# Patient Record
Sex: Female | Born: 1935 | Race: Black or African American | Hispanic: No | Marital: Married
Health system: Southern US, Community
[De-identification: ages and names within clinical notes are randomized; demographics above are authoritative.]

## PROBLEM LIST (undated history)

## (undated) ENCOUNTER — Emergency Department (HOSPITAL_COMMUNITY): Admission: EM | Payer: Medicare PPO

## (undated) DIAGNOSIS — I10 Essential (primary) hypertension: Secondary | ICD-10-CM

## (undated) DIAGNOSIS — E119 Type 2 diabetes mellitus without complications: Secondary | ICD-10-CM

## (undated) DIAGNOSIS — I480 Paroxysmal atrial fibrillation: Secondary | ICD-10-CM

## (undated) DIAGNOSIS — B029 Zoster without complications: Secondary | ICD-10-CM

## (undated) DIAGNOSIS — H409 Unspecified glaucoma: Secondary | ICD-10-CM

## (undated) DIAGNOSIS — K5792 Diverticulitis of intestine, part unspecified, without perforation or abscess without bleeding: Secondary | ICD-10-CM

## (undated) DIAGNOSIS — E7800 Pure hypercholesterolemia, unspecified: Secondary | ICD-10-CM

## (undated) HISTORY — PX: ABDOMINAL HYSTERECTOMY: SHX81

## (undated) HISTORY — DX: Unspecified glaucoma: H40.9

## (undated) HISTORY — DX: Paroxysmal atrial fibrillation: I48.0

## (undated) HISTORY — PX: COLONOSCOPY: SHX174

---

## 1997-11-07 ENCOUNTER — Ambulatory Visit (HOSPITAL_COMMUNITY): Admission: RE | Admit: 1997-11-07 | Discharge: 1997-11-07 | Payer: Self-pay | Admitting: Gastroenterology

## 1998-06-19 ENCOUNTER — Other Ambulatory Visit: Admission: RE | Admit: 1998-06-19 | Discharge: 1998-06-19 | Payer: Self-pay | Admitting: Family Medicine

## 1999-10-08 ENCOUNTER — Encounter (INDEPENDENT_AMBULATORY_CARE_PROVIDER_SITE_OTHER): Payer: Self-pay | Admitting: *Deleted

## 1999-10-08 ENCOUNTER — Encounter: Payer: Self-pay | Admitting: Family Medicine

## 1999-10-08 ENCOUNTER — Encounter: Admission: RE | Admit: 1999-10-08 | Discharge: 1999-10-08 | Payer: Self-pay | Admitting: Family Medicine

## 1999-11-03 ENCOUNTER — Other Ambulatory Visit: Admission: RE | Admit: 1999-11-03 | Discharge: 1999-11-03 | Payer: Self-pay | Admitting: Endocrinology

## 2000-07-23 ENCOUNTER — Ambulatory Visit (HOSPITAL_COMMUNITY): Admission: RE | Admit: 2000-07-23 | Discharge: 2000-07-23 | Payer: Self-pay | Admitting: Endocrinology

## 2000-07-23 ENCOUNTER — Encounter: Payer: Self-pay | Admitting: Endocrinology

## 2000-08-06 ENCOUNTER — Encounter (INDEPENDENT_AMBULATORY_CARE_PROVIDER_SITE_OTHER): Payer: Self-pay | Admitting: *Deleted

## 2000-08-06 ENCOUNTER — Ambulatory Visit (HOSPITAL_COMMUNITY): Admission: RE | Admit: 2000-08-06 | Discharge: 2000-08-06 | Payer: Self-pay | Admitting: Endocrinology

## 2000-08-06 ENCOUNTER — Encounter: Payer: Self-pay | Admitting: Endocrinology

## 2001-08-05 ENCOUNTER — Ambulatory Visit (HOSPITAL_COMMUNITY): Admission: RE | Admit: 2001-08-05 | Discharge: 2001-08-05 | Payer: Self-pay | Admitting: Endocrinology

## 2001-08-05 ENCOUNTER — Encounter: Payer: Self-pay | Admitting: Endocrinology

## 2002-05-11 ENCOUNTER — Ambulatory Visit (HOSPITAL_COMMUNITY): Admission: RE | Admit: 2002-05-11 | Discharge: 2002-05-11 | Payer: Self-pay | Admitting: Family Medicine

## 2002-05-11 ENCOUNTER — Encounter: Payer: Self-pay | Admitting: Family Medicine

## 2003-04-14 HISTORY — PX: TOTAL HIP ARTHROPLASTY: SHX124

## 2003-08-16 ENCOUNTER — Inpatient Hospital Stay (HOSPITAL_COMMUNITY): Admission: RE | Admit: 2003-08-16 | Discharge: 2003-08-22 | Payer: Self-pay | Admitting: Orthopedic Surgery

## 2003-09-27 ENCOUNTER — Encounter: Admission: RE | Admit: 2003-09-27 | Discharge: 2003-11-22 | Payer: Self-pay | Admitting: Orthopedic Surgery

## 2005-04-15 ENCOUNTER — Encounter: Admission: RE | Admit: 2005-04-15 | Discharge: 2005-04-15 | Payer: Self-pay | Admitting: Family Medicine

## 2006-08-10 ENCOUNTER — Encounter: Admission: RE | Admit: 2006-08-10 | Discharge: 2006-08-10 | Payer: Self-pay | Admitting: Family Medicine

## 2008-03-23 ENCOUNTER — Inpatient Hospital Stay (HOSPITAL_COMMUNITY): Admission: AD | Admit: 2008-03-23 | Discharge: 2008-03-26 | Payer: Self-pay | Admitting: Internal Medicine

## 2008-03-24 ENCOUNTER — Ambulatory Visit: Payer: Self-pay | Admitting: Internal Medicine

## 2008-03-26 ENCOUNTER — Encounter (INDEPENDENT_AMBULATORY_CARE_PROVIDER_SITE_OTHER): Payer: Self-pay | Admitting: Gastroenterology

## 2008-04-03 ENCOUNTER — Encounter: Admission: RE | Admit: 2008-04-03 | Discharge: 2008-04-03 | Payer: Self-pay | Admitting: Family Medicine

## 2010-04-09 ENCOUNTER — Ambulatory Visit (HOSPITAL_COMMUNITY)
Admission: RE | Admit: 2010-04-09 | Discharge: 2010-04-09 | Payer: Self-pay | Source: Home / Self Care | Attending: Ophthalmology | Admitting: Ophthalmology

## 2010-04-22 ENCOUNTER — Ambulatory Visit (HOSPITAL_COMMUNITY)
Admission: RE | Admit: 2010-04-22 | Discharge: 2010-04-22 | Payer: Medicare Other | Source: Home / Self Care | Attending: Ophthalmology | Admitting: Ophthalmology

## 2010-05-30 ENCOUNTER — Other Ambulatory Visit (HOSPITAL_COMMUNITY): Payer: Self-pay | Admitting: Ophthalmology

## 2010-05-30 ENCOUNTER — Ambulatory Visit (HOSPITAL_COMMUNITY)
Admission: RE | Admit: 2010-05-30 | Discharge: 2010-05-30 | Disposition: A | Discharge status: Home / Self Care | Payer: Medicare Other | Source: Ambulatory Visit | Attending: Ophthalmology | Admitting: Ophthalmology

## 2010-05-30 ENCOUNTER — Encounter (HOSPITAL_COMMUNITY)
Admission: RE | Admit: 2010-05-30 | Discharge: 2010-05-30 | Disposition: A | Discharge status: Home / Self Care | Payer: Medicare Other | Source: Ambulatory Visit | Attending: Ophthalmology | Admitting: Ophthalmology

## 2010-05-30 DIAGNOSIS — R079 Chest pain, unspecified: Secondary | ICD-10-CM | POA: Insufficient documentation

## 2010-05-30 DIAGNOSIS — I10 Essential (primary) hypertension: Secondary | ICD-10-CM | POA: Insufficient documentation

## 2010-05-30 DIAGNOSIS — Z01818 Encounter for other preprocedural examination: Secondary | ICD-10-CM | POA: Insufficient documentation

## 2010-05-30 LAB — BASIC METABOLIC PANEL
BUN: 12 mg/dL (ref 6–23)
CO2: 31 mEq/L (ref 19–32)
Calcium: 9.8 mg/dL (ref 8.4–10.5)
Chloride: 103 mEq/L (ref 96–112)
Creatinine, Ser: 0.8 mg/dL (ref 0.4–1.2)
GFR calc Af Amer: >60 mL/min (ref >60)
GFR calc non Af Amer: >60 mL/min (ref >60)
Glucose, Bld: 143 mg/dL — ABNORMAL HIGH (ref 70–99)
Potassium: 4.1 mEq/L (ref 3.5–5.1)
Sodium: 141 mEq/L (ref 135–145)

## 2010-05-30 LAB — CBC
HCT: 39.8 % (ref 36.0–46.0)
Hemoglobin: 14.3 g/dL (ref 12.0–15.0)
MCH: 33.3 pg (ref 26.0–34.0)
MCHC: 35.9 g/dL (ref 30.0–36.0)
MCV: 92.8 fL (ref 78.0–100.0)
Platelets: 162 10*3/uL (ref 150–400)
RBC: 4.29 MIL/uL (ref 3.87–5.11)
RDW: 12.3 % (ref 11.5–15.5)
WBC: 5.7 10*3/uL (ref 4.0–10.5)

## 2010-06-04 ENCOUNTER — Ambulatory Visit (HOSPITAL_COMMUNITY)
Admission: RE | Admit: 2010-06-04 | Discharge: 2010-06-04 | Disposition: A | Discharge status: Home / Self Care | Payer: Medicare Other | Source: Ambulatory Visit | Attending: Ophthalmology | Admitting: Ophthalmology

## 2010-06-04 DIAGNOSIS — H269 Unspecified cataract: Secondary | ICD-10-CM | POA: Insufficient documentation

## 2010-06-04 DIAGNOSIS — I10 Essential (primary) hypertension: Secondary | ICD-10-CM | POA: Insufficient documentation

## 2010-06-04 LAB — GLUCOSE, CAPILLARY
Glucose-Capillary: 110 mg/dL — ABNORMAL HIGH (ref 70–99)
Glucose-Capillary: 94 mg/dL (ref 70–99)
Glucose-Capillary: 111 mg/dL — ABNORMAL HIGH (ref 70–99)

## 2010-07-14 NOTE — Op Note (Signed)
[name], [name]                ACCOUNT NO.:  [id number]  MEDICAL RECORD NO.:  [id number]           PATIENT TYPE:  O  LOCATION:  SDSC                         FACILITY:  [facility]  PHYSICIAN:  Chalmers Guest, M.D.     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  There was a note that the patient's pupil was poorly dilated making it a complex cataract.  PROCEDURE:  Phacoemulsification with intraocular lens implant with devices to stretch the iris and open the bag.  COMPLICATIONS:  None.  ANESTHESIA:  Consisted of 2% Xylocaine and a 50/50 mixture with 0.75% Marcaine with an ampule of Wydase.  PROCEDURE:  The patient was given a peribulbar block in the operative suite under monitored anesthesia with the aforementioned local anesthetic agent.  Pressure was applied to the eye.  Following this, the patient's face was prepped and draped in the usual sterile fashion. With the lid speculum inserted and the surgeon sitting temporally and operating the microscope into position, a Weck-Cel sponge was used to fixate the globe and a 15-degree blade was used to enter the inferior temporal clear cornea.  Viscoat was injected in the eye.  Following this, an additional Weck-Cel sponge was used to fixate the eye and a 2.75-mm keratome blade was used in a stepwise fashion through temporal clear cornea.  A bent 25-gauge needle was used to incise the anterior capsule and a continuous tear curvilinear capsulorrhexis was formed. Utrata forceps were used to remove the anterior capsule.  BSS was used to hydrodissect and hydrodelineate the nucleus.  The phacoemulsification unit was then used to sculpt the nucleus.  The  nucleus was snapped into three quadrants and one quadrant easily was removed.  Following this, the remaining nuclear quadrant was removed to quadrant beneath the iris. It was difficult to see when the pupil constricted down.  A heavy viscoelastic was then injected to keep the pupillary margin open.  The remaining nuclear fragment was then removed and then the IA was called forward.  The IA was used to strip cortical fibers.  This as the posterior capsular bag was made clear, a line was noted in the capsular bag that appeared to be a tear in the posterior capsule.  Viscoelastic was injected.  Incision was checked for vitreous, there was no vitreous. An angle tip IA was then used to remove sub-incisional cortex.  At this point, it was clear that the posterior capsular bag was intact, but the capsulorrhexis was not continuously intact at this point.  Therefore, the AMO lens was chosen for insertion in the ciliary sulcus.  The lens was an AMO model V9002 lens, 20.5 diopter lens.  The lens was folded and that there was no cartridge available to place it in the lens injector. Therefore, it was folded with the forceps.  The incision was extended to 3.5 mm.  The lens was inserted  and then placed in the pupillary margin. The Sinskey hook was used to position the lens in the ciliary sulcus and the lens was well centered.  The IA was used to remove viscoelastic from the eye.  The pupil at this point again was down to about 3-4 mm.  With the lens in good position, a single 10-0 nylon suture was placed to achieve watertight closure.  The eye was pressurized and there being no leakage, all instrumentation removed from the eye.  TobraDex ointment was applied to the eye.  Patch and Fox shield were placed and the patient returned to recovery area in stable condition.     Chalmers Guest, M.D.     RW/MEDQ  D:  06/04/2010  T:  06/05/2010  Job:  [id number]  Electronically Signed by Chalmers Guest M.D. on  07/14/2010 02:03:24 PM

## 2010-08-26 NOTE — Discharge Summary (Signed)
Hannah, [name]                ACCOUNT NO.:  [id number]   MEDICAL RECORD NO.:  [id number]          PATIENT TYPE:  INP   LOCATION:  1432                         FACILITY:  [facility]   PHYSICIAN:  Monte Fantasia, MD  DATE OF BIRTH:  12-Nov-1928   DATE OF ADMISSION:  03/23/2008  DATE OF DISCHARGE:                               DISCHARGE SUMMARY   DISCHARGE DIAGNOSES:  1. Bright red blood per rectum.  2. Status post colonoscopy, possibly diverticular bleed.  3. Hypertension.  4. Degenerative disease.  5. Diabetes.  6. Glaucoma.  7. Hyperlipidemia.  8. History of chronic anemia.   DISCHARGE MEDICATIONS:  1. Januvia 100 mg p.o. daily.  2. Simvastatin 20 mg p.o. daily, metformin 500 mg p.o. twice daily.  3. Norvasc 5 mg daily.  4. Travatan .004% one drop each eye at bed time.  5. Alphagan 0.2% both eyes one drop twice daily.   COURSE DURING THE HOSPITAL STAY:  A 77 year old African American female  patient presented to primary care physician's office with bright red  blood per rectum.  The patient did not have any complaints of dizziness  upon ambulation.  No complaints of abdominal pain.  The patient also had  history of passing black tarry stools.  The patient was admitted and  monitored for her H&H which remained stable.  Underwent a colonoscopy on  December 14 which showed a sessile polyp, diverticuli and medium  hemorrhoids.  Biopsy was done with colonoscopy and the patient is  recommended to follow up with Dr. Loreta Ave in 1-2 weeks.  The patient is  stable to be discharged and can be discharged home today.   RADIOLOGY:  Investigations done during hospital stay is none.   DISCHARGE LABORATORIES:  Total WBC 5.8, hemoglobin 10.8, hematocrit  32.1, platelet of 136, sodium 140, potassium 3.7, chloride 109, bicarb  25, glucose 128, BUN 9, creatinine 0.6, magnesium 2.1, phosphorus 4.3.   ASSESSMENT AND PLAN:  Plan to discharge the patient today and will  recommend her to follow up  with her primary care physician in one week  and with GI, Dr. Loreta Ave in 1-2 weeks.     Monte Fantasia, MD  Electronically Signed    MP/MEDQ  D:  03/26/2008  T:  03/26/2008  Job:  [id number]

## 2010-08-26 NOTE — H&P (Signed)
[name], [name]                ACCOUNT NO.:  [id number]   MEDICAL RECORD NO.:  [id number]          PATIENT TYPE:  INP   LOCATION:  1432                         FACILITY:  [facility]   PHYSICIAN:  Lucita Ferrara, MD         DATE OF BIRTH:  1928/05/30   DATE OF ADMISSION:  03/23/2008  DATE OF DISCHARGE:                              HISTORY & PHYSICAL   CHIEF COMPLAINT:  Rectal bleeding.  The patient is a 77 year old African  American female who presented to primary care physician's office with  one episodes of bright red blood per rectum.  The patient did not have  any dizziness upon ambulation.  The patient denies any abdominal pain.  The patient has had colonoscopy 7 years ago, routine, which was negative  for polyps.  The patient denies excessive use of anti-inflammatory  medication, NSAIDS.  She routinely takes aspirin 81 mg daily.  She  denies nausea, vomiting, hematemesis.  She denies rectal pain.  She was  sent here after Dr. Parke Simmers performed rectal exam and found significant  amount of blood per rectum.  There were no hemorrhoids noted.   Otherwise the patient's review of systems negative.  She denies fevers,  chills, abdominal pain, diarrhea.   PAST MEDICAL HISTORY:  1. Significant for type 2 diabetes.  2. Hypertension.  3. Degenerative joint disease.  4. Hypercholesteremia.  5. Documented history of chronic anemia.  6. Glaucoma.   PREVIOUS OPERATIONS:  Status post hysterectomy.   ALLERGIES:  PENICILLIN.   SOCIAL HISTORY:  The patient lives with her husband.  Social drinker.  Denies drugs or alcohol.   MEDICATIONS:  Are yet to be consolidated at this point.Marland Kitchen   FAMILY HISTORY:  Noncontributory.   PHYSICAL EXAMINATION:  GENERAL:  Generally speaking, she is in no acute  distress.  VITAL SIGNS:  Temperature 98, pulse 73, respirations 18, blood pressure  161/83, pulse ox 99% on room air.  HEENT: Normocephalic, atraumatic.  Sclerae anicteric.  Neck supple.  No  JVD, no  carotid bruits.  Extraocular muscles intact.  CARDIOVASCULAR:  S1, S2, regular rate and rhythm.  No murmurs, rubs or  clicks.  LUNGS:  Clear to auscultation bilaterally, without rales or wheezes.  ABDOMEN:  Soft, nontender, nondistended.  Positive bowel sounds.  EXTREMITIES:  No weakness or edema.  RECTAL:  Stool black.  No hemorrhoids noted.  Guaiac positive.   LABORATORY DATA:  Laboratory data pending.  Complete metabolic panel  pending.  Electrolytes pending.  Radiological data is pending.  Note,  the patient was direct admission.   DISCUSSION:  The patient is 77 year old with:   1  Bright red blood per rectum.  No rectal pain.  No abdominal pain.  Sent here from primary care physician's office.  No laboratory data.  1. History of chronic anemia, history of negative colonoscopy 7 years      ago, per patient.  2. Hypertension.  3. Degenerative joint disease.  4. Diabetes, type 2.  5. Glaucoma.  6. Hyperlipidemia.   Patient will be admitted to the medical telemetry unit.  If patient's  hemoglobin has significantly dropped or the patient is hemodynamically  unstable, there will be a transition to step-down unit.  The patient  will be initiated on anemia workup.  Anemia panel, stool for occult  blood x3, hemoglobin and hematocrit every 6 hours x24 hours.  The  patient will be typed and crossed and 1 unit of packed red blood cells  will be held.  Transfuse as needed.  GI consultation will proceed.  The  patient will be initiated on Protonix 40 mg IV twice daily.  Avoid  nonsteroidal anti-inflammatory medications.  For hypertension we will continue blood pressure medications by the  patient's son.  For diabetes we will hold metformin and will initiate  sliding scale insulin.  For glaucoma, she needs to bring her eye drops  in.  DVT and GI prophylaxis routine.We will hold any Lovenox until  source of GI bleed has been delineated.  The rest of pituitary rongeurs  plans are dependent  on her progress and consultant recommendations.      Lucita Ferrara, MD  Electronically Signed     RR/MEDQ  D:  03/23/2008  T:  03/23/2008  Job:  [id number]

## 2010-08-26 NOTE — Consult Note (Signed)
[name], [name]                ACCOUNT NO.:  [id number]   MEDICAL RECORD NO.:  [id number]          PATIENT TYPE:  INP   LOCATION:  1432                         FACILITY:  [facility]   PHYSICIAN:  Jordan Hawks. Elnoria Howard, MD    DATE OF BIRTH:  1929/02/21   DATE OF CONSULTATION:  03/23/2008  DATE OF DISCHARGE:                                 CONSULTATION   PRIMARY CARE [facility]:  Renaye Rakers, M.D.   HISTORY OF PRESENT ILLNESS:  This is a 77 year old female with a past  medical history of hypertension, diabetes, history of chronic anemia,  hyperlipidemia, DJD, status post right hip replacement, glaucoma, and a  hysterectomy who was admitted to the hospital with complaints of  hematochezia.  The patient reports that it started yesterday morning.  She had one episode and subsequently did not have any further bleeding  at that time.  However this morning, she did have another episode of  bleeding and she thought it had more blood with her stool during this  event.  Subsequently, she presented to Dr. Parke Simmers for further evaluation  and treatment.  The rectal examination performed and did reveal maroon  stool.  Then she was recommended to present to the hospital for further  evaluation and treatment.  The patient denies having any abdominal pain,  proctalgia, nausea, vomiting or diarrhea.  She was in her usual state of  health before this event.  In 1999, she underwent a colonoscopy by Dr.  Loreta Ave with findings of left-sided diverticulosis and hemorrhoids.   PAST MEDICAL/SURGICAL HISTORY:  As stated above.   FAMILY HISTORY:  Noncontributory.   SOCIAL HISTORY:  The patient lives with her husband.  Occasional  alcohol.  No tobacco or illicit drug use.   REVIEW OF SYSTEMS:  As stated above in the history of present illness,  otherwise negative.   ALLERGIES:  PENICILLIN.   MEDICATIONS:  See the inpatient medicine list.   PHYSICAL EXAMINATION:  VITAL SIGNS:  Pending at this time.  GENERAL:  The  patient is in no acute distress, alert and oriented.  HEENT:  Normocephalic, atraumatic.  Extraocular muscles intact.  NECK:  Supple.  No lymphadenopathy.  LUNGS:  Clear to auscultation bilaterally.  CARDIOVASCULAR:  Regular rate and rhythm.  ABDOMEN:  Mildly obese, soft, nontender, nondistended.  Positive bowel  sounds.  EXTREMITIES:  No clubbing, cyanosis or edema.  RECTAL:  Significant for maroon stool.   LABORATORY DATA:  Laboratory values pending at this time.   IMPRESSION:  1. Diverticular bleed.  2. Multiple medical problems.   PLAN:  At the time, I feel that the patient's clinical history is  consistent with a diverticular bleed.  She is stable.  No complaints of  any chest pain, shortness breath or dizziness.  She only had 2 bowel  movements.  Overall, the patient appears be stable.  Plan is to monitor  her hemoglobin q.12 h. and then daily if the hemoglobin remains stable.  Transfusion can be provided if necessary.  If the bleeding does persist,  then a colonoscopy can be pursued at this time.  Jordan Hawks Elnoria Howard, MD  Electronically Signed     PDH/MEDQ  D:  03/23/2008  T:  03/24/2008  Job:  [id number]   cc:   Renaye Rakers, M.D.  Fax: [phone]

## 2010-08-29 NOTE — H&P (Signed)
NAME:  [name], [name]                          ACCOUNT NO.:  [id number]   MEDICAL RECORD NO.:  [id number]                   PATIENT TYPE:  INP   LOCATION:  5015                                 FACILITY:  [facility]   PHYSICIAN:  Myrtie Neither, M.D.                 DATE OF BIRTH:  May 02, 1928   DATE OF ADMISSION:  08/16/2003  DATE OF DISCHARGE:  08/22/2003                                HISTORY & PHYSICAL   CHIEF COMPLAINT:  Painful right hip.   HISTORY OF PRESENT ILLNESS:  This is a 77 year old black female who had been  followed for severe degenerative joint disease involving her right hip.  The  patient had been treated with anti-inflammatories, use of quad cane with  progressive worsening of pain, disability, and loss of function.   PAST MEDICAL HISTORY:  1. High blood pressure.  2. Degenerative joint disease.  3. Hypercholesterolemia.  4. Chronic anemia.  5. Diabetes mellitus.  6. Glaucoma.   OPERATIONS:  Hysterectomy.   ALLERGIES:  PENICILLIN.   SOCIAL HISTORY:  The patient lives with her husband.  Has occasional use of  alcohol.  Denies use of tobacco or smoking.   REVIEW OF SYSTEMS:  Basically, that of history of present illness.  Also,  degenerative joint pain in bilateral knees.  No cardiac or respiratory.  No  urinary or bowel symptoms.   MEDICATIONS:  1. Zocor 20 mg daily.  2. Norvasc 5 mg.  3. Uniretic 15/12.5 one-half tablet q.a.m.  4. Metformin ER 500 mg b.i.d.  5. __________ 5 mg q.a.m.  6. Percocet 5 mg.  7. Avandia 4 mg b.i.d.  8. Ultram q.6h. p.r.n.  9. Mobic 15 mg daily.  10.      Alphagan eye drops t.i.d.  11.      Xalatan 0.005 at bedtime.  12.      Multivitamin.  13.      Vitamin E.  14.      Ferrous sulfate.  15.      Garlic.  16.      Synthroid 0.1 mg.  17.      Cosopt eye drop b.i.d.   FAMILY HISTORY:  Noncontributory.   PHYSICAL EXAMINATION:  VITAL SIGNS:  Temperature 98.2, pulse 70,  respirations 18, blood pressure 139/72.  Height 59  inches.  Weight 141  pounds.  HEENT:  Normocephalic.  Sclerae are clear.  NECK:  Supple.  CHEST:  Clear.  CARDIAC:  S1 and S2 are regular.  EXTREMITIES:  The patient has marked limp on the right side with obvious  shortening of the right side.  Range of motion of the right hip is limited  by pain on attempt at rotation, as well as flexion, with palpable and  audible click on flexion of the hip.  Neurovascular status is intact.   X-ray reveals severe degenerative joint change involving the right femoral  head and acetabulum.  IMPRESSION:  1. Degenerative joint disease, right hip.  2. High blood pressure.  3. Hypercholesterolemia.  4. Diabetes mellitus.   PLAN:  Right total hip arthroplasty.                                                Myrtie Neither, M.D.    AC/MEDQ  D:  09/25/2003  T:  09/25/2003  Job:  [id number]

## 2010-08-29 NOTE — Discharge Summary (Signed)
NAME:  [name], [name]                          ACCOUNT NO.:  [id number]   MEDICAL RECORD NO.:  [id number]                   PATIENT TYPE:  INP   LOCATION:  5015                                 FACILITY:  [facility]   PHYSICIAN:  Myrtie Neither, M.D.                 DATE OF BIRTH:  [birth year]   DATE OF ADMISSION:  08/16/2003  DATE OF DISCHARGE:  08/22/2003                                 DISCHARGE SUMMARY   ADMISSION DIAGNOSES:  Severe degenerative arthropathy, right hip.   DISCHARGE DIAGNOSES:  Severe degenerative arthropathy, right hip.   COMPLICATIONS:  Infected central line tip with bacteremia.   OPERATION:  Right total hip arthroplasty.   PERTINENT HISTORY:  This is a 77 year old female who had been followed in  the office for degenerative joint disease with progressive worsening of pain  and dysfunction.  Pertinent physical was that of the right hip with shortening, limited range  of motion, tender on attempted range of motion. Neurovascular status was  intact.  X-rays revealed  loss of joint space with osteophytes and  sclerosis.   HOSPITAL COURSE:  The patient had preop medical evaluation by her primary  care physician and was found to be stable enough to undergo surgery by Renaye Rakers, M.D.  The patient had preop laboratories, CBC's, CMET, PT, PTT, EKG,  chest x-ray which were all stable enough for the patient to undergo surgery.  The patient underwent right total hip arthroplasty on Aug 16, 2003.  Postop  course had almost immediate spike of temperature. The patient's temperature  within 48 hours postop of 102, 103.  The patient had blood cultures done,  removal of the central line and culturing of the central line tip. The  patient's was already on prophylactic Ancef.  Culture reports revealed the  central line tip had staph, coagulase negative. The patient immediately  became afebrile within a two day period.  Infectious disease was consulted  and the patient was felt not to  be in danger of worsening of condition and  was afebrile and antibiotics were felt not to be necessary upon discharge.  The patient progressed very well with her physical therapy with very good  ambulatory skills. The patient was able to be discharged home with home  health physical therapy, Percocet for pain control, continued on Coumadin  and to return to her other general medical medications.  The patient was  discharged in stable and satisfactory condition and to return to the office  in one week.                                                Myrtie Neither, M.D.    AC/MEDQ  D:  10/16/2003  T:  10/17/2003  Job:  [id number]

## 2010-08-29 NOTE — Op Note (Signed)
NAME:  [name], [name]                          ACCOUNT NO.:  [id number]   MEDICAL RECORD NO.:  [id number]                   PATIENT TYPE:  INP   LOCATION:  5015                                 FACILITY:  [facility]   PHYSICIAN:  Myrtie Neither, M.D.                 DATE OF BIRTH:  Mar 15, 1929   DATE OF PROCEDURE:  08/16/2003  DATE OF DISCHARGE:  08/22/2003                                 OPERATIVE REPORT   PREOPERATIVE DIAGNOSES:  Severe degenerative arthritis, right hip.   POSTOPERATIVE DIAGNOSES:  Severe degenerative arthritis, right hip.   ANESTHESIA:  General.   PROCEDURE:  Right total hip arthroplasty Depuy.   DESCRIPTION OF PROCEDURE:  The patient was taken to the operating room after  given adequate preop medication. The patient was placed in the right lateral  position, right hip was prepped with Duraprep and draped in a sterile  manner.  A posterior southern approach was made over the hip going through  the skin and subcutaneous tissue down to the fascia lata, short rotators  were identified and released.  The hip was internally rotated, the capsule  was identified and resected. The femoral head was dislocated followed by  placement of the femoral neck cutting jig. This was outlined with the use of  a Bovie. The oscillating saw was used to resect the femoral head.  Next, the  acetabulum was exposed and further capsular tissue resected.  Reaming of the  acetabulum was done for a small size 52 mm after  adequate reaming down to  good bleeding bone. A trial component was put in place and felt to hold good  and stable.  Next attention was turned to the femoral neck.  Reaming on the  femoral canal was started with a cookie starter followed by the guide rasp  and reaming down the femoral canal for a size 12 implant.  After adequate  reaming, the rasp in place, femoral head and neck components were tried, a  32 mm head was plus 1 and was found to be good and stable with good full  flexion and extension, no telescoping, good internal and external rotation  and leg length.  The patient had previous leg length shortening on that side  and we were able to regain some of that.  Next copious and abundant  irrigation was done. Following placement of the components, the acetabular  cuff was small size 52 mm, porous coated. There was good stability.  This  was further stabilized with the use of cancellous screws, three screws.  Femoral component was 12 mm, small stature. This was hammered in place and  felt to have good grasping of the bone. A 10 degree acetabular liner was  then snapped in place and trial head and neck components were again tested  and the 32 mm plus 1 was found to be the most stable.  With the __________  in  place including a hole eliminator for the acetabulum, copious irrigation  was done, range of motion was found to be good with good stability and good  leg length.  Wound closure was then done with #0 Vicryl for the fascia, 2-0  for the subcutaneous and skin staples in the skin.  A pressure dressing was  applied, hip abduction pillows applied.  The patient tolerated the procedure  quite well and went to the recovery room in stable satisfactory condition.                                               Myrtie Neither, M.D.    AC/MEDQ  D:  10/16/2003  T:  10/17/2003  Job:  [id number]

## 2010-09-16 ENCOUNTER — Encounter (HOSPITAL_COMMUNITY)
Admission: RE | Admit: 2010-09-16 | Discharge: 2010-09-16 | Disposition: A | Discharge status: Home / Self Care | Payer: Medicare Other | Source: Ambulatory Visit | Attending: Ophthalmology | Admitting: Ophthalmology

## 2010-09-16 LAB — CBC
HCT: 38.5 % (ref 36.0–46.0)
Hemoglobin: 13.6 g/dL (ref 12.0–15.0)
MCH: 32.9 pg (ref 26.0–34.0)
MCHC: 35.3 g/dL (ref 30.0–36.0)
MCV: 93.2 fL (ref 78.0–100.0)
Platelets: 136 10*3/uL — ABNORMAL LOW (ref 150–400)
RBC: 4.13 MIL/uL (ref 3.87–5.11)
RDW: 12.6 % (ref 11.5–15.5)
WBC: 4.8 10*3/uL (ref 4.0–10.5)

## 2010-09-16 LAB — BASIC METABOLIC PANEL
BUN: 15 mg/dL (ref 6–23)
CO2: 29 mEq/L (ref 19–32)
Calcium: 9.6 mg/dL (ref 8.4–10.5)
Chloride: 103 mEq/L (ref 96–112)
Creatinine, Ser: 0.8 mg/dL (ref 0.4–1.2)
GFR calc Af Amer: >60 mL/min (ref >60)
GFR calc non Af Amer: >60 mL/min (ref >60)
Glucose, Bld: 191 mg/dL — ABNORMAL HIGH (ref 70–99)
Potassium: 4.5 mEq/L (ref 3.5–5.1)
Sodium: 142 mEq/L (ref 135–145)

## 2010-09-17 ENCOUNTER — Ambulatory Visit (HOSPITAL_COMMUNITY)
Admission: RE | Admit: 2010-09-17 | Discharge: 2010-09-17 | Disposition: A | Discharge status: Home / Self Care | Payer: Medicare Other | Source: Ambulatory Visit | Attending: Ophthalmology | Admitting: Ophthalmology

## 2010-09-17 DIAGNOSIS — I10 Essential (primary) hypertension: Secondary | ICD-10-CM | POA: Insufficient documentation

## 2010-09-17 DIAGNOSIS — H269 Unspecified cataract: Secondary | ICD-10-CM | POA: Insufficient documentation

## 2010-09-17 DIAGNOSIS — E119 Type 2 diabetes mellitus without complications: Secondary | ICD-10-CM | POA: Insufficient documentation

## 2010-09-17 DIAGNOSIS — Z01812 Encounter for preprocedural laboratory examination: Secondary | ICD-10-CM | POA: Insufficient documentation

## 2010-09-17 LAB — GLUCOSE, CAPILLARY
Glucose-Capillary: 146 mg/dL — ABNORMAL HIGH (ref 70–99)
Glucose-Capillary: 151 mg/dL — ABNORMAL HIGH (ref 70–99)

## 2010-10-01 NOTE — Op Note (Signed)
  [name], [name]                ACCOUNT NO.:  [id number]  MEDICAL RECORD NO.:  [id number]  LOCATION:  SDSC                         FACILITY:  [facility]  PHYSICIAN:  Shade Flood, MD       DATE OF BIRTH:  11/26/1928  DATE OF PROCEDURE:  09/17/2010 DATE OF DISCHARGE:  09/17/2010                              OPERATIVE REPORT   PREOPERATIVE DIAGNOSIS:  Cataract, right eye.  POSTOPERATIVE DIAGNOSIS:  Cataract, right eye.  PROCEDURE PERFORMED:  Phacoemulsification with posterior chamber intraocular lens, right eye.  The patient was prepared and draped in the usual fashion for ocular surgery on the right eye and a solid lid speculum was placed.  A peripheral clear corneal incision was made for 3.5 mm centered at the 11 o'clock meridian.  A separate stab incision was made at the 2:30 meridian of the clear cornea.  A keratome was used to make a self- sealing incision at the 11 o'clock meridian and then Provisc was instilled into the anterior chamber.  A bent 25-gauge needle was used to perform a capsulorrhexis.  The lens was then hydrodissected using balanced salt solution and the Chang chopper was used to rotate the nucleus within the capsule bag to ensure adequate mobility.  The phacoemulsification handpiece and Chang chopper were then inserted and a combined phaco chop technique was employed fracturing the lens into sections with subsequent removal with the phaco handpiece.  The IA cannula was used to remove viscoelastic.    Provisc was instilled into the anterior chamber and the Monarch injector  was used to place a folded AcrySof MA50-BM lens into the capsule bag.  The angled Sinskey lens hook was then used to dial the intraocular lens so that the haptics were in the horizontal position.  The IA cannula was then used to remove additional cortex from the capsule and the viscoelastic from the anterior chamber.  The wound was reapproximated with one 10-0 nylon suture at the 11  o'clock meridian of the incision.  The patient was given 25 mg of Vancomycin subconjunctivally and 4 mg of Decadron subconjunctivally.  The lid speculum and drapes were removed and her eye was patched with polymyxin bacitracin ointment.  A plastic shield was placed and she was transferred alert and conversant from the operating room to the postoperative recovery area.          ______________________________ Shade Flood, MD     GG/MEDQ  D:  09/17/2010  T:  09/18/2010  Job:  [id number]  Electronically Signed by Shade Flood MD on 10/01/2010 03:45:02 PM

## 2011-01-16 LAB — CROSSMATCH
ABO/RH(D): O NEG
Antibody Screen: NEGATIVE

## 2011-01-16 LAB — CBC
HCT: 31.9 % — ABNORMAL LOW (ref 36.0–46.0)
HCT: 32.1 % — ABNORMAL LOW (ref 36.0–46.0)
HCT: 33.6 % — ABNORMAL LOW (ref 36.0–46.0)
HCT: 35.6 % — ABNORMAL LOW (ref 36.0–46.0)
Hemoglobin: 10.8 g/dL — ABNORMAL LOW (ref 12.0–15.0)
Hemoglobin: 10.9 g/dL — ABNORMAL LOW (ref 12.0–15.0)
Hemoglobin: 11.5 g/dL — ABNORMAL LOW (ref 12.0–15.0)
Hemoglobin: 12 g/dL (ref 12.0–15.0)
MCHC: 33.6 g/dL (ref 30.0–36.0)
MCHC: 33.6 g/dL (ref 30.0–36.0)
MCHC: 34.1 g/dL (ref 30.0–36.0)
MCHC: 34.3 g/dL (ref 30.0–36.0)
MCV: 100 fL (ref 78.0–100.0)
MCV: 101.6 fL — ABNORMAL HIGH (ref 78.0–100.0)
MCV: 99.7 fL (ref 78.0–100.0)
MCV: 99.8 fL (ref 78.0–100.0)
Platelets: 129 10*3/uL — ABNORMAL LOW (ref 150–400)
Platelets: 135 10*3/uL — ABNORMAL LOW (ref 150–400)
Platelets: 136 10*3/uL — ABNORMAL LOW (ref 150–400)
Platelets: 146 10*3/uL — ABNORMAL LOW (ref 150–400)
RBC: 3.16 MIL/uL — ABNORMAL LOW (ref 3.87–5.11)
RBC: 3.19 MIL/uL — ABNORMAL LOW (ref 3.87–5.11)
RBC: 3.37 MIL/uL — ABNORMAL LOW (ref 3.87–5.11)
RBC: 3.57 MIL/uL — ABNORMAL LOW (ref 3.87–5.11)
RDW: 12.9 % (ref 11.5–15.5)
RDW: 13 % (ref 11.5–15.5)
RDW: 13 % (ref 11.5–15.5)
RDW: 13 % (ref 11.5–15.5)
WBC: 4.6 10*3/uL (ref 4.0–10.5)
WBC: 5.6 10*3/uL (ref 4.0–10.5)
WBC: 5.6 10*3/uL (ref 4.0–10.5)
WBC: 5.8 10*3/uL (ref 4.0–10.5)

## 2011-01-16 LAB — ABO/RH: ABO/RH(D): O NEG

## 2011-01-16 LAB — COMPREHENSIVE METABOLIC PANEL
ALT: 30 U/L (ref 0–35)
AST: 31 U/L (ref 0–37)
Albumin: 4 g/dL (ref 3.5–5.2)
Alkaline Phosphatase: 56 U/L (ref 39–117)
BUN: 16 mg/dL (ref 6–23)
CO2: 25 mEq/L (ref 19–32)
Calcium: 9.4 mg/dL (ref 8.4–10.5)
Chloride: 106 mEq/L (ref 96–112)
Creatinine, Ser: 0.72 mg/dL (ref 0.4–1.2)
GFR calc Af Amer: >60 mL/min (ref >60)
GFR calc non Af Amer: >60 mL/min (ref >60)
Glucose, Bld: 120 mg/dL — ABNORMAL HIGH (ref 70–99)
Potassium: 4.5 mEq/L (ref 3.5–5.1)
Sodium: 138 mEq/L (ref 135–145)
Total Bilirubin: 1.6 mg/dL — ABNORMAL HIGH (ref 0.3–1.2)
Total Protein: 6.6 g/dL (ref 6.0–8.3)

## 2011-01-16 LAB — GLUCOSE, CAPILLARY
Glucose-Capillary: 115 mg/dL — ABNORMAL HIGH (ref 70–99)
Glucose-Capillary: 124 mg/dL — ABNORMAL HIGH (ref 70–99)
Glucose-Capillary: 129 mg/dL — ABNORMAL HIGH (ref 70–99)
Glucose-Capillary: 135 mg/dL — ABNORMAL HIGH (ref 70–99)
Glucose-Capillary: 139 mg/dL — ABNORMAL HIGH (ref 70–99)
Glucose-Capillary: 150 mg/dL — ABNORMAL HIGH (ref 70–99)
Glucose-Capillary: 181 mg/dL — ABNORMAL HIGH (ref 70–99)
Glucose-Capillary: 194 mg/dL — ABNORMAL HIGH (ref 70–99)
Glucose-Capillary: 197 mg/dL — ABNORMAL HIGH (ref 70–99)
Glucose-Capillary: 90 mg/dL (ref 70–99)

## 2011-01-16 LAB — BASIC METABOLIC PANEL
BUN: 9 mg/dL (ref 6–23)
CO2: 25 mEq/L (ref 19–32)
Calcium: 8.6 mg/dL (ref 8.4–10.5)
Chloride: 109 mEq/L (ref 96–112)
Creatinine, Ser: 0.64 mg/dL (ref 0.4–1.2)
GFR calc Af Amer: >60 mL/min (ref >60)
GFR calc non Af Amer: >60 mL/min (ref >60)
Glucose, Bld: 128 mg/dL — ABNORMAL HIGH (ref 70–99)
Potassium: 3.7 mEq/L (ref 3.5–5.1)
Sodium: 140 mEq/L (ref 135–145)

## 2011-01-16 LAB — MAGNESIUM: Magnesium: 2.1 mg/dL (ref 1.5–2.5)

## 2011-01-16 LAB — PHOSPHORUS: Phosphorus: 4.3 mg/dL (ref 2.3–4.6)

## 2011-11-05 ENCOUNTER — Other Ambulatory Visit: Payer: Self-pay | Admitting: Family Medicine

## 2011-11-05 ENCOUNTER — Ambulatory Visit
Admission: RE | Admit: 2011-11-05 | Discharge: 2011-11-05 | Disposition: A | Discharge status: Home / Self Care | Payer: Medicare Other | Source: Ambulatory Visit | Attending: Family Medicine | Admitting: Family Medicine

## 2011-11-05 DIAGNOSIS — W19XXXA Unspecified fall, initial encounter: Secondary | ICD-10-CM

## 2012-02-16 ENCOUNTER — Other Ambulatory Visit: Payer: Self-pay | Admitting: Family Medicine

## 2012-02-16 DIAGNOSIS — Z1231 Encounter for screening mammogram for malignant neoplasm of breast: Secondary | ICD-10-CM

## 2012-04-01 ENCOUNTER — Ambulatory Visit
Admission: RE | Admit: 2012-04-01 | Discharge: 2012-04-01 | Disposition: A | Discharge status: Home / Self Care | Payer: Medicare Other | Source: Ambulatory Visit | Attending: Family Medicine | Admitting: Family Medicine

## 2012-04-01 DIAGNOSIS — Z1231 Encounter for screening mammogram for malignant neoplasm of breast: Secondary | ICD-10-CM

## 2012-06-24 ENCOUNTER — Encounter (HOSPITAL_COMMUNITY): Payer: Self-pay | Admitting: Emergency Medicine

## 2012-06-24 ENCOUNTER — Emergency Department (HOSPITAL_COMMUNITY)
Admission: EM | Admit: 2012-06-24 | Discharge: 2012-06-24 | Disposition: A | Discharge status: Home / Self Care | Payer: Medicare PPO | Attending: Emergency Medicine | Admitting: Emergency Medicine

## 2012-06-24 ENCOUNTER — Emergency Department (HOSPITAL_COMMUNITY): Payer: Medicare PPO

## 2012-06-24 DIAGNOSIS — I10 Essential (primary) hypertension: Secondary | ICD-10-CM | POA: Insufficient documentation

## 2012-06-24 DIAGNOSIS — R1110 Vomiting, unspecified: Secondary | ICD-10-CM | POA: Insufficient documentation

## 2012-06-24 DIAGNOSIS — E7800 Pure hypercholesterolemia, unspecified: Secondary | ICD-10-CM | POA: Insufficient documentation

## 2012-06-24 DIAGNOSIS — E119 Type 2 diabetes mellitus without complications: Secondary | ICD-10-CM | POA: Insufficient documentation

## 2012-06-24 DIAGNOSIS — Z79899 Other long term (current) drug therapy: Secondary | ICD-10-CM | POA: Insufficient documentation

## 2012-06-24 HISTORY — DX: Essential (primary) hypertension: I10

## 2012-06-24 HISTORY — DX: Pure hypercholesterolemia, unspecified: E78.00

## 2012-06-24 HISTORY — DX: Type 2 diabetes mellitus without complications: E11.9

## 2012-06-24 LAB — BASIC METABOLIC PANEL
BUN: 13 mg/dL (ref 6–23)
CO2: 26 mEq/L (ref 19–32)
Calcium: 9 mg/dL (ref 8.4–10.5)
Chloride: 105 mEq/L (ref 96–112)
Creatinine, Ser: 0.74 mg/dL (ref 0.50–1.10)
GFR calc Af Amer: >90 mL/min (ref >90)
GFR calc non Af Amer: 78 mL/min — ABNORMAL LOW (ref >90)
Glucose, Bld: 230 mg/dL — ABNORMAL HIGH (ref 70–99)
Potassium: 4.2 mEq/L (ref 3.5–5.1)
Sodium: 141 mEq/L (ref 135–145)

## 2012-06-24 LAB — POCT I-STAT TROPONIN I: Troponin i, poc: 0.01 ng/mL (ref 0.00–0.08)

## 2012-06-24 LAB — CBC
HCT: 36.5 % (ref 36.0–46.0)
Hemoglobin: 12.6 g/dL (ref 12.0–15.0)
MCH: 32.1 pg (ref 26.0–34.0)
MCHC: 34.5 g/dL (ref 30.0–36.0)
MCV: 93.1 fL (ref 78.0–100.0)
Platelets: 141 10*3/uL — ABNORMAL LOW (ref 150–400)
RBC: 3.92 MIL/uL (ref 3.87–5.11)
RDW: 12.4 % (ref 11.5–15.5)
WBC: 4.3 10*3/uL (ref 4.0–10.5)

## 2012-06-24 NOTE — ED Notes (Signed)
Patient states she had chest pain at 0800 today.  Patient described as a squeezing pain mid chest "i felt like I was choking" with no radiation.  Patient states she did throw up and the pain went away immediately after that.   Patient did take 325 of ASA at home PTA.   Patient denied diaphoresis or SOB.

## 2012-06-24 NOTE — ED Notes (Signed)
IV removed from left hand

## 2012-06-24 NOTE — ED Notes (Signed)
Patient is resting comfortably.Patient was placed in grown on arival

## 2012-06-24 NOTE — ED Provider Notes (Signed)
History     CSN: [id number]  Arrival date & time 06/24/12  4098   First MD Initiated Contact with Patient 06/24/12 [phone]      Chief Complaint  Patient presents with  . Chest Pain    (Consider location/radiation/quality/duration/timing/severity/associated sxs/prior treatment) HPI Comments: [name] is a 79 y.o.   female, who is here for evaluation of an episode of choking  this morning.. she describes a choking as a feeling that she could not catch her breath, and was followed by an episode of vomiting, which caused the choking sensation to resolve. The symptoms occurred, and she was eating. She does not feel that she had any problems swallowing, or that she had accidentally inhaled, food. She has had no recurrence of nausea, vomiting; or preceding GI symptoms. She has been stooling well. She denies fever, chills, chest pain, dysuria, urinary frequency, weakness, or dizziness. She has had complete resolution of her problem. There are no known modifying factors.  Patient is a 79 y.o. female presenting with chest pain. The history is provided by the patient.  Chest Pain   Past Medical History  Diagnosis Date  . Diabetes mellitus without complication   . Hypertension   . Hypercholesterolemia     Past Surgical History  Procedure Laterality Date  . Total hip arthroplasty  2005    left sided    No family history on file.  History  Substance Use Topics  . Smoking status: Never Smoker   . Smokeless tobacco: Not on file  . Alcohol Use: Yes    OB History   Grav Para Term Preterm Abortions TAB SAB Ect Mult Living                  Review of Systems  Cardiovascular: Positive for chest pain.  All other systems reviewed and are negative.    Allergies  Penicillins  Home Medications   Current Outpatient Rx  Name  Route  Sig  Dispense  Refill  . amLODipine (NORVASC) 10 MG tablet   Oral   Take 10 mg by mouth daily.         . bimatoprost (LUMIGAN) 0.03 % ophthalmic  solution   Both Eyes   Place into both eyes at bedtime.         . brimonidine-timolol (COMBIGAN) 0.2-0.5 % ophthalmic solution   Both Eyes   Place 1 drop into both eyes every 12 (twelve) hours.         . Brinzolamide-Brimonidine (SIMBRINZA) 1-0.2 % SUSP   Ophthalmic   Apply 1 drop to eye at bedtime.         . saxagliptin HCl (ONGLYZA) 5 MG TABS tablet   Oral   Take 5 mg by mouth daily.         . simvastatin (ZOCOR) 20 MG tablet   Oral   Take 20 mg by mouth every morning.         . vitamin C (ASCORBIC ACID) 500 MG tablet   Oral   Take 500 mg by mouth daily.           BP 139/62  Pulse 50  Temp(Src) 97.8 F (36.6 C) (Oral)  Resp 16  Ht 5\' 1"  (1.549 m)  Wt 150 lb (68.04 kg)  BMI 28.36 kg/m2  SpO2 97%  Physical Exam  Nursing note and vitals reviewed. Constitutional: She is oriented to person, place, and time. She appears well-developed and well-nourished.  HENT:  Head: Normocephalic and atraumatic.  Eyes: Conjunctivae and EOM are normal. Pupils are equal, round, and reactive to light.  Neck: Normal range of motion and phonation normal. Neck supple.  Cardiovascular: Normal rate, regular rhythm and intact distal pulses.   Pulmonary/Chest: Effort normal and breath sounds normal. She exhibits no tenderness.  Abdominal: Soft. She exhibits no distension. There is no tenderness. There is no guarding.  Musculoskeletal: Normal range of motion.  Neurological: She is alert and oriented to person, place, and time. She has normal strength. She exhibits normal muscle tone.  Skin: Skin is warm and dry.  Psychiatric: She has a normal mood and affect. Her behavior is normal. Judgment and thought content normal.    ED Course  Procedures (including critical care time)     Date: 01/29/2012  Rate: 51  Rhythm: normal sinus rhythm  QRS Axis: normal  PR and QT Intervals: normal  ST/T Wave abnormalities: normal  PR and QRS Conduction Disutrbances:none  Narrative  Interpretation:   Old EKG Reviewed: unchanged   Labs Reviewed  CBC - Abnormal; Notable for the following:    Platelets 141 (*)    All other components within normal limits  BASIC METABOLIC PANEL - Abnormal; Notable for the following:    Glucose, Bld 230 (*)    GFR calc non Af Amer 78 (*)    All other components within normal limits  POCT I-STAT TROPONIN I   Dg Chest 2 View  06/24/2012  *RADIOLOGY REPORT*  Clinical Data: Chest pain and shortness of breath.  CHEST - 2 VIEW  Comparison: PA and lateral chest 05/30/2010.  Findings: Coarse bilateral breast calcifications are again seen. There is cardiomegaly without edema.  No pneumothorax or pleural effusion.  Calcified mitral annulus is noted.  IMPRESSION: No acute finding.  Stable compared to prior exam.   Original Report Authenticated By: Holley Dexter, M.D.    Nursing Notes Reviewed/ Care Coordinated Applicable Imaging Reviewed- chest x-ray, was reviewed, by me.  Interpretation of Laboratory Data incorporated into ED treatment  1. Vomiting       MDM  Nonspecific transient episode of dyspnea that improved with vomiting. No return of symptoms. Screening evaluation for cardiac and pulmonary problems, was negative.Doubt metabolic instability, serious bacterial infection or impending vascular collapse; the patient is stable for discharge.    Plan: Home Medications- usual; Home Treatments- gradually advance diet; Recommended follow up- PCP, when necessary    Flint Melter, MD 06/24/12 1208

## 2012-06-24 NOTE — ED Notes (Signed)
Pt tolerated po water with no choking.

## 2012-07-30 ENCOUNTER — Encounter (HOSPITAL_COMMUNITY): Payer: Self-pay | Admitting: *Deleted

## 2012-07-30 ENCOUNTER — Inpatient Hospital Stay (HOSPITAL_COMMUNITY)
Admission: EM | Admit: 2012-07-30 | Discharge: 2012-08-01 | DRG: 379 | Disposition: A | Discharge status: Home / Self Care | Payer: Medicare PPO | Attending: Internal Medicine | Admitting: Internal Medicine

## 2012-07-30 DIAGNOSIS — K5731 Diverticulosis of large intestine without perforation or abscess with bleeding: Principal | ICD-10-CM | POA: Diagnosis present

## 2012-07-30 DIAGNOSIS — Z79899 Other long term (current) drug therapy: Secondary | ICD-10-CM

## 2012-07-30 DIAGNOSIS — E119 Type 2 diabetes mellitus without complications: Secondary | ICD-10-CM | POA: Diagnosis present

## 2012-07-30 DIAGNOSIS — E7800 Pure hypercholesterolemia, unspecified: Secondary | ICD-10-CM | POA: Diagnosis present

## 2012-07-30 DIAGNOSIS — I10 Essential (primary) hypertension: Secondary | ICD-10-CM | POA: Diagnosis present

## 2012-07-30 DIAGNOSIS — Z96649 Presence of unspecified artificial hip joint: Secondary | ICD-10-CM

## 2012-07-30 DIAGNOSIS — K922 Gastrointestinal hemorrhage, unspecified: Secondary | ICD-10-CM

## 2012-07-30 HISTORY — DX: Diverticulitis of intestine, part unspecified, without perforation or abscess without bleeding: K57.92

## 2012-07-30 LAB — CBC
HCT: 33.5 % — ABNORMAL LOW (ref 36.0–46.0)
Hemoglobin: 12.2 g/dL (ref 12.0–15.0)
MCH: 32.8 pg (ref 26.0–34.0)
MCHC: 36.4 g/dL — ABNORMAL HIGH (ref 30.0–36.0)
MCV: 90.1 fL (ref 78.0–100.0)
Platelets: 163 10*3/uL (ref 150–400)
RBC: 3.72 MIL/uL — ABNORMAL LOW (ref 3.87–5.11)
RDW: 12.4 % (ref 11.5–15.5)
WBC: 7 10*3/uL (ref 4.0–10.5)

## 2012-07-30 LAB — POCT I-STAT, CHEM 8
BUN: 17 mg/dL (ref 6–23)
Calcium, Ion: 1.16 mmol/L (ref 1.13–1.30)
Chloride: 108 mEq/L (ref 96–112)
Creatinine, Ser: 0.9 mg/dL (ref 0.50–1.10)
Glucose, Bld: 223 mg/dL — ABNORMAL HIGH (ref 70–99)
HCT: 35 % — ABNORMAL LOW (ref 36.0–46.0)
Hemoglobin: 11.9 g/dL — ABNORMAL LOW (ref 12.0–15.0)
Potassium: 3.6 mEq/L (ref 3.5–5.1)
Sodium: 143 mEq/L (ref 135–145)
TCO2: 20 mmol/L (ref 0–100)

## 2012-07-30 LAB — PROTIME-INR
INR: 0.98 (ref 0.00–1.49)
Prothrombin Time: 12.9 seconds (ref 11.6–15.2)

## 2012-07-30 MED ORDER — SODIUM CHLORIDE 0.9 % IV BOLUS (SEPSIS)
2000.0000 mL | Freq: Once | INTRAVENOUS | Status: AC
  Administered 2012-07-30: 2000 mL via INTRAVENOUS

## 2012-07-30 NOTE — ED Notes (Addendum)
Per pt. Pt states about an hour and a half ago she started having rectal bleeding. Pt states that she lost "a lot of blood" pt denies abdominal cramping or pain. Pt alert, pt has bright red blood in underwear. Pt used bathroom in triage and unable to see how much blood was in the toilet.

## 2012-07-30 NOTE — ED Notes (Signed)
NT brought PT back to triage while family registered PT. PT stated that she felt weak and had been losing blood. PT told NT that she needed to use the bathroom and that she had been going often. NT took PT to bathroom and when PT came back out NT put PT in wheelchair and took her to triage room to get vitals. While trying to get a blood pressure on PT she started to have jerking motions. NT called Chrisandra Netters RN came in the room when nurse entered PT started snoring respirations and her eyes went to the right. RN and NT took PT back to Trauma B because she was unresponsive. Pod D staff and Dr.Manley came into room.

## 2012-07-30 NOTE — ED Notes (Signed)
Pt here with family. Pt was brought back from triage due to rectal bleeding. Pt was in room when she proceeded to shake in the bed with the NT. RN came back and pt was unresponsive to verbal stimuli, diaphoretic. Pt currently alert and able to answer questions.

## 2012-07-31 ENCOUNTER — Encounter (HOSPITAL_COMMUNITY): Payer: Self-pay | Admitting: *Deleted

## 2012-07-31 DIAGNOSIS — K922 Gastrointestinal hemorrhage, unspecified: Secondary | ICD-10-CM

## 2012-07-31 DIAGNOSIS — E119 Type 2 diabetes mellitus without complications: Secondary | ICD-10-CM | POA: Diagnosis present

## 2012-07-31 DIAGNOSIS — I10 Essential (primary) hypertension: Secondary | ICD-10-CM

## 2012-07-31 DIAGNOSIS — K5731 Diverticulosis of large intestine without perforation or abscess with bleeding: Secondary | ICD-10-CM | POA: Diagnosis present

## 2012-07-31 LAB — CBC
HCT: 29.2 % — ABNORMAL LOW (ref 36.0–46.0)
HCT: 26.7 % — ABNORMAL LOW (ref 36.0–46.0)
HCT: 25.6 % — ABNORMAL LOW (ref 36.0–46.0)
HCT: 23.3 % — ABNORMAL LOW (ref 36.0–46.0)
Hemoglobin: 10.5 g/dL — ABNORMAL LOW (ref 12.0–15.0)
Hemoglobin: 9.5 g/dL — ABNORMAL LOW (ref 12.0–15.0)
Hemoglobin: 9.3 g/dL — ABNORMAL LOW (ref 12.0–15.0)
Hemoglobin: 8.3 g/dL — ABNORMAL LOW (ref 12.0–15.0)
MCH: 32.5 pg (ref 26.0–34.0)
MCH: 32.3 pg (ref 26.0–34.0)
MCH: 33 pg (ref 26.0–34.0)
MCH: 32.7 pg (ref 26.0–34.0)
MCHC: 36 g/dL (ref 30.0–36.0)
MCHC: 35.6 g/dL (ref 30.0–36.0)
MCHC: 36.3 g/dL — ABNORMAL HIGH (ref 30.0–36.0)
MCHC: 35.6 g/dL (ref 30.0–36.0)
MCV: 90.4 fL (ref 78.0–100.0)
MCV: 90.8 fL (ref 78.0–100.0)
MCV: 90.8 fL (ref 78.0–100.0)
MCV: 91.7 fL (ref 78.0–100.0)
Platelets: 129 10*3/uL — ABNORMAL LOW (ref 150–400)
Platelets: 131 10*3/uL — ABNORMAL LOW (ref 150–400)
Platelets: 121 10*3/uL — ABNORMAL LOW (ref 150–400)
Platelets: 107 10*3/uL — ABNORMAL LOW (ref 150–400)
RBC: 3.23 MIL/uL — ABNORMAL LOW (ref 3.87–5.11)
RBC: 2.94 MIL/uL — ABNORMAL LOW (ref 3.87–5.11)
RBC: 2.82 MIL/uL — ABNORMAL LOW (ref 3.87–5.11)
RBC: 2.54 MIL/uL — ABNORMAL LOW (ref 3.87–5.11)
RDW: 12.4 % (ref 11.5–15.5)
RDW: 12.6 % (ref 11.5–15.5)
RDW: 12.6 % (ref 11.5–15.5)
RDW: 12.9 % (ref 11.5–15.5)
WBC: 9.6 10*3/uL (ref 4.0–10.5)
WBC: 7.9 10*3/uL (ref 4.0–10.5)
WBC: 8 10*3/uL (ref 4.0–10.5)
WBC: 6.7 10*3/uL (ref 4.0–10.5)

## 2012-07-31 LAB — URINALYSIS, ROUTINE W REFLEX MICROSCOPIC
Bilirubin Urine: NEGATIVE
Glucose, UA: 250 mg/dL — AB
Hgb urine dipstick: NEGATIVE
Ketones, ur: NEGATIVE mg/dL
Leukocytes, UA: NEGATIVE
Nitrite: NEGATIVE
Protein, ur: NEGATIVE mg/dL
Specific Gravity, Urine: 1.008 (ref 1.005–1.030)
Urobilinogen, UA: 0.2 mg/dL (ref 0.0–1.0)
pH: 6.5 (ref 5.0–8.0)

## 2012-07-31 LAB — COMPREHENSIVE METABOLIC PANEL
ALT: 23 U/L (ref 0–35)
AST: 24 U/L (ref 0–37)
Albumin: 3.8 g/dL (ref 3.5–5.2)
Alkaline Phosphatase: 70 U/L (ref 39–117)
BUN: 18 mg/dL (ref 6–23)
CO2: 21 mEq/L (ref 19–32)
Calcium: 8.9 mg/dL (ref 8.4–10.5)
Chloride: 103 mEq/L (ref 96–112)
Creatinine, Ser: 0.89 mg/dL (ref 0.50–1.10)
GFR calc Af Amer: 69 mL/min — ABNORMAL LOW (ref >90)
GFR calc non Af Amer: 59 mL/min — ABNORMAL LOW (ref >90)
Glucose, Bld: 222 mg/dL — ABNORMAL HIGH (ref 70–99)
Potassium: 3.6 mEq/L (ref 3.5–5.1)
Sodium: 138 mEq/L (ref 135–145)
Total Bilirubin: 0.7 mg/dL (ref 0.3–1.2)
Total Protein: 6.6 g/dL (ref 6.0–8.3)

## 2012-07-31 LAB — POCT I-STAT TROPONIN I: Troponin i, poc: 0.01 ng/mL (ref 0.00–0.08)

## 2012-07-31 LAB — BASIC METABOLIC PANEL
BUN: 15 mg/dL (ref 6–23)
CO2: 21 mEq/L (ref 19–32)
Calcium: 7.9 mg/dL — ABNORMAL LOW (ref 8.4–10.5)
Chloride: 110 mEq/L (ref 96–112)
Creatinine, Ser: 0.64 mg/dL (ref 0.50–1.10)
GFR calc Af Amer: >90 mL/min (ref >90)
GFR calc non Af Amer: 81 mL/min — ABNORMAL LOW (ref >90)
Glucose, Bld: 166 mg/dL — ABNORMAL HIGH (ref 70–99)
Potassium: 3.7 mEq/L (ref 3.5–5.1)
Sodium: 140 mEq/L (ref 135–145)

## 2012-07-31 LAB — APTT: aPTT: 28 seconds (ref 24–37)

## 2012-07-31 LAB — OCCULT BLOOD, POC DEVICE: Fecal Occult Bld: POSITIVE — AB

## 2012-07-31 LAB — TYPE AND SCREEN
ABO/RH(D): O NEG
Antibody Screen: NEGATIVE

## 2012-07-31 LAB — MRSA PCR SCREENING: MRSA by PCR: NEGATIVE

## 2012-07-31 LAB — GLUCOSE, CAPILLARY
Glucose-Capillary: 147 mg/dL — ABNORMAL HIGH (ref 70–99)
Glucose-Capillary: 136 mg/dL — ABNORMAL HIGH (ref 70–99)
Glucose-Capillary: 141 mg/dL — ABNORMAL HIGH (ref 70–99)

## 2012-07-31 LAB — ABO/RH: ABO/RH(D): O NEG

## 2012-07-31 MED ORDER — BRINZOLAMIDE 1 % OP SUSP
1.0000 [drp] | Freq: Every day | OPHTHALMIC | Status: DC
  Filled 2012-07-31: qty 10

## 2012-07-31 MED ORDER — ONDANSETRON HCL 4 MG/2ML IJ SOLN
4.0000 mg | Freq: Once | INTRAMUSCULAR | Status: AC
  Administered 2012-07-31: 4 mg via INTRAVENOUS
  Filled 2012-07-31: qty 2

## 2012-07-31 MED ORDER — BRIMONIDINE TARTRATE 0.2 % OP SOLN
1.0000 [drp] | Freq: Two times a day (BID) | OPHTHALMIC | Status: DC
  Administered 2012-07-31: 1 [drp] via OPHTHALMIC
  Filled 2012-07-31: qty 5

## 2012-07-31 MED ORDER — TIMOLOL MALEATE 0.5 % OP SOLN
1.0000 [drp] | Freq: Two times a day (BID) | OPHTHALMIC | Status: DC
  Administered 2012-07-31: 1 [drp] via OPHTHALMIC
  Filled 2012-07-31: qty 5

## 2012-07-31 MED ORDER — BRINZOLAMIDE-BRIMONIDINE 1-0.2 % OP SUSP: 1.0000 [drp] | Freq: Every day | OPHTHALMIC | Status: DC

## 2012-07-31 MED ORDER — SODIUM CHLORIDE 0.9 % IV SOLN
INTRAVENOUS | Status: DC
  Administered 2012-07-31 – 2012-08-01 (×2): via INTRAVENOUS

## 2012-07-31 MED ORDER — SODIUM CHLORIDE 0.9 % IJ SOLN
3.0000 mL | Freq: Two times a day (BID) | INTRAMUSCULAR | Status: DC
  Administered 2012-07-31 (×2): 3 mL via INTRAVENOUS

## 2012-07-31 MED ORDER — BRIMONIDINE TARTRATE-TIMOLOL 0.2-0.5 % OP SOLN: 1.0000 [drp] | Freq: Two times a day (BID) | OPHTHALMIC | Status: DC

## 2012-07-31 MED ORDER — BIMATOPROST 0.03 % OP SOLN
1.0000 [drp] | Freq: Every day | OPHTHALMIC | Status: DC
  Filled 2012-07-31: qty 2.5

## 2012-07-31 MED ORDER — SIMVASTATIN 20 MG PO TABS
20.0000 mg | ORAL_TABLET | Freq: Every day | ORAL | Status: DC
  Administered 2012-07-31: 20 mg via ORAL
  Filled 2012-07-31 (×2): qty 1

## 2012-07-31 MED ORDER — SODIUM CHLORIDE 0.9 % IV SOLN
Freq: Once | INTRAVENOUS | Status: AC
  Administered 2012-07-31: 01:00:00 via INTRAVENOUS

## 2012-07-31 NOTE — Progress Notes (Signed)
[facility] PROGRESS NOTE Interim History: 79 y.o. female who presents to the ED with c/o BRBPR onset at about 11pm last night, sudden onset, had blood with BM, patient used bathroom in triage and unable to see how much blood was in the toilet, bright red blood noted in her underwear on the way back to the ED pod. Patient has history of similar episode 5 years ago, admitted, scoped and found to have diverticular bleed at that time.    Assessment/Plan: Lower GI bleed: - drop in Hbg from 12.2-> 9.5. - Pateint asx. - One episode of bloody BM overnight. - Trend Hbg. - Avoid anticoagulants.    DM2 (diabetes mellitus, type 2) - start clear. - SSI.   HTN (hypertension) - cont to hold Bp medications.   Code Status: full Family Communication: none  Disposition Plan: inpatient   Consultants:  none  Procedures:  none  Antibiotics:  none   HPI/Subjective: No complains. One episode of bloody BM.  Objective: Filed Vitals:   07/31/12 0100 07/31/12 0130 07/31/12 0221 07/31/12 0430  BP: 107/61 139/58 132/63 126/55  Pulse: 73 77 79 88  Temp:   98 F (36.7 C) 98 F (36.7 C)  TempSrc:   Oral Oral  Resp:    13  Height:   5\' 1"  (1.549 m)   Weight:   67.8 kg (149 lb 7.6 oz)   SpO2: 99% 97% 99% 99%    Intake/Output Summary (Last 24 hours) at 07/31/12 0717 Last data filed at 07/31/12 0600  Gross per 24 hour  Intake    500 ml  Output    375 ml  Net    125 ml   Filed Weights   07/31/12 0221  Weight: 67.8 kg (149 lb 7.6 oz)    Exam:  General: Alert, awake, oriented x3, in no acute distress.  HEENT: No bruits, no goiter.  Heart: Regular rate and rhythm, without murmurs, rubs, gallops.  Lungs: Good air movement, clear to auscultations. Abdomen: Soft, nontender, nondistended, positive bowel sounds.  Neuro: Grossly intact, nonfocal.   Data Reviewed: Basic Metabolic Panel:  Recent Labs Lab 07/30/12 2342 07/30/12 2349 07/31/12 0555  NA 138 143 140  K 3.6  3.6 3.7  CL 103 108 110  CO2 21  --  21  GLUCOSE 222* 223* 166*  BUN 18 17 15   CREATININE 0.89 0.90 0.64  CALCIUM 8.9  --  7.9*   Liver Function Tests:  Recent Labs Lab 07/30/12 2342  AST 24  ALT 23  ALKPHOS 70  BILITOT 0.7  PROT 6.6  ALBUMIN 3.8   No results found for this basename: LIPASE, AMYLASE,  in the last 168 hours No results found for this basename: AMMONIA,  in the last 168 hours CBC:  Recent Labs Lab 07/30/12 2342 07/30/12 2349 07/31/12 0109 07/31/12 0555  WBC 7.0  --  9.6 7.9  HGB 12.2 11.9* 10.5* 9.5*  HCT 33.5* 35.0* 29.2* 26.7*  MCV 90.1  --  90.4 90.8  PLT 163  --  129* 131*   Cardiac Enzymes: No results found for this basename: CKTOTAL, CKMB, CKMBINDEX, TROPONINI,  in the last 168 hours BNP (last 3 results) No results found for this basename: PROBNP,  in the last 8760 hours CBG: No results found for this basename: GLUCAP,  in the last 168 hours  Recent Results (from the past 240 hour(s))  MRSA PCR SCREENING     Status: None   Collection Time    07/31/12  2:36 AM      Result Value Range Status   MRSA by PCR NEGATIVE  NEGATIVE Final   Comment:            The GeneXpert MRSA Assay (FDA     approved for NASAL specimens     only), is one component of a     comprehensive MRSA colonization     surveillance program. It is not     intended to diagnose MRSA     infection nor to guide or     monitor treatment for     MRSA infections.     Studies: No results found.  Scheduled Meds: . bimatoprost  1 drop Both Eyes QHS  . brimonidine  1 drop Both Eyes BID   And  . timolol  1 drop Both Eyes BID  . brinzolamide  1 drop Both Eyes QHS  . simvastatin  20 mg Oral q1800  . sodium chloride  3 mL Intravenous Q12H   Continuous Infusions: . sodium chloride 125 mL/hr at 07/31/12 0243     Marinda Elk  [facility] Pager [phone]. If 8PM-8AM, please contact night-coverage at [email or web address], password [email or web address] 07/31/2012, 7:17 AM  LOS: 1 day

## 2012-07-31 NOTE — H&P (Signed)
[facility] History and Physical  DENISSA COZART WGN:[id number] DOB: [birth year] DOA: 07/30/2012  Referring physician: ED PCP: Geraldo Pitter, MD    Chief Complaint: BRBPR  HPI: [name] is a 79 y.o. female who presents to the ED with c/o BRBPR onset at about 11pm last night, sudden onset, had blood with BM, patient used bathroom in triage and unable to see how much blood was in the toilet, bright red blood noted in her underwear on the way back to the ED pod.  Patient has history of similar episode 5 years ago, admitted, scoped and found to have diverticular bleed at that time.  In ED patient is hemodynamically stable though her BP is now somewhat soft (107/61), no tachycardia, no distress.  Typed and crossed but not yet transfused as HGB still 11.  Hospitalist asked to admit patient for LGI bleed.  Review of Systems: 12 systems reviewed and otherwise negative.  Past Medical History  Diagnosis Date  . Diabetes mellitus without complication   . Hypertension   . Hypercholesterolemia   . Diverticulitis    Past Surgical History  Procedure Laterality Date  . Total hip arthroplasty  2005    left sided  . Abdominal hysterectomy     Social History:  reports that she has never smoked. She does not have any smokeless tobacco history on file. She reports that  drinks alcohol. She reports that she does not use illicit drugs.   Allergies  Allergen Reactions  . Penicillins Anaphylaxis    History reviewed. No pertinent family history.  Prior to Admission medications   Medication Sig Start Date End Date Taking? Authorizing Provider  amLODipine (NORVASC) 10 MG tablet Take 10 mg by mouth daily.   Yes Historical Provider, MD  bimatoprost (LUMIGAN) 0.03 % ophthalmic solution Place into both eyes at bedtime.   Yes Historical Provider, MD  brimonidine-timolol (COMBIGAN) 0.2-0.5 % ophthalmic solution Place 1 drop into both eyes every 12 (twelve) hours.   Yes Historical Provider, MD   Brinzolamide-Brimonidine [facility]) 1-0.2 % SUSP Apply 1 drop to eye at bedtime.   Yes Historical Provider, MD  saxagliptin HCl (ONGLYZA) 5 MG TABS tablet Take 5 mg by mouth daily.   Yes Historical Provider, MD  simvastatin (ZOCOR) 20 MG tablet Take 20 mg by mouth every morning.   Yes Historical Provider, MD  vitamin C (ASCORBIC ACID) 500 MG tablet Take 500 mg by mouth daily.   Yes Historical Provider, MD   Physical Exam: Filed Vitals:   07/30/12 2328 07/30/12 2355 07/31/12 0030 07/31/12 0100  BP:  142/66 137/63 107/61  Pulse:  71 76 73  Temp: 98.6 F (37 C) 97.9 F (36.6 C)    TempSrc: Oral Oral    SpO2:  100% 99% 99%    General:  NAD, resting comfortably in bed Eyes: PEERLA EOMI ENT: mucous membranes moist Neck: supple w/o JVD Cardiovascular: RRR w/o MRG Respiratory: CTA B Abdomen: soft, nt, nd, bs+, bloody underwear noted, frank blood per rectum. Skin: no rash nor lesion Musculoskeletal: MAE, full ROM all 4 extremities Psychiatric: normal tone and affect Neurologic: AAOx3, grossly non-focal  Labs on Admission:  Basic Metabolic Panel:  Recent Labs Lab 07/30/12 2342 07/30/12 2349  NA 138 143  K 3.6 3.6  CL 103 108  CO2 21  --   GLUCOSE 222* 223*  BUN 18 17  CREATININE 0.89 0.90  CALCIUM 8.9  --    Liver Function Tests:  Recent Labs Lab 07/30/12 2342  AST 24  ALT 23  ALKPHOS 70  BILITOT 0.7  PROT 6.6  ALBUMIN 3.8   No results found for this basename: LIPASE, AMYLASE,  in the last 168 hours No results found for this basename: AMMONIA,  in the last 168 hours CBC:  Recent Labs Lab 07/30/12 2342 07/30/12 2349 07/31/12 0109  WBC 7.0  --  9.6  HGB 12.2 11.9* 10.5*  HCT 33.5* 35.0* 29.2*  MCV 90.1  --  90.4  PLT 163  --  129*   Cardiac Enzymes: No results found for this basename: CKTOTAL, CKMB, CKMBINDEX, TROPONINI,  in the last 168 hours  BNP (last 3 results) No results found for this basename: PROBNP,  in the last 8760 hours CBG: No results  found for this basename: GLUCAP,  in the last 168 hours  Radiological Exams on Admission: No results found.  EKG: Independently reviewed.  Assessment/Plan Principal Problem:   Lower GI bleed Active Problems:   DM2 (diabetes mellitus, type 2)   HTN (hypertension)   1. LGI bleed - putting patient on normal saline at 125 cc/hr, typed and crossed, being admitted to SDU due to initial briskness of the bleed though certainly she looks stable from a vital sign standpoint at this time.  Need GI consult in AM for scope, suspect that it will again be a diverticular bleed as it was last time she was admitted.  Keeping patient NPO. 2. DM2 - hold home metformin and use SSI 3. HTN - holding home meds since BP soft in ED    Code Status: Full Code (must indicate code status--if unknown or must be presumed, indicate so) Family Communication: Spoke with husband at bedside, all questions answered (indicate person spoken with, if applicable, with phone number if by telephone) Disposition Plan: Admit to inpatient (indicate anticipated LOS)  Time spent: 70 min  GARDNER, JARED M. [facility] Pager [phone]  If 7PM-7AM, please contact night-coverage [email or web address] Password [email or web address] 07/31/2012, 1:35 AM

## 2012-07-31 NOTE — ED Provider Notes (Signed)
History     CSN: [id number]  Arrival date & time 07/30/12  2324   First MD Initiated Contact with Patient 07/30/12 2352      Chief Complaint  Patient presents with  . Rectal Bleeding    (Consider location/radiation/quality/duration/timing/severity/associated sxs/prior treatment) HPI  He said that this is a late entry. The patient was seen and examined by me shortly after her arrival to the emergency department.  The patient is a very pleasant elderly woman who presents with hematochezia. The patient had a single but very large episode of hematochezia before calling 911. She says she passed clots. She says she filled the bathroom commode with blood. She feels generally weak and lightheaded. Nursing reports that the patient had a syncopal episode she was being placed into the gurney. However, this was very short-lived and the patient returned to normal level of consciousness immediately.  The patient denies experiencing abdominal pain. She denies any history of GI bleeding. She relates that she had a colonoscopy about 5 years ago. She is not anticoagulated. She does not take any antiplatelet medications. Denies experiencing chest pain and shortness of breath.  Past Medical History  Diagnosis Date  . Diabetes mellitus without complication   . Hypertension   . Hypercholesterolemia   . Diverticulitis     Past Surgical History  Procedure Laterality Date  . Total hip arthroplasty  2005    left sided  . Abdominal hysterectomy      History reviewed. No pertinent family history.  History  Substance Use Topics  . Smoking status: Never Smoker   . Smokeless tobacco: Not on file  . Alcohol Use: Yes    OB History   Grav Para Term Preterm Abortions TAB SAB Ect Mult Living                  Review of Systems Gen: no weight loss, fevers, chills, night sweats Eyes: no discharge or drainage, no occular pain or visual changes Nose: no epistaxis or rhinorrhea Mouth: no dental pain,  no sore throat Neck: no neck pain Lungs: no SOB, cough, wheezing CV: no chest pain, palpitations, dependent edema or orthopnea Heart: no chest pain.  Abd: no abdominal pain, nausea, vomiting  GU: no dysuria or gross hematuria MSK: no myalgias or arthralgias Neuro:  syncope as described above, no headache, no focal neurologic deficits Skin: no easy bruising Psyche: negative  Allergies  Penicillins  Home Medications   Current Outpatient Rx  Name  Route  Sig  Dispense  Refill  . amLODipine (NORVASC) 10 MG tablet   Oral   Take 10 mg by mouth daily.         . bimatoprost (LUMIGAN) 0.03 % ophthalmic solution   Both Eyes   Place into both eyes at bedtime.         . brimonidine-timolol (COMBIGAN) 0.2-0.5 % ophthalmic solution   Both Eyes   Place 1 drop into both eyes every 12 (twelve) hours.         . Brinzolamide-Brimonidine (SIMBRINZA) 1-0.2 % SUSP   Ophthalmic   Apply 1 drop to eye at bedtime.         . saxagliptin HCl (ONGLYZA) 5 MG TABS tablet   Oral   Take 5 mg by mouth daily.         . simvastatin (ZOCOR) 20 MG tablet   Oral   Take 20 mg by mouth every morning.         . vitamin C (ASCORBIC  ACID) 500 MG tablet   Oral   Take 500 mg by mouth daily.           BP 107/61  Pulse 73  Temp(Src) 97.9 F (36.6 C) (Oral)  SpO2 99%  Physical Exam Gen: well developed and well nourished appearing Head: NCAT Eyes: PERL, EOMI Nose: no epistaixis or rhinorrhea Mouth/throat: mucosa is moist and pink Neck: supple, no stridor Lungs: CTA B, no wheezing, rhonchi or rales Heart: RRR, no murmur, palp extremity pulses bilaterally Abd: soft, notender, nondistended RECTAL: dark red blood in rectum but no incontinence of bloody or stool.  Back: no ttp, no cva ttp Skin: no bruising or petechiae Neuro: CN ii-xii grossly intact, no focal deficits Psyche; anxious affect  ED Course  Procedures (including critical care time)  Labs Reviewed  CBC - Abnormal;  Notable for the following:    RBC 3.72 (*)    HCT 33.5 (*)    MCHC 36.4 (*)    All other components within normal limits  COMPREHENSIVE METABOLIC PANEL - Abnormal; Notable for the following:    Glucose, Bld 222 (*)    GFR calc non Af Amer 59 (*)    GFR calc Af Amer 69 (*)    All other components within normal limits  URINALYSIS, ROUTINE W REFLEX MICROSCOPIC - Abnormal; Notable for the following:    Color, Urine STRAW (*)    Glucose, UA 250 (*)    All other components within normal limits  CBC - Abnormal; Notable for the following:    RBC 3.23 (*)    Hemoglobin 10.5 (*)    HCT 29.2 (*)    Platelets 129 (*)    All other components within normal limits  POCT I-STAT, CHEM 8 - Abnormal; Notable for the following:    Glucose, Bld 223 (*)    Hemoglobin 11.9 (*)    HCT 35.0 (*)    All other components within normal limits  OCCULT BLOOD, POC DEVICE - Abnormal; Notable for the following:    Fecal Occult Bld POSITIVE (*)    All other components within normal limits  PROTIME-INR  APTT  CBC  BASIC METABOLIC PANEL  HEMOGLOBIN  HEMOGLOBIN  HEMOGLOBIN  TYPE AND SCREEN  ABO/RH   EKG: nsr, no acute ischemic changes, normal intervals, normal axis, normal qrs complex   MDM  Patient with lower gi bleeding and syncope in ED, normocytic anemia.  Resuscitation initiated with IVF and blood products ordered. Coags wnl. Case discussed with hospitalist who agreed to admit. Stepdown bed recommended.   CRITICAL CARE Performed by: Brandt Loosen   Total critical care time: 40  Critical care time was exclusive of separately billable procedures and treating other patients.  Critical care was necessary to treat or prevent imminent or life-threatening deterioration.  Critical care was time spent personally by me on the following activities: development of treatment plan with patient and/or surrogate as well as nursing, discussions with consultants, evaluation of patient's response to treatment,  examination of patient, obtaining history from patient or surrogate, ordering and performing treatments and interventions, ordering and review of laboratory studies, ordering and review of radiographic studies, pulse oximetry and re-evaluation of patient's condition.         Brandt Loosen, MD 08/02/12 [phone]

## 2012-08-01 LAB — CBC
HCT: 25.7 % — ABNORMAL LOW (ref 36.0–46.0)
Hemoglobin: 9.3 g/dL — ABNORMAL LOW (ref 12.0–15.0)
MCH: 32.6 pg (ref 26.0–34.0)
MCHC: 36.2 g/dL — ABNORMAL HIGH (ref 30.0–36.0)
MCV: 90.2 fL (ref 78.0–100.0)
Platelets: 116 10*3/uL — ABNORMAL LOW (ref 150–400)
RBC: 2.85 MIL/uL — ABNORMAL LOW (ref 3.87–5.11)
RDW: 12.8 % (ref 11.5–15.5)
WBC: 5.3 10*3/uL (ref 4.0–10.5)

## 2012-08-01 MED ORDER — FERROUS SULFATE 325 (65 FE) MG PO TABS: 325.0000 mg | ORAL_TABLET | Freq: Three times a day (TID) | ORAL | Status: DC

## 2012-08-01 NOTE — Progress Notes (Signed)
[facility] PROGRESS NOTE Interim History: 79 y.o. female who presents to the ED with c/o BRBPR onset at about 11pm last night, sudden onset, had blood with BM, patient used bathroom in triage and unable to see how much blood was in the toilet, bright red blood noted in her underwear on the way back to the ED pod. Patient has history of similar episode 5 years ago, admitted, scoped and found to have diverticular bleed at that time.    Assessment/Plan: Lower GI bleed: - drop in Hbg from 8.3->9.3 - Pateint asx. - Ine BM overnight on bloody. - Trend Hbg. - Avoid anticoagulants. - follow up with GI as an outpatient.    DM2 (diabetes mellitus, type 2) - start clear. - SSI.   HTN (hypertension) - cont to hold Bp medications.   Code Status: full Family Communication: none  Disposition Plan: inpatient   Consultants:  none  Procedures:  none  Antibiotics:  none   HPI/Subjective: No complains. One episode of bloody   Objective: Filed Vitals:   07/31/12 2012 08/01/12 0004 08/01/12 0008 08/01/12 0407  BP: 116/50 142/51  137/51  Pulse: 66   78  Temp: 98.1 F (36.7 C) 98.2 F (36.8 C)  98.6 F (37 C)  TempSrc: Oral Oral  Oral  Resp: 15 14  16   Height:      Weight:   68.2 kg (150 lb 5.7 oz)   SpO2: 98% 100%  98%    Intake/Output Summary (Last 24 hours) at 08/01/12 0723 Last data filed at 08/01/12 0600  Gross per 24 hour  Intake   3635 ml  Output   4725 ml  Net  -1090 ml   Filed Weights   07/31/12 0221 08/01/12 0008  Weight: 67.8 kg (149 lb 7.6 oz) 68.2 kg (150 lb 5.7 oz)    Exam:  General: Alert, awake, oriented x3, in no acute distress.  HEENT: No bruits, no goiter.  Heart: Regular rate and rhythm, without murmurs, rubs, gallops.  Lungs: Good air movement, clear to auscultations. Abdomen: Soft, nontender, nondistended, positive bowel sounds.  Neuro: Grossly intact, nonfocal.   Data Reviewed: Basic Metabolic Panel:  Recent Labs Lab  07/30/12 2342 07/30/12 2349 07/31/12 0555  NA 138 143 140  K 3.6 3.6 3.7  CL 103 108 110  CO2 21  --  21  GLUCOSE 222* 223* 166*  BUN 18 17 15   CREATININE 0.89 0.90 0.64  CALCIUM 8.9  --  7.9*   Liver Function Tests:  Recent Labs Lab 07/30/12 2342  AST 24  ALT 23  ALKPHOS 70  BILITOT 0.7  PROT 6.6  ALBUMIN 3.8   No results found for this basename: LIPASE, AMYLASE,  in the last 168 hours No results found for this basename: AMMONIA,  in the last 168 hours CBC:  Recent Labs Lab 07/31/12 0109 07/31/12 0555 07/31/12 0809 07/31/12 1851 08/01/12 0622  WBC 9.6 7.9 8.0 6.7 5.3  HGB 10.5* 9.5* 9.3* 8.3* 9.3*  HCT 29.2* 26.7* 25.6* 23.3* 25.7*  MCV 90.4 90.8 90.8 91.7 90.2  PLT 129* 131* 121* 107* 116*   Cardiac Enzymes: No results found for this basename: CKTOTAL, CKMB, CKMBINDEX, TROPONINI,  in the last 168 hours BNP (last 3 results) No results found for this basename: PROBNP,  in the last 8760 hours CBG:  Recent Labs Lab 07/31/12 0722 07/31/12 1724 07/31/12 2113  GLUCAP 147* 136* 141*    Recent Results (from the past 240 hour(s))  MRSA PCR SCREENING  Status: None   Collection Time    07/31/12  2:36 AM      Result Value Range Status   MRSA by PCR NEGATIVE  NEGATIVE Final   Comment:            The GeneXpert MRSA Assay (FDA     approved for NASAL specimens     only), is one component of a     comprehensive MRSA colonization     surveillance program. It is not     intended to diagnose MRSA     infection nor to guide or     monitor treatment for     MRSA infections.     Studies: No results found.  Scheduled Meds: . bimatoprost  1 drop Both Eyes QHS  . brimonidine  1 drop Both Eyes BID   And  . timolol  1 drop Both Eyes BID  . brinzolamide  1 drop Both Eyes QHS  . simvastatin  20 mg Oral q1800  . sodium chloride  3 mL Intravenous Q12H   Continuous Infusions: . sodium chloride 125 mL/hr at 08/01/12 0135     Marinda Elk  [facility] Pager [phone]. If 8PM-8AM, please contact night-coverage at [email or web address], password [facility] 08/01/2012, 7:23 AM  LOS: 2 days

## 2012-08-01 NOTE — Discharge Summary (Signed)
Physician Discharge Summary  [name]:[id number] DOB: [birth year] DOA: 07/30/2012  PCP: Geraldo Pitter, MD  Admit date: 07/30/2012 Discharge date: 08/01/2012  Time spent: 35 minutes  Recommendations for Outpatient Follow-up:  1. Follow up with Dr. Elnoria Howard as an outpatient.  Discharge Diagnoses:  Principal Problem:   Lower GI bleed Active Problems:   DM2 (diabetes mellitus, type 2)   HTN (hypertension)   Discharge Condition: stable  Diet recommendation: heart healthy  Filed Weights   07/31/12 0221 08/01/12 0008  Weight: 67.8 kg (149 lb 7.6 oz) 68.2 kg (150 lb 5.7 oz)    History of present illness:  79 y.o. female who presents to the ED with c/o BRBPR onset at about 11pm last night, sudden onset, had blood with BM, patient used bathroom in triage and unable to see how much blood was in the toilet, bright red blood noted in her underwear on the way back to the ED pod. Patient has history of similar episode 5 years ago, admitted, scoped and found to have diverticular bleed at that time.  In ED patient is hemodynamically stable though her BP is now somewhat soft (107/61), no tachycardia, no distress. Typed and crossed but not yet transfused as HGB still 11. Hospitalist asked to admit patient for LGI bleed.   Hospital Course:  Lower GI bleed:  - drop in Hbg from 11.5->8.3->9.3  - Pateint asx.  - Trended Hbg.  - Avoid anticoagulants.  - patient's clinical history is consistent with a diverticular bleed. She is stable. She is asx . She only had 2 bowel movements last one non-bloody. - follow up with GI as an outpatient.   DM2 (diabetes mellitus, type 2)  - start clear.  - SSI.   HTN (hypertension)  - cont to hold Bp medications.    Procedures:  none (i.e. Studies not automatically included, echos, thoracentesis, etc; not x-rays)  Consultations:  none  Discharge Exam: Filed Vitals:   07/31/12 2012 08/01/12 0004 08/01/12 0008 08/01/12 0407  BP: 116/50 142/51   137/51  Pulse: 66   78  Temp: 98.1 F (36.7 C) 98.2 F (36.8 C)  98.6 F (37 C)  TempSrc: Oral Oral  Oral  Resp: 15 14  16   Height:      Weight:   68.2 kg (150 lb 5.7 oz)   SpO2: 98% 100%  98%    General: A&O x3  Discharge Instructions  Discharge Orders   Future Orders Complete By Expires     Diet - low sodium heart healthy  As directed     Increase activity slowly  As directed         Medication List    TAKE these medications       amLODipine 10 MG tablet  Commonly known as:  NORVASC  Take 10 mg by mouth daily.     bimatoprost 0.03 % ophthalmic solution  Commonly known as:  LUMIGAN  Place into both eyes at bedtime.     COMBIGAN 0.2-0.5 % ophthalmic solution  Generic drug:  brimonidine-timolol  Place 1 drop into both eyes every 12 (twelve) hours.     ferrous sulfate 325 (65 FE) MG tablet  Take 1 tablet (325 mg total) by mouth 3 (three) times daily with meals.     ONGLYZA 5 MG Tabs tablet  Generic drug:  saxagliptin HCl  Take 5 mg by mouth daily.     SIMBRINZA 1-0.2 % Susp  Generic drug:  Brinzolamide-Brimonidine  Apply 1 drop to  eye at bedtime.     simvastatin 20 MG tablet  Commonly known as:  ZOCOR  Take 20 mg by mouth every morning.     vitamin C 500 MG tablet  Commonly known as:  ASCORBIC ACID  Take 500 mg by mouth daily.           Follow-up Information   Follow up with Geraldo Pitter, MD.   Contact information:   [address] [place] [address] [phone]       Follow up with HUNG,PATRICK D, MD In 2 weeks. (hospital follow up)    Contact information:   [address] [place] [address] [phone]        The results of significant diagnostics from this hospitalization (including imaging, microbiology, ancillary and laboratory) are listed below for reference.    Significant Diagnostic Studies: No results found.  Microbiology: Recent Results (from the past 240 hour(s))  MRSA PCR SCREENING      Status: None   Collection Time    07/31/12  2:36 AM      Result Value Range Status   MRSA by PCR NEGATIVE  NEGATIVE Final   Comment:            The GeneXpert MRSA Assay (FDA     approved for NASAL specimens     only), is one component of a     comprehensive MRSA colonization     surveillance program. It is not     intended to diagnose MRSA     infection nor to guide or     monitor treatment for     MRSA infections.     Labs: Basic Metabolic Panel:  Recent Labs Lab 07/30/12 2342 07/30/12 2349 07/31/12 0555  NA 138 143 140  K 3.6 3.6 3.7  CL 103 108 110  CO2 21  --  21  GLUCOSE 222* 223* 166*  BUN 18 17 15   CREATININE 0.89 0.90 0.64  CALCIUM 8.9  --  7.9*   Liver Function Tests:  Recent Labs Lab 07/30/12 2342  AST 24  ALT 23  ALKPHOS 70  BILITOT 0.7  PROT 6.6  ALBUMIN 3.8   No results found for this basename: LIPASE, AMYLASE,  in the last 168 hours No results found for this basename: AMMONIA,  in the last 168 hours CBC:  Recent Labs Lab 07/31/12 0109 07/31/12 0555 07/31/12 0809 07/31/12 1851 08/01/12 0622  WBC 9.6 7.9 8.0 6.7 5.3  HGB 10.5* 9.5* 9.3* 8.3* 9.3*  HCT 29.2* 26.7* 25.6* 23.3* 25.7*  MCV 90.4 90.8 90.8 91.7 90.2  PLT 129* 131* 121* 107* 116*   Cardiac Enzymes: No results found for this basename: CKTOTAL, CKMB, CKMBINDEX, TROPONINI,  in the last 168 hours BNP: BNP (last 3 results) No results found for this basename: PROBNP,  in the last 8760 hours CBG:  Recent Labs Lab 07/31/12 0722 07/31/12 1724 07/31/12 2113  GLUCAP 147* 136* 141*       Signed:  Marinda Elk  [facility] 08/01/2012, 7:32 AM

## 2012-08-10 LAB — GLUCOSE, CAPILLARY
Comment 1: 34166
Glucose-Capillary: 206 mg/dL — ABNORMAL HIGH (ref 70–99)

## 2013-07-14 ENCOUNTER — Encounter (HOSPITAL_COMMUNITY): Payer: Self-pay | Admitting: Emergency Medicine

## 2013-07-14 ENCOUNTER — Emergency Department (HOSPITAL_COMMUNITY)
Admission: EM | Admit: 2013-07-14 | Discharge: 2013-07-14 | Disposition: A | Discharge status: Home / Self Care | Payer: Medicare PPO | Source: Home / Self Care | Attending: Family Medicine | Admitting: Family Medicine

## 2013-07-14 DIAGNOSIS — L250 Unspecified contact dermatitis due to cosmetics: Secondary | ICD-10-CM

## 2013-07-14 HISTORY — DX: Zoster without complications: B02.9

## 2013-07-14 MED ORDER — TRIAMCINOLONE ACETONIDE 40 MG/ML IJ SUSP
40.0000 mg | Freq: Once | INTRAMUSCULAR | Status: AC
  Administered 2013-07-14: 40 mg via INTRAMUSCULAR

## 2013-07-14 MED ORDER — TRIAMCINOLONE ACETONIDE 40 MG/ML IJ SUSP
INTRAMUSCULAR | Status: AC
  Filled 2013-07-14: qty 1

## 2013-07-14 MED ORDER — MOMETASONE FUROATE 0.1 % EX CREA: 1.0000 "application " | TOPICAL_CREAM | Freq: Every day | CUTANEOUS | Status: DC

## 2013-07-14 NOTE — ED Provider Notes (Addendum)
CSN: [id number]     Arrival date & time 07/14/13  7829 History   None    Chief Complaint  Patient presents with  . Rash   (Consider location/radiation/quality/duration/timing/severity/associated sxs/prior Treatment) Patient is a 80 y.o. female presenting with rash. The history is provided by the patient.  Rash Location:  Face Facial rash location:  Forehead, L cheek, R cheek, chin, lip and nose Quality: dryness, itchiness and redness   Severity:  Mild Chronicity:  New Context comment:  Present for 2 weeks , getting worse for past 2 d. no new contacts. Associated symptoms: no fever, no periorbital edema, no shortness of breath, no sore throat, no throat swelling, no tongue swelling, not vomiting and not wheezing     Past Medical History  Diagnosis Date  . Diabetes mellitus without complication   . Hypertension   . Hypercholesterolemia   . Diverticulitis   . Shingles    Past Surgical History  Procedure Laterality Date  . Total hip arthroplasty  2005    left sided  . Abdominal hysterectomy     No family history on file. History  Substance Use Topics  . Smoking status: Never Smoker   . Smokeless tobacco: Not on file  . Alcohol Use: Yes   OB History   Grav Para Term Preterm Abortions TAB SAB Ect Mult Living                 Review of Systems  Constitutional: Negative.  Negative for fever.  HENT: Positive for facial swelling. Negative for sore throat.   Respiratory: Negative for shortness of breath and wheezing.   Gastrointestinal: Negative for vomiting.  Skin: Positive for rash.    Allergies  Penicillins  Home Medications   Current Outpatient Rx  Name  Route  Sig  Dispense  Refill  . amLODipine (NORVASC) 10 MG tablet   Oral   Take 10 mg by mouth daily.         . bimatoprost (LUMIGAN) 0.03 % ophthalmic solution   Both Eyes   Place into both eyes at bedtime.         . brimonidine-timolol (COMBIGAN) 0.2-0.5 % ophthalmic solution   Both Eyes   Place 1  drop into both eyes every 12 (twelve) hours.         . Brinzolamide-Brimonidine (SIMBRINZA) 1-0.2 % SUSP   Ophthalmic   Apply 1 drop to eye at bedtime.         . ferrous sulfate 325 (65 FE) MG tablet   Oral   Take 1 tablet (325 mg total) by mouth 3 (three) times daily with meals.   90 tablet   0   . mometasone (ELOCON) 0.1 % cream   Topical   Apply 1 application topically daily.   50 g   0   . saxagliptin HCl (ONGLYZA) 5 MG TABS tablet   Oral   Take 5 mg by mouth daily.         . simvastatin (ZOCOR) 20 MG tablet   Oral   Take 20 mg by mouth every morning.         . vitamin C (ASCORBIC ACID) 500 MG tablet   Oral   Take 500 mg by mouth daily.          BP 116/49  Pulse 54  Temp(Src) 98.3 F (36.8 C) (Oral)  Resp 20  SpO2 99% Physical Exam  Nursing note and vitals reviewed. Constitutional: She is oriented to person, place, and time.  She appears well-developed and well-nourished.  HENT:  Right Ear: External ear normal.  Left Ear: External ear normal.  Mouth/Throat: Oropharynx is clear and moist.  Eyes: Conjunctivae and EOM are normal. Pupils are equal, round, and reactive to light.  Neck: Normal range of motion. Neck supple.  Pulmonary/Chest: Breath sounds normal.  Lymphadenopathy:    She has no cervical adenopathy.  Neurological: She is alert and oriented to person, place, and time.  Skin: Skin is warm and dry. Rash noted.  Patchy erythema to face, sparing eyes bilat and bridge of nose, also on ant neck , not post neck or chest, no other body rash.    ED Course  Procedures (including critical care time) Labs Review Labs Reviewed - No data to display Imaging Review No results found.   MDM   1. Contact dermatitis due to cosmetics        Linna HoffJames D Azari Hasler, MD 07/14/13 1138  Linna HoffJames D Shericka Johnstone, MD 07/14/13 [phone]

## 2013-07-14 NOTE — ED Notes (Signed)
Skin irritation to face for 2 weeks and getting worse.  No change in make up, lotions, or soaps.

## 2014-01-27 ENCOUNTER — Encounter (HOSPITAL_COMMUNITY): Payer: Self-pay | Admitting: Emergency Medicine

## 2014-01-27 ENCOUNTER — Emergency Department (INDEPENDENT_AMBULATORY_CARE_PROVIDER_SITE_OTHER)
Admission: EM | Admit: 2014-01-27 | Discharge: 2014-01-27 | Disposition: A | Discharge status: Home / Self Care | Payer: Medicare PPO | Source: Home / Self Care | Attending: Family Medicine | Admitting: Family Medicine

## 2014-01-27 DIAGNOSIS — T383X5A Adverse effect of insulin and oral hypoglycemic [antidiabetic] drugs, initial encounter: Secondary | ICD-10-CM

## 2014-01-27 DIAGNOSIS — R531 Weakness: Secondary | ICD-10-CM

## 2014-01-27 LAB — POCT I-STAT, CHEM 8
BUN: 16 mg/dL (ref 6–23)
Calcium, Ion: 1.2 mmol/L (ref 1.13–1.30)
Chloride: 104 mEq/L (ref 96–112)
Creatinine, Ser: 0.9 mg/dL (ref 0.50–1.10)
Glucose, Bld: 151 mg/dL — ABNORMAL HIGH (ref 70–99)
HCT: 42 % (ref 36.0–46.0)
Hemoglobin: 14.3 g/dL (ref 12.0–15.0)
Potassium: 3.9 mEq/L (ref 3.7–5.3)
Sodium: 137 mEq/L (ref 137–147)
TCO2: 24 mmol/L (ref 0–100)

## 2014-01-27 NOTE — ED Notes (Addendum)
Pt  Is  A  Diabetic   Who     Was at  A  Sports  Event       And  Became   Weak         And        Dizzy     denys  Any  Chest    Pain   Or  Any   Shortness  Of  Breath        At  This time     The  Pt     Is  Awake   And   Alert         At  The  Scent  Pt  Glucose   Was  167             Her  Skin  Was  Warm  And  Dry

## 2014-01-27 NOTE — Discharge Instructions (Signed)
Home to rest, eat well, take your medicine, return to ER if further problems or call your doctor to discuss.

## 2014-01-27 NOTE — ED Provider Notes (Signed)
CSN: [id number]     Arrival date & time 01/27/14  1433 History   First MD Initiated Contact with Patient 01/27/14 1439     Chief Complaint  Patient presents with  . Weakness   (Consider location/radiation/quality/duration/timing/severity/associated sxs/prior Treatment) Patient is a 80 y.o. female presenting with weakness. The history is provided by the patient and the spouse.  Weakness This is a new problem. The current episode started 1 to 2 hours ago (at football game and had spell that has resolved, ems found bs 167.). The problem has been rapidly improving. Pertinent negatives include no chest pain, no abdominal pain, no headaches and no shortness of breath.    Past Medical History  Diagnosis Date  . Diabetes mellitus without complication   . Hypertension   . Hypercholesterolemia   . Diverticulitis   . Shingles    Past Surgical History  Procedure Laterality Date  . Total hip arthroplasty  2005    left sided  . Abdominal hysterectomy     History reviewed. No pertinent family history. History  Substance Use Topics  . Smoking status: Never Smoker   . Smokeless tobacco: Not on file  . Alcohol Use: Yes   OB History   Grav Para Term Preterm Abortions TAB SAB Ect Mult Living                 Review of Systems  Respiratory: Negative.  Negative for shortness of breath.   Cardiovascular: Negative.  Negative for chest pain.  Gastrointestinal: Negative.  Negative for abdominal pain.  Neurological: Positive for weakness. Negative for dizziness, seizures, syncope, speech difficulty, light-headedness and headaches.    Allergies  Penicillins  Home Medications   Prior to Admission medications   Medication Sig Start Date End Date Taking? Authorizing Provider  amLODipine (NORVASC) 10 MG tablet Take 10 mg by mouth daily.    Historical Provider, MD  bimatoprost (LUMIGAN) 0.03 % ophthalmic solution Place into both eyes at bedtime.    Historical Provider, MD  brimonidine-timolol  (COMBIGAN) 0.2-0.5 % ophthalmic solution Place 1 drop into both eyes every 12 (twelve) hours.    Historical Provider, MD  Brinzolamide-Brimonidine [facility](SIMBRINZA) 1-0.2 % SUSP Apply 1 drop to eye at bedtime.    Historical Provider, MD  ferrous sulfate 325 (65 FE) MG tablet Take 1 tablet (325 mg total) by mouth 3 (three) times daily with meals. 08/01/12   Marinda ElkAbraham Feliz Ortiz, MD  mometasone (ELOCON) 0.1 % cream Apply 1 application topically daily. 07/14/13   Linna HoffJames D Linzi Ohlinger, MD  saxagliptin HCl (ONGLYZA) 5 MG TABS tablet Take 5 mg by mouth daily.    Historical Provider, MD  simvastatin (ZOCOR) 20 MG tablet Take 20 mg by mouth every morning.    Historical Provider, MD  vitamin C (ASCORBIC ACID) 500 MG tablet Take 500 mg by mouth daily.    Historical Provider, MD   BP 145/90  Pulse 74  Temp(Src) 97.8 F (36.6 C) (Oral)  Resp 16  SpO2 100% Physical Exam  Nursing note and vitals reviewed. Constitutional: She is oriented to person, place, and time. She appears well-developed and well-nourished. No distress.  HENT:  Mouth/Throat: Oropharynx is clear and moist.  Eyes: Conjunctivae and EOM are normal. Pupils are equal, round, and reactive to light.  Neck: Normal range of motion. Neck supple.  Cardiovascular: Regular rhythm, normal heart sounds and intact distal pulses.   Pulmonary/Chest: Effort normal and breath sounds normal.  Abdominal: Soft. Bowel sounds are normal. There is no tenderness.  Lymphadenopathy:    She has no cervical adenopathy.  Neurological: She is alert and oriented to person, place, and time. No cranial nerve deficit. Coordination normal.  Skin: Skin is warm and dry.  Psychiatric: She has a normal mood and affect. Her behavior is normal. Judgment and thought content normal.    ED Course  Procedures (including critical care time) Labs Review Labs Reviewed  POCT I-STAT, CHEM 8 - Abnormal; Notable for the following:    Glucose, Bld 151 (*)    All other components within normal  limits   ecg- wnl, nsr. Imaging Review No results found.   MDM   1. Adverse reaction to oral hypoglycemic drug, initial encounter    Alert, ambulatory ,feels fine at d/c.    Linna HoffJames D Dorothee Napierkowski, MD 01/31/14 2027

## 2014-06-22 DIAGNOSIS — H4011X2 Primary open-angle glaucoma, moderate stage: Secondary | ICD-10-CM | POA: Diagnosis not present

## 2014-07-20 DIAGNOSIS — H4011X3 Primary open-angle glaucoma, severe stage: Secondary | ICD-10-CM | POA: Diagnosis not present

## 2014-08-03 DIAGNOSIS — H4011X3 Primary open-angle glaucoma, severe stage: Secondary | ICD-10-CM | POA: Diagnosis not present

## 2014-10-24 DIAGNOSIS — R7309 Other abnormal glucose: Secondary | ICD-10-CM | POA: Diagnosis not present

## 2014-10-24 DIAGNOSIS — I10 Essential (primary) hypertension: Secondary | ICD-10-CM | POA: Diagnosis not present

## 2014-10-26 DIAGNOSIS — E782 Mixed hyperlipidemia: Secondary | ICD-10-CM | POA: Diagnosis not present

## 2014-10-26 DIAGNOSIS — R7302 Impaired glucose tolerance (oral): Secondary | ICD-10-CM | POA: Diagnosis not present

## 2014-10-26 DIAGNOSIS — I10 Essential (primary) hypertension: Secondary | ICD-10-CM | POA: Diagnosis not present

## 2014-12-04 DIAGNOSIS — H4011X3 Primary open-angle glaucoma, severe stage: Secondary | ICD-10-CM | POA: Diagnosis not present

## 2015-02-19 DIAGNOSIS — E119 Type 2 diabetes mellitus without complications: Secondary | ICD-10-CM | POA: Diagnosis not present

## 2015-02-19 DIAGNOSIS — H35039 Hypertensive retinopathy, unspecified eye: Secondary | ICD-10-CM | POA: Diagnosis not present

## 2015-02-19 DIAGNOSIS — H43813 Vitreous degeneration, bilateral: Secondary | ICD-10-CM | POA: Diagnosis not present

## 2015-02-19 DIAGNOSIS — H401133 Primary open-angle glaucoma, bilateral, severe stage: Secondary | ICD-10-CM | POA: Diagnosis not present

## 2015-02-21 DIAGNOSIS — E782 Mixed hyperlipidemia: Secondary | ICD-10-CM | POA: Diagnosis not present

## 2015-02-21 DIAGNOSIS — I10 Essential (primary) hypertension: Secondary | ICD-10-CM | POA: Diagnosis not present

## 2015-02-21 DIAGNOSIS — R7309 Other abnormal glucose: Secondary | ICD-10-CM | POA: Diagnosis not present

## 2015-02-26 DIAGNOSIS — E782 Mixed hyperlipidemia: Secondary | ICD-10-CM | POA: Diagnosis not present

## 2015-02-26 DIAGNOSIS — Z6829 Body mass index (BMI) 29.0-29.9, adult: Secondary | ICD-10-CM | POA: Diagnosis not present

## 2015-02-26 DIAGNOSIS — R7302 Impaired glucose tolerance (oral): Secondary | ICD-10-CM | POA: Diagnosis not present

## 2015-02-26 DIAGNOSIS — I10 Essential (primary) hypertension: Secondary | ICD-10-CM | POA: Diagnosis not present

## 2015-03-22 DIAGNOSIS — Z1231 Encounter for screening mammogram for malignant neoplasm of breast: Secondary | ICD-10-CM | POA: Diagnosis not present

## 2017-05-26 ENCOUNTER — Other Ambulatory Visit (HOSPITAL_COMMUNITY): Payer: Self-pay | Admitting: Family Medicine

## 2017-05-26 ENCOUNTER — Other Ambulatory Visit: Payer: Self-pay | Admitting: Family Medicine

## 2017-05-26 DIAGNOSIS — M7989 Other specified soft tissue disorders: Secondary | ICD-10-CM

## 2017-05-26 DIAGNOSIS — M79605 Pain in left leg: Secondary | ICD-10-CM

## 2017-05-26 DIAGNOSIS — R609 Edema, unspecified: Secondary | ICD-10-CM

## 2017-05-27 ENCOUNTER — Ambulatory Visit (HOSPITAL_COMMUNITY)
Admission: RE | Admit: 2017-05-27 | Discharge: 2017-05-27 | Disposition: A | Discharge status: Home / Self Care | Payer: Medicare Other | Source: Ambulatory Visit | Attending: Family Medicine | Admitting: Family Medicine

## 2017-05-27 DIAGNOSIS — M79606 Pain in leg, unspecified: Secondary | ICD-10-CM | POA: Insufficient documentation

## 2017-05-27 DIAGNOSIS — M7989 Other specified soft tissue disorders: Secondary | ICD-10-CM

## 2017-05-27 DIAGNOSIS — M79605 Pain in left leg: Secondary | ICD-10-CM

## 2017-05-27 DIAGNOSIS — R2242 Localized swelling, mass and lump, left lower limb: Secondary | ICD-10-CM | POA: Insufficient documentation

## 2017-05-27 NOTE — Progress Notes (Signed)
Left lower extremity enous duplex completed. No evidence of DVT, superficial thrombosis, or Baker's cyst. Toma DeitersVirginia Kylian Loh, RVS 05/27/2017, 10:08 AM

## 2017-06-21 ENCOUNTER — Encounter: Payer: Self-pay | Admitting: Physical Therapy

## 2017-06-21 ENCOUNTER — Ambulatory Visit: Payer: Medicare Other | Attending: Family Medicine | Admitting: Physical Therapy

## 2017-06-21 DIAGNOSIS — M5442 Lumbago with sciatica, left side: Secondary | ICD-10-CM

## 2017-06-21 DIAGNOSIS — R296 Repeated falls: Secondary | ICD-10-CM

## 2017-06-21 DIAGNOSIS — R262 Difficulty in walking, not elsewhere classified: Secondary | ICD-10-CM | POA: Diagnosis present

## 2017-06-21 DIAGNOSIS — M25562 Pain in left knee: Secondary | ICD-10-CM | POA: Insufficient documentation

## 2017-06-21 DIAGNOSIS — M6281 Muscle weakness (generalized): Secondary | ICD-10-CM | POA: Insufficient documentation

## 2017-06-21 NOTE — Therapy (Signed)
[facility] [address] [place], [place], [address] Phone: [phone]   Fax:  [phone]  Physical Therapy Evaluation  Patient Details  Name: [name] MRN: [id number] Date of Birth: 06/28/1928 Referring Provider: Renaye Rakers, MD   Encounter Date: 06/21/2017  PT End of Session - 06/21/17 1148    Visit Number  1    Number of Visits  12    Date for PT Re-Evaluation  08/21/17    PT Start Time  1030    PT Stop Time  1120    PT Time Calculation (min)  50 min    Activity Tolerance  Patient limited by pain    Behavior During Therapy  [facility] for tasks assessed/performed       Past Medical History:  Diagnosis Date  . Diabetes mellitus without complication (HCC)   . Diverticulitis   . Hypercholesterolemia   . Hypertension   . Shingles     Past Surgical History:  Procedure Laterality Date  . ABDOMINAL HYSTERECTOMY    . TOTAL HIP ARTHROPLASTY  2005   left sided    There were no vitals filed for this visit.   Subjective Assessment - 06/21/17 1044    Subjective  Pt arriving to therapy reporting a fall on 05/03/17. Pt has been complaining of LBP since. Pt and spouse reporting compression fx of lumbar spine. Pt currently in 7/-8/10 pain. Pt also reporting left knee pain with bending.  Pt reporting being limited with ADL's and having difficulty with getting up out of bed and transitions from sit to stand. Pt not currently using any assistivve device.     Patient is accompained by:  Family member    Pertinent History  h/o right hip replacement years ago, h/o falls    Limitations  House hold activities;Walking    How long can you sit comfortably?  60 minutes    How long can you stand comfortably?  20-30 minutes with UE support    How long can you walk comfortably?  10 minutes    Diagnostic tests  05/03/17    Patient Stated Goals  Stop hurting    Currently in Pain?  Yes    Pain Score  7     Pain Location  Back    Pain  Orientation  Lower;Mid    Pain Descriptors / Indicators  Aching    Pain Type  Acute pain    Pain Radiating Towards  left leg    Pain Onset  More than a month ago    Pain Frequency  Constant    Aggravating Factors   getting out of bed, standing from sitting position    Pain Relieving Factors  intermittent wearing a back support    Multiple Pain Sites  Yes    Pain Score  4    Pain Location  Knee    Pain Orientation  Left    Pain Descriptors / Indicators  Aching    Pain Type  Acute pain    Pain Onset  More than a month ago    Aggravating Factors   bending, sit to stand    Pain Relieving Factors  resting         [facility] PT Assessment - 06/21/17 0001      Assessment   Medical Diagnosis  low back pain s/p fall with compression fx of lumbar spine    Referring Provider  Renaye Rakers, MD    Hand Dominance  Right  Prior Therapy  years ago for hip       Balance Screen   Has the patient fallen in the past 6 months  Yes    How many times?  2    Has the patient had a decrease in activity level because of a fear of falling?   Yes    Is the patient reluctant to leave their home because of a fear of falling?   No      Home Public house manager residence    Living Arrangements  Spouse/significant other    Available Help at Discharge  Family    Type of Home  House    Home Access  Stairs to enter    Entrance Stairs-Number of Steps  3    Entrance Stairs-Rails  Right      Prior Function   Level of Independence  Independent    Vocation  Retired      IT consultant   Overall Cognitive Status  Within Functional Limits for tasks assessed      ROM / Strength   AROM / PROM / Strength  AROM;Strength      AROM   AROM Assessment Site  Knee;Lumbar    Right/Left Knee  Left    Left Knee Extension  5    Left Knee Flexion  95 limited by pain    Lumbar Flexion  80    Lumbar Extension  10    Lumbar - Right Side Bend  10    Lumbar - Left Side Bend  20    Lumbar - Right Rotation   limited    Lumbar - Left Rotation  WFL      Strength   Overall Strength Comments  --    Strength Assessment Site  Knee    Right/Left Knee  Right;Left    Right Knee Flexion  4/5    Right Knee Extension  4/5    Left Knee Flexion  3/5 limited by pain    Left Knee Extension  3/5 limited by pain      Palpation   Palpation comment  Pt with tenderness noted in left QL and piriformis      Transfers   Five time sit to stand comments   20 seconds with no UE support      Ambulation/Gait   Ambulation/Gait  Yes    Ambulation/Gait Assistance  7: Independent    Ambulation Distance (Feet)  50 Feet    Assistive device  None    Gait Pattern  Step-through pattern;Decreased step length - right;Decreased step length - left;Poor foot clearance - left;Poor foot clearance - right    Ambulation Surface  Level;Indoor      Standardized Balance Assessment   Standardized Balance Assessment  Berg Balance Test      Berg Balance Test   Sit to Stand  Able to stand without using hands and stabilize independently    Standing Unsupported  Able to stand safely 2 minutes    Sitting with Back Unsupported but Feet Supported on Floor or Stool  Able to sit safely and securely 2 minutes    Stand to Sit  Sits safely with minimal use of hands    Transfers  Able to transfer safely, minor use of hands    Standing Unsupported with Eyes Closed  Able to stand 10 seconds with supervision    Standing Ubsupported with Feet Together  Able to place feet together independently and stand for 1 minute  with supervision    From Standing, Reach Forward with Outstretched Arm  Can reach forward >12 cm safely (5")    From Standing Position, Pick up Object from Floor  Able to pick up shoe safely and easily    From Standing Position, Turn to Look Behind Over each Shoulder  Turn sideways only but maintains balance    Turn 360 Degrees  Able to turn 360 degrees safely but slowly    Standing Unsupported, Alternately Place Feet on Step/Stool   Able to stand independently and complete 8 steps >20 seconds    Standing Unsupported, One Foot in Front  Able to take small step independently and hold 30 seconds    Standing on One Leg  Tries to lift leg/unable to hold 3 seconds but remains standing independently    Total Score  43             Objective measurements completed on examination: See above findings.              PT Education - 06/21/17 1147    Education provided  Yes    Education Details  HEP    Person(s) Educated  Patient;Spouse    Methods  Explanation;Demonstration;Handout;Verbal cues;Tactile cues    Comprehension  Verbalized understanding;Returned demonstration       PT Short Term Goals - 06/21/17 1158      PT SHORT TERM GOAL #1   Title  Pt will be independent in her HEP    Time  3    Period  Weeks    Status  New    Target Date  07/15/17      PT SHORT TERM GOAL #2   Title  Pt will be able to amb on community surfaces with no LOB with no device for >/1000 feet.     Time  3    Period  Weeks    Status  New    Target Date  07/15/17      PT SHORT TERM GOAL #3   Title  Pt will report less pain with sit to stand of </= 3/10.     Time  3    Period  Weeks    Status  New    Target Date  07/15/17        PT Long Term Goals - 06/21/17 1200      PT LONG TERM GOAL #1   Title  Pt will improve her FOTO from limitation to </=    limitation.     Time  6    Period  Weeks    Status  New    Target Date  08/05/17      PT LONG TERM GOAL #2   Title  Pt will improve her BERG balance score from 43/56 to >/= 50/56 to improve balance and decrease fall risk.     Time  6    Period  Weeks    Status  New    Target Date  08/05/17      PT LONG TERM GOAL #3   Title  Pt will be able to report pain </= 2/10 when getting out of bed each morning.     Time  6    Period  Weeks    Status  New    Target Date  08/05/17      PT LONG TERM GOAL #4   Title  Pt will improve left knee AROM flexion to >/= 115 degrees.  Time  6    Period  Weeks    Status  New    Target Date  08/05/17             Plan - 06/21/17 1153    Clinical Impression Statement  Pt arriving to therapy after reporting 2 falls in the last 6 months with last one being on 05/03/17 s/p lumbar compression fx. Pt reporting low back pain and left knee pain. Pt with weakness noted in bilateral LE's left >/right and limitation with lumbar ROM and knee ROM on the left. Pt also with decreased in dynamic balance with BERG score of 43/56. Pt currently using no assisitve device for amb. Pt was issued a HEP and pt and spouse were edu on POC and core strengthening.     Clinical Presentation  Evolving    Clinical Presentation due to:  radiation down left leg    Clinical Decision Making  Low    Rehab Potential  Good    PT Frequency  2x / week    PT Treatment/Interventions  ADLs/Self Care Home Management;Cryotherapy;Electrical Stimulation;Iontophoresis 4mg /ml Dexamethasone;Moist Heat;Therapeutic exercise;Therapeutic activities;Functional mobility training;Stair training;Gait training;Ultrasound;Balance training;Neuromuscular re-education;Patient/family education;Manual techniques;Passive range of motion    PT Next Visit Plan  Core strengthening/stretching, balance, left knee exercises/stretching for ROM and strength.     PT Home Exercise Plan  QS, PPT, SKTC, supine marching, hamstring stretch seated    Consulted and Agree with Plan of Care  Patient;Family member/caregiver    Family Member Consulted  spouse       Patient will benefit from skilled therapeutic intervention in order to improve the following deficits and impairments:  Pain, Postural dysfunction, Decreased balance, Decreased mobility, Difficulty walking, Decreased range of motion, Decreased activity tolerance, Decreased strength, Abnormal gait  Visit Diagnosis: Acute midline low back pain with left-sided sciatica  Difficulty in walking, not elsewhere classified  Muscle weakness  (generalized)  Acute pain of left knee  Repeated falls     Problem List Patient Active Problem List   Diagnosis Date Noted  . Lower GI bleed 07/31/2012  . DM2 (diabetes mellitus, type 2) (HCC) 07/31/2012  . HTN (hypertension) 07/31/2012    Sharmon Leyden, MPT 06/21/2017, 12:08 PM  [facility] [address] [place], [place], [address] Phone: [phone]   Fax:  [phone]  Name: [name] MRN: [id number] Date of Birth: October 17, 1928

## 2017-07-07 ENCOUNTER — Ambulatory Visit: Payer: Medicare Other | Admitting: Physical Therapy

## 2017-07-07 ENCOUNTER — Encounter: Payer: Self-pay | Admitting: Physical Therapy

## 2017-07-07 DIAGNOSIS — R296 Repeated falls: Secondary | ICD-10-CM

## 2017-07-07 DIAGNOSIS — M6281 Muscle weakness (generalized): Secondary | ICD-10-CM

## 2017-07-07 DIAGNOSIS — M5442 Lumbago with sciatica, left side: Secondary | ICD-10-CM

## 2017-07-07 DIAGNOSIS — R262 Difficulty in walking, not elsewhere classified: Secondary | ICD-10-CM

## 2017-07-07 DIAGNOSIS — M25562 Pain in left knee: Secondary | ICD-10-CM

## 2017-07-07 NOTE — Therapy (Signed)
[facility] [address] [name], [place], [address] Phone: [phone]   Fax:  [phone]  Physical Therapy Treatment  Patient Details  Name: [name] MRN: [id number] Date of Birth: 03/14/1929 Referring Provider: Renaye RakersVeita Bland, MD   Encounter Date: 07/07/2017  [name] End of Session - 07/07/17 0849    Visit Number  2    Number of Visits  12    Date for [name] Re-Evaluation  08/21/17    [name] Start Time  0849    [name] Stop Time  0927    [name] Time Calculation (min)  38 min    Activity Tolerance  Patient tolerated treatment well    Behavior During Therapy  [facility] tasks assessed/performed       Past Medical History:  Diagnosis Date  . Diabetes mellitus without complication (HCC)   . Diverticulitis   . Hypercholesterolemia   . Hypertension   . Shingles     Past Surgical History:  Procedure Laterality Date  . ABDOMINAL HYSTERECTOMY    . TOTAL HIP ARTHROPLASTY  2005   left sided    There were no vitals filed for this visit.  Subjective Assessment - 07/07/17 0849    Subjective  I felt good after last visit and HEP has been going well. Toothache today in lower back and Lt knee is acting out.     Currently in Pain?  Yes    Pain Score  7     Pain Location  Back    Pain Orientation  Lower    Pain Descriptors / Indicators  -- toothache                No data recorded       [facility] Adult [name] Treatment/Exercise - 07/07/17 0001      Exercises   Exercises  Lumbar      Lumbar Exercises: Stretches   Single Knee to Chest Stretch  Right;Left;30 seconds    Lower Trunk Rotation  4 reps;10 seconds    Gastroc Stretch  Right;Left;2 reps;30 seconds      Lumbar Exercises: Aerobic   Nustep  5 min L4 UE & LE      Lumbar Exercises: Standing   Other Standing Lumbar Exercises  gait- heel/toe, trunk rotation/arm swing, upright posture    Other Standing Lumbar Exercises  glut sets      Lumbar Exercises: Supine   AB Set  Limitations  heavy cuing-used physioball for aid    Bridge with [name]  15 reps               [name] Short Term Goals - 06/21/17 1158      [name] SHORT TERM GOAL #1   Title  [name] will be independent in her HEP    Time  3    Period  Weeks    Status  New    Target Date  07/15/17      [name] SHORT TERM GOAL #2   Title  [name] will be able to amb on community surfaces with no LOB with no device for >/1000 feet.     Time  3    Period  Weeks    Status  New    Target Date  07/15/17      [name] SHORT TERM GOAL #3   Title  [name] will report less pain with sit to stand of </= 3/10.     Time  3    Period  Weeks  Status  New    Target Date  07/15/17        [name] Long Term Goals - 06/21/17 1200      [name] LONG TERM GOAL #1   Title  [name] will improve her FOTO from limitation to </=    limitation.     Time  6    Period  Weeks    Status  New    Target Date  08/05/17      [name] LONG TERM GOAL #2   Title  [name] will improve her BERG balance score from 43/56 to >/= 50/56 to improve balance and decrease fall risk.     Time  6    Period  Weeks    Status  New    Target Date  08/05/17      [name] LONG TERM GOAL #3   Title  [name] will be able to report pain </= 2/10 when getting out of bed each morning.     Time  6    Period  Weeks    Status  New    Target Date  08/05/17      [name] LONG TERM GOAL #4   Title  [name] will improve left knee AROM flexion to >/= 115 degrees.     Time  6    Period  Weeks    Status  New    Target Date  08/05/17            Plan - 07/07/17 0926    Clinical Impression Statement  Multiple cues with different approaches to improve ability to engage core musculature. Asked [name] to work on heel-toe gait, appropriate bed mobility and squeezing gluts for upright posture. [name] reported feeling good after treatment.     [name] Treatment/Interventions  ADLs/Self Care Home Management;Cryotherapy;Electrical Stimulation;Iontophoresis 4mg /ml Dexamethasone;Moist Heat;Therapeutic exercise;Therapeutic  activities;Functional mobility training;Stair training;Gait training;Ultrasound;Balance training;Neuromuscular re-education;Patient/family education;Manual techniques;Passive range of motion    [name] Next Visit Plan  core/glut strengthening, balance challenges, gait obstacles    [name] Home Exercise Plan  QS, PPT, SKTC, supine marching, hamstring stretch seated    Consulted and Agree with Plan of Care  Patient       Patient will benefit from skilled therapeutic intervention in order to improve the following deficits and impairments:  Pain, Postural dysfunction, Decreased balance, Decreased mobility, Difficulty walking, Decreased range of motion, Decreased activity tolerance, Decreased strength, Abnormal gait  Visit Diagnosis: Acute midline low back pain with left-sided sciatica  Difficulty in walking, not elsewhere classified  Muscle weakness (generalized)  Acute pain of left knee  Repeated falls     Problem List Patient Active Problem List   Diagnosis Date Noted  . Lower GI bleed 07/31/2012  . DM2 (diabetes mellitus, type 2) (HCC) 07/31/2012  . HTN (hypertension) 07/31/2012   Marbin Olshefski C. Demitria Hay [name], DPT 07/07/17 9:28 AM   [facility] [address] [place], [name], [address] Phone: [phone]   Fax:  [phone]  Name: [name] MRN: [id number] Date of Birth: [birth year]

## 2017-07-13 ENCOUNTER — Ambulatory Visit: Payer: Medicare Other | Attending: Family Medicine | Admitting: Physical Therapy

## 2017-07-13 ENCOUNTER — Encounter: Payer: Self-pay | Admitting: Physical Therapy

## 2017-07-13 DIAGNOSIS — R296 Repeated falls: Secondary | ICD-10-CM

## 2017-07-13 DIAGNOSIS — M25562 Pain in left knee: Secondary | ICD-10-CM

## 2017-07-13 DIAGNOSIS — M5442 Lumbago with sciatica, left side: Secondary | ICD-10-CM | POA: Diagnosis present

## 2017-07-13 DIAGNOSIS — R262 Difficulty in walking, not elsewhere classified: Secondary | ICD-10-CM | POA: Diagnosis present

## 2017-07-13 DIAGNOSIS — M6281 Muscle weakness (generalized): Secondary | ICD-10-CM | POA: Diagnosis present

## 2017-07-13 NOTE — Therapy (Signed)
[facility] [address] [place], [place], [address] Phone: [phone]   Fax:  [phone]  Physical Therapy Treatment  Patient Details  Name: Hannah HuhCorine M [name] MRN: [id number] Date of Birth: 06-Dec-1928 Referring Provider: Renaye RakersVeita Bland, MD   Encounter Date: 07/13/2017  PT End of Session - 07/13/17 0941    Visit Number  3    Number of Visits  12    Date for PT Re-Evaluation  08/21/17    PT Start Time  0930    PT Stop Time  1015    PT Time Calculation (min)  45 min    Activity Tolerance  Patient tolerated treatment well    Behavior During Therapy  [facility] for tasks assessed/performed       Past Medical History:  Diagnosis Date  . Diabetes mellitus without complication (HCC)   . Diverticulitis   . Hypercholesterolemia   . Hypertension   . Shingles     Past Surgical History:  Procedure Laterality Date  . ABDOMINAL HYSTERECTOMY    . TOTAL HIP ARTHROPLASTY  2005   left sided    There were no vitals filed for this visit.  Subjective Assessment - 07/13/17 0936    Subjective  I feel good today, I have to take some tylenol from time to time to help with lower back.     Pertinent History  h/o right hip replacement years ago, h/o falls    Limitations  House hold activities;Walking    Currently in Pain?  Yes    Pain Score  6     Pain Location  Back    Pain Orientation  Lower    Pain Descriptors / Indicators  Aching    Pain Type  Acute pain    Pain Onset  More than a month ago    Pain Frequency  Constant    Multiple Pain Sites  No Pt had a injection in her knee yesterday                       [facility] Adult PT Treatment/Exercise - 07/13/17 0001      Exercises   Exercises  Lumbar      Lumbar Exercises: Stretches   Single Knee to Chest Stretch  Right;Left;30 seconds    Lower Trunk Rotation  4 reps;10 seconds    Gastroc Stretch  Right;Left;2 reps;30 seconds      Lumbar Exercises: Aerobic   Nustep  6 min L4  UE & LE      Lumbar Exercises: Standing   Heel Raises  15 reps;2 seconds    Other Standing Lumbar Exercises  gait- heel/toe, trunk rotation/arm swing, upright posture    Other Standing Lumbar Exercises  glut sets x 15, holding 5 seconds each, step ups on 4 inch step with bilateral UE support x 15      Lumbar Exercises: Supine   Bridge with Harley-DavidsonBall Squeeze  15 reps    Bridge with clamshell  15 reps    Straight Leg Raise  10 reps;2 seconds             PT Education - 07/13/17 0940    Education Details  Reviewed HEP verbally    Person(s) Educated  Patient    Methods  Explanation    Comprehension  Verbalized understanding       PT Short Term Goals - 07/13/17 0945      PT SHORT TERM GOAL #1   Title  Pt  will be independent in her HEP    Status  On-going      PT SHORT TERM GOAL #2   Title  Pt will be able to amb on community surfaces with no LOB with no device for >/1000 feet.     Time  3    Period  Weeks    Status  On-going        PT Long Term Goals - 06/21/17 1200      PT LONG TERM GOAL #1   Title  Pt will improve her FOTO from limitation to </=    limitation.     Time  6    Period  Weeks    Status  New    Target Date  08/05/17      PT LONG TERM GOAL #2   Title  Pt will improve her BERG balance score from 43/56 to >/= 50/56 to improve balance and decrease fall risk.     Time  6    Period  Weeks    Status  New    Target Date  08/05/17      PT LONG TERM GOAL #3   Title  Pt will be able to report pain </= 2/10 when getting out of bed each morning.     Time  6    Period  Weeks    Status  New    Target Date  08/05/17      PT LONG TERM GOAL #4   Title  Pt will improve left knee AROM flexion to >/= 115 degrees.     Time  6    Period  Weeks    Status  New    Target Date  08/05/17            Plan - 07/13/17 0943    Clinical Impression Statement  Pt tolerating exercises well today. Pt reporting less pain since starting therapy and is consistent about  performing her HEP. Pt able to review her HEP verbally. Pt concentrated today on core strengthening, LE strengthening and gait. Continue skilled PT to progress toward goals set.     Clinical Presentation  Evolving    Clinical Decision Making  Low    Rehab Potential  Good    PT Frequency  2x / week    PT Treatment/Interventions  ADLs/Self Care Home Management;Cryotherapy;Electrical Stimulation;Iontophoresis 4mg /ml Dexamethasone;Moist Heat;Therapeutic exercise;Therapeutic activities;Functional mobility training;Stair training;Gait training;Ultrasound;Balance training;Neuromuscular re-education;Patient/family education;Manual techniques;Passive range of motion    PT Next Visit Plan  core/glut strengthening, balance challenges, gait obstacles    PT Home Exercise Plan  QS, PPT, SKTC, supine marching, hamstring stretch seated    Consulted and Agree with Plan of Care  Patient       Patient will benefit from skilled therapeutic intervention in order to improve the following deficits and impairments:  Pain, Postural dysfunction, Decreased balance, Decreased mobility, Difficulty walking, Decreased range of motion, Decreased activity tolerance, Decreased strength, Abnormal gait  Visit Diagnosis: Acute midline low back pain with left-sided sciatica  Difficulty in walking, not elsewhere classified  Muscle weakness (generalized)  Acute pain of left knee  Repeated falls     Problem List Patient Active Problem List   Diagnosis Date Noted  . Lower GI bleed 07/31/2012  . DM2 (diabetes mellitus, type 2) (HCC) 07/31/2012  . HTN (hypertension) 07/31/2012    [name], MPT 07/13/2017, 10:15 AM  [facility] [address] [place], [place], [address] Phone: [phone]  Fax:  [phone]  Name: [name] MRN: [id number] Date of Birth: 16-Aug-1928

## 2017-07-16 ENCOUNTER — Ambulatory Visit: Payer: Medicare Other | Admitting: Physical Therapy

## 2017-07-16 ENCOUNTER — Encounter: Payer: Self-pay | Admitting: Physical Therapy

## 2017-07-16 DIAGNOSIS — R296 Repeated falls: Secondary | ICD-10-CM

## 2017-07-16 DIAGNOSIS — M5442 Lumbago with sciatica, left side: Secondary | ICD-10-CM

## 2017-07-16 DIAGNOSIS — M6281 Muscle weakness (generalized): Secondary | ICD-10-CM

## 2017-07-16 DIAGNOSIS — R262 Difficulty in walking, not elsewhere classified: Secondary | ICD-10-CM

## 2017-07-16 DIAGNOSIS — M25562 Pain in left knee: Secondary | ICD-10-CM

## 2017-07-16 NOTE — Therapy (Signed)
[facility] [address] [place], [place], [address] Phone: [phone]   Fax:  [phone]  Physical Therapy Treatment  Patient Details  Name: [name] MRN: [id number] Date of Birth: 08-Jul-1928 Referring Provider: Renaye RakersVeita Bland, MD   Encounter Date: 07/16/2017  PT End of Session - 07/16/17 0838    Visit Number  4    Number of Visits  12    Date for PT Re-Evaluation  08/21/17    PT Start Time  0838    PT Stop Time  0921    PT Time Calculation (min)  43 min    Activity Tolerance  Patient tolerated treatment well    Behavior During Therapy  [facility]/per[facility]       Past Medical History:  Diagnosis Date  . Diabetes mellitus without complication (HCC)   . Diverticulitis   . Hypercholesterolemia   . Hypertension   . Shingles     Past Surgical History:  Procedure Laterality Date  . ABDOMINAL HYSTERECTOMY    . TOTAL HIP ARTHROPLASTY  2005   left sided    There were no vitals filed for this visit.  Subjective Assessment - 07/16/17 0838    Subjective  I feel good today, it is getting a little better day by day.                        [facility] Adult PT Treatment/Exercise - 07/16/17 0001      Lumbar Exercises: Stretches   Passive Hamstring Stretch  Right;Left;2 reps;30 seconds    Lower Trunk Rotation  4 reps;10 seconds    Piriformis Stretch  Right;Left;20 seconds      Lumbar Exercises: Aerobic   Nustep  L4 UE & LE 6 min      Lumbar Exercises: Standing   Other Standing Lumbar Exercises  balance in //bars: NBOS, tandem, airex slow marching & NBOS    Other Standing Lumbar Exercises  wobble board: A/P & lat, static & dynamic      Lumbar Exercises: Supine   Bridge with Ball Squeeze  15 reps added to HEP      Lumbar Exercises: Sidelying   Clam  Both;20 reps               PT Short Term Goals - 07/13/17 0945      PT SHORT TERM GOAL #1   Title  Pt will be independent in her  HEP    Status  On-going      PT SHORT TERM GOAL #2   Title  Pt will be able to amb on community surfaces with no LOB with no device for >/1000 feet.     Time  3    Period  Weeks    Status  On-going        PT Long Term Goals - 06/21/17 1200      PT LONG TERM GOAL #1   Title  Pt will improve her FOTO from limitation to </=    limitation.     Time  6    Period  Weeks    Status  New    Target Date  08/05/17      PT LONG TERM GOAL #2   Title  Pt will improve her BERG balance score from 43/56 to >/= 50/56 to improve balance and decrease fall risk.     Time  6    Period  Weeks    Status  New    Target Date  08/05/17      PT LONG TERM GOAL #3   Title  Pt will be able to report pain </= 2/10 when getting out of bed each morning.     Time  6    Period  Weeks    Status  New    Target Date  08/05/17      PT LONG TERM GOAL #4   Title  Pt will improve left knee AROM flexion to >/= 115 degrees.     Time  6    Period  Weeks    Status  New    Target Date  08/05/17            Plan - 07/16/17 0907    Clinical Impression Statement  Added balance challenges today with cues to engage core/gluts to protect back. Poor balance control noted on unstable surfaces indicating poor use of lumbopelvic biomechanical chain in standing/walking.     PT Treatment/Interventions  ADLs/Self Care Home Management;Cryotherapy;Electrical Stimulation;Iontophoresis 4mg /ml Dexamethasone;Moist Heat;Therapeutic exercise;Therapeutic activities;Functional mobility training;Stair training;Gait training;Ultrasound;Balance training;Neuromuscular re-education;Patient/family education;Manual techniques;Passive range of motion    PT Next Visit Plan  cont standing challenges as tolerated, progress glut exercises    PT Home Exercise Plan  QS, PPT, SKTC, supine marching, hamstring stretch seated, bridge with ball, piriformis stretch    Consulted and Agree with Plan of Care  Patient       Patient will benefit from  skilled therapeutic intervention in order to improve the following deficits and impairments:  Pain, Postural dysfunction, Decreased balance, Decreased mobility, Difficulty walking, Decreased range of motion, Decreased activity tolerance, Decreased strength, Abnormal gait  Visit Diagnosis: Acute midline low back pain with left-sided sciatica  Difficulty in walking, not elsewhere classified  Muscle weakness (generalized)  Acute pain of left knee  Repeated falls     Problem List Patient Active Problem List   Diagnosis Date Noted  . Lower GI bleed 07/31/2012  . DM2 (diabetes mellitus, type 2) (HCC) 07/31/2012  . HTN (hypertension) 07/31/2012   Mickayla Trouten C. Danthony Kendrix PT, DPT 07/16/17 9:23 AM   [facility] [address] [place], [place], [address] Phone: [phone]   Fax:  [phone]  Name: [name] MRN: [id number] Date of Birth: Jul 04, 1928

## 2017-07-19 ENCOUNTER — Encounter: Payer: Self-pay | Admitting: Physical Therapy

## 2017-07-19 ENCOUNTER — Ambulatory Visit: Payer: Medicare Other | Admitting: Physical Therapy

## 2017-07-19 DIAGNOSIS — M5442 Lumbago with sciatica, left side: Secondary | ICD-10-CM | POA: Diagnosis not present

## 2017-07-19 DIAGNOSIS — R296 Repeated falls: Secondary | ICD-10-CM

## 2017-07-19 DIAGNOSIS — R262 Difficulty in walking, not elsewhere classified: Secondary | ICD-10-CM

## 2017-07-19 DIAGNOSIS — M25562 Pain in left knee: Secondary | ICD-10-CM

## 2017-07-19 DIAGNOSIS — M6281 Muscle weakness (generalized): Secondary | ICD-10-CM

## 2017-07-19 NOTE — Therapy (Signed)
[place], [place], [address] Phone: [phone]   Fax:  [phone]  Physical Therapy Treatment  Patient Details  Name: [name] MRN: [id number] Date of Birth: 10-Feb-1929 Referring Provider: Lucianne Lei, MD   Encounter Date: 07/19/2017  PT End of Session - 07/19/17 1056    Visit Number  5    Number of Visits  12    Date for PT Re-Evaluation  08/21/17    PT Start Time  0933    PT Stop Time  1015    PT Time Calculation (min)  42 min    Behavior During Therapy  [facility] for tasks assessed/performed       Past Medical History:  Diagnosis Date  . Diabetes mellitus without complication ([place])   . Diverticulitis   . Hypercholesterolemia   . Hypertension   . Shingles     Past Surgical History:  Procedure Laterality Date  . ABDOMINAL HYSTERECTOMY    . TOTAL HIP ARTHROPLASTY  2005   left sided    There were no vitals filed for this visit.  Subjective Assessment - 07/19/17 0938    Subjective  I have 6/10 pain.  I AM DOING PRETTY GOOD.  I am able to get up easier now.  No leg pain since she got a shot in Left knee on April 1st.     Pain Score  6     Pain Location  Back    Pain Orientation  Left;Lower    Pain Descriptors / Indicators  Aching    Pain Type  Acute pain    Pain Radiating Towards  NO    Aggravating Factors   standing to cook too long    Pain Score  6    Pain Location  Knee    Pain Orientation  Left    Aggravating Factors   sit to stand    Pain Relieving Factors  resting shot                       [facility] Adult PT Treatment/Exercise - 07/19/17 0001      High Level Balance   High Level Balance Activities  Head turns;Tandem walking;Marching forwards;Marching backwards;Negotiating over obstacles;Other (comment)    High Level Balance Comments  comlpiant and non compliant static and dynamic and eyes open/  closes,  narrow base.  She improved tandem static stand with  repitions.  Ues of bars needed intermittantly      Lumbar Exercises: Stretches   Gastroc Stretch  3 reps;30 seconds    Gastroc Stretch Limitations  SBA  to CGA on incline      Lumbar Exercises: Standing   Heel Raises  10 reps each heel and toe lifts holding counter               PT Short Term Goals - 07/19/17 1058      PT SHORT TERM GOAL #1   Title  Pt will be independent in her HEP    Time  3    Period  Weeks    Status  On-going      PT SHORT TERM GOAL #2   Title  Pt will be able to amb on community surfaces with no LOB with no device for >/1000 feet.     Baseline  able    Time  3    Period  Weeks    Status  Achieved      PT  SHORT TERM GOAL #3   Title  Pt will report less pain with sit to stand of </= 3/10.     Baseline  pain 6/10    Time  3    Period  Weeks    Status  On-going        PT Long Term Goals - 06/21/17 1200      PT LONG TERM GOAL #1   Title  Pt will improve her FOTO from limitation to </=    limitation.     Time  6    Period  Weeks    Status  New    Target Date  08/05/17      PT LONG TERM GOAL #2   Title  Pt will improve her BERG balance score from 43/56 to >/= 50/56 to improve balance and decrease fall risk.     Time  6    Period  Weeks    Status  New    Target Date  08/05/17      PT LONG TERM GOAL #3   Title  Pt will be able to report pain </= 2/10 when getting out of bed each morning.     Time  6    Period  Weeks    Status  New    Target Date  08/05/17      PT LONG TERM GOAL #4   Title  Pt will improve left knee AROM flexion to >/= 115 degrees.     Time  6    Period  Weeks    Status  New    Target Date  08/05/17            Plan - 07/19/17 1056    Clinical Impression Statement  Patient able to walk 1110 +  feet  in clinic without increased pain.  She notes she is back to walking longer distances in [place] etc.  STG# 2 met.   No pain increased during session today.  Balance challanges improve with repitition    PT Next  Visit Plan  cont standing challenges as tolerated, progress glut exercises    PT Home Exercise Plan  QS, PPT, SKTC, supine marching, hamstring stretch seated, bridge with ball, piriformis stretch    Consulted and Agree with Plan of Care  Patient       Patient will benefit from skilled therapeutic intervention in order to improve the following deficits and impairments:     Visit Diagnosis: Acute midline low back pain with left-sided sciatica  Difficulty in walking, not elsewhere classified  Muscle weakness (generalized)  Acute pain of left knee  Repeated falls     Problem List Patient Active Problem List   Diagnosis Date Noted  . Lower GI bleed 07/31/2012  . DM2 (diabetes mellitus, type 2) ([place]) 07/31/2012  . HTN (hypertension) 07/31/2012    Omario Ander PTA 07/19/2017, 11:00 AM  [facility] [address] [place], [place], [address] Phone: [phone]   Fax:  [phone]  Name: [name] MRN: [id number] Date of Birth: Aug 21, 1928

## 2017-07-21 ENCOUNTER — Encounter: Payer: Self-pay | Admitting: Physical Therapy

## 2017-07-21 ENCOUNTER — Ambulatory Visit: Payer: Medicare Other | Admitting: Physical Therapy

## 2017-07-21 DIAGNOSIS — M25562 Pain in left knee: Secondary | ICD-10-CM

## 2017-07-21 DIAGNOSIS — R296 Repeated falls: Secondary | ICD-10-CM

## 2017-07-21 DIAGNOSIS — R262 Difficulty in walking, not elsewhere classified: Secondary | ICD-10-CM

## 2017-07-21 DIAGNOSIS — M5442 Lumbago with sciatica, left side: Secondary | ICD-10-CM | POA: Diagnosis not present

## 2017-07-21 DIAGNOSIS — M6281 Muscle weakness (generalized): Secondary | ICD-10-CM

## 2017-07-21 NOTE — Therapy (Signed)
[facility] [address] [place], [place], [address] Phone: [phone]   Fax:  [phone]  Physical Therapy Treatment  Patient Details  Name: [name] MRN: [id number] Date of Birth: 08-30-1928 Referring Provider: Renaye Rakers, MD   Encounter Date: 07/21/2017  PT End of Session - 07/21/17 1132    Visit Number  6    Number of Visits  12    Date for PT Re-Evaluation  08/21/17    PT Start Time  0933    PT Stop Time  1015    PT Time Calculation (min)  42 min    Activity Tolerance  Patient tolerated treatment well    Behavior During Therapy  [facility] for tasks assessed/performed       Past Medical History:  Diagnosis Date  . Diabetes mellitus without complication (HCC)   . Diverticulitis   . Hypercholesterolemia   . Hypertension   . Shingles     Past Surgical History:  Procedure Laterality Date  . ABDOMINAL HYSTERECTOMY    . TOTAL HIP ARTHROPLASTY  2005   left sided    There were no vitals filed for this visit.  Subjective Assessment - 07/21/17 0957    Subjective  I have 6/10 pain.  I AM DOING PRETTY GOOD.  No pain getting out of bed this morning.  Has not used the back support lately.    Patient is accompained by:  Family member husband in lobby    Currently in Pain?  Yes    Pain Score  6     Pain Location  Back    Pain Orientation  Left;Lower    Pain Descriptors / Indicators  Aching    Pain Frequency  Constant    Aggravating Factors   standing to cook,      Pain Relieving Factors  rest    Multiple Pain Sites  No         [facility] PT Assessment - 07/21/17 0001      AROM   Left Knee Flexion  110                   [facility] Adult PT Treatment/Exercise - 07/21/17 0001      High Level Balance   High Level Balance Activities  Side stepping;Backward walking;Direction changes;Head turns;Tandem walking;Marching forwards;Marching backwards;Negotitating around obstacles    High Level Balance Comments   single leg with foot sliding,  narrow base,  static and dynamic alternating toe taps to object and more      Lumbar Exercises: Machines for Strengthening   Cybex Knee Flexion  15 LNS both 10 x 2 sets      Lumbar Exercises: Supine   Pelvic Tilt  10 reps cued initially    Clam  10 reps cued    Bent Knee Raise  10 reps cued,  practiced sequence with breathing    Bridge  10 reps      Knee/Hip Exercises: Supine   Heel Slides  10 reps;Both             PT Education - 07/21/17 1131    Education provided  Yes    Education Details  HEP    Person(s) Educated  Patient    Methods  Explanation;Demonstration;Verbal cues;Handout    Comprehension  Verbalized understanding;Returned demonstration       PT Short Term Goals - 07/19/17 1058      PT SHORT TERM GOAL #1   Title  Pt will be independent  in her HEP    Time  3    Period  Weeks    Status  On-going      PT SHORT TERM GOAL #2   Title  Pt will be able to amb on community surfaces with no LOB with no device for >/1000 feet.     Baseline  able    Time  3    Period  Weeks    Status  Achieved      PT SHORT TERM GOAL #3   Title  Pt will report less pain with sit to stand of </= 3/10.     Baseline  pain 6/10    Time  3    Period  Weeks    Status  On-going        PT Long Term Goals - 06/21/17 1200      PT LONG TERM GOAL #1   Title  Pt will improve her FOTO from limitation to </=    limitation.     Time  6    Period  Weeks    Status  New    Target Date  08/05/17      PT LONG TERM GOAL #2   Title  Pt will improve her BERG balance score from 43/56 to >/= 50/56 to improve balance and decrease fall risk.     Time  6    Period  Weeks    Status  New    Target Date  08/05/17      PT LONG TERM GOAL #3   Title  Pt will be able to report pain </= 2/10 when getting out of bed each morning.     Time  6    Period  Weeks    Status  New    Target Date  08/05/17      PT LONG TERM GOAL #4   Title  Pt will improve left knee AROM  flexion to >/= 115 degrees.     Time  6    Period  Weeks    Status  New    Target Date  08/05/17            Plan - 07/21/17 1132    Clinical Impression Statement  Patient SLS today 2-3 seconds best.  Balance challanges today were tolerated without pain increase.  She denied pain at end of session however her body language demo some pain.  She declined the need of modalities.  She is compliant with the HEP issued so far.    PT Next Visit Plan  cont standing challenges as tolerated, progress glut exercises.  review all HEP.  continue work on knee ROM    PT Home Exercise Plan  QS, PPT, SKTC, supine marching, hamstring stretch seated, bridge with ball, piriformis stretch  heel lifts    Consulted and Agree with Plan of Care  Patient       Patient will benefit from skilled therapeutic intervention in order to improve the following deficits and impairments:     Visit Diagnosis: Acute midline low back pain with left-sided sciatica  Difficulty in walking, not elsewhere classified  Muscle weakness (generalized)  Acute pain of left knee  Repeated falls     Problem List Patient Active Problem List   Diagnosis Date Noted  . Lower GI bleed 07/31/2012  . DM2 (diabetes mellitus, type 2) (HCC) 07/31/2012  . HTN (hypertension) 07/31/2012    HARRIS,KAREN PTA 07/21/2017, 11:39 AM  [facility] [address] [place], [place], [address] Phone: [phone]   Fax:  [phone]  Name: [name] MRN: [id number] Date of Birth: 08/25/1928

## 2017-07-21 NOTE — Patient Instructions (Signed)
CALF: Pump - Standing    Using support,rise up on balls of feet. Hold _3__ seconds. Repeat __10_ times. Do _1__ times per day.  Copyright  VHI. All rights reserved.

## 2017-07-26 ENCOUNTER — Encounter: Payer: Self-pay | Admitting: Physical Therapy

## 2017-07-26 ENCOUNTER — Ambulatory Visit: Payer: Medicare Other | Admitting: Physical Therapy

## 2017-07-26 DIAGNOSIS — R262 Difficulty in walking, not elsewhere classified: Secondary | ICD-10-CM

## 2017-07-26 DIAGNOSIS — M5442 Lumbago with sciatica, left side: Secondary | ICD-10-CM | POA: Diagnosis not present

## 2017-07-26 DIAGNOSIS — M6281 Muscle weakness (generalized): Secondary | ICD-10-CM

## 2017-07-26 NOTE — Therapy (Signed)
[facility] [address] [place], [place], [address] Phone: [phone]   Fax:  [phone]  Physical Therapy Treatment  Patient Details  Name: Hannah HuhCorine M Roell MRN: [id number] Date of Birth: 1929/03/05 Referring Provider: Renaye RakersVeita Bland, MD   Encounter Date: 07/26/2017  PT End of Session - 07/26/17 0934    Visit Number  7    Number of Visits  12    Date for PT Re-Evaluation  08/21/17    PT Start Time  0932    PT Stop Time  1012    PT Time Calculation (min)  40 min    Activity Tolerance  Patient tolerated treatment well    Behavior During Therapy  [facility] for tasks assessed/performed       Past Medical History:  Diagnosis Date  . Diabetes mellitus without complication (HCC)   . Diverticulitis   . Hypercholesterolemia   . Hypertension   . Shingles     Past Surgical History:  Procedure Laterality Date  . ABDOMINAL HYSTERECTOMY    . TOTAL HIP ARTHROPLASTY  2005   left sided    There were no vitals filed for this visit.  Subjective Assessment - 07/26/17 0936    Subjective  My back feels better but I still know it's there. I feel it most when I first get out of bed. We are aways busy, I feel it around 3 in the afternoon. [place].     Patient Stated Goals  Stop hurting    Currently in Pain?  No/denies                       [facility]/Exercise - 07/26/17 0001      Lumbar Exercises: Stretches   Passive Hamstring Stretch  Right;Left;30 seconds    Gastroc Stretch  Right;Left;2 reps;30 seconds slant board      Lumbar Exercises: Aerobic   Nustep  L5 UE & LE 5 min      Lumbar Exercises: Standing   Row  20 reps;Theraband    Theraband Level (Row)  Level 3 (Green)    Other Standing Lumbar Exercises  parallel bars: airex marching & static NBOS, tandem, step over & return      Lumbar Exercises: Seated   Sit to Stand  15 reps cues to use gluts, no UE assist      Lumbar Exercises: Supine   Other Supine Lumbar Exercises  morning stretches: hooklying breathing, LTR, heel slide, SKTC               PT Short Term Goals - 07/19/17 1058      PT SHORT TERM GOAL #1   Title  Pt will be independent in her HEP    Time  3    Period  Weeks    Status  On-going      PT SHORT TERM GOAL #2   Title  Pt will be able to amb on community surfaces with no LOB with no device for >/1000 feet.     Baseline  able    Time  3    Period  Weeks    Status  Achieved      PT SHORT TERM GOAL #3   Title  Pt will report less pain with sit to stand of </= 3/10.     Baseline  pain 6/10    Time  3    Period  Weeks  Status  On-going        PT Long Term Goals - 06/21/17 1200      PT LONG TERM GOAL #1   Title  Pt will improve her FOTO from limitation to </=    limitation.     Time  6    Period  Weeks    Status  New    Target Date  08/05/17      PT LONG TERM GOAL #2   Title  Pt will improve her BERG balance score from 43/56 to >/= 50/56 to improve balance and decrease fall risk.     Time  6    Period  Weeks    Status  New    Target Date  08/05/17      PT LONG TERM GOAL #3   Title  Pt will be able to report pain </= 2/10 when getting out of bed each morning.     Time  6    Period  Weeks    Status  New    Target Date  08/05/17      PT LONG TERM GOAL #4   Title  Pt will improve left knee AROM flexion to >/= 115 degrees.     Time  6    Period  Weeks    Status  New    Target Date  08/05/17            Plan - 07/26/17 1112    Clinical Impression Statement  Improved gait pattern but continues to slightly flex at waist with fatigue resutling in continued back pain later in the day. Pt reports improved ability to run errands with less pain. Created a 4 stretch morning program to reduce AM stiffness.     PT Treatment/Interventions  ADLs/Self Care Home Management;Cryotherapy;Electrical Stimulation;Iontophoresis 4mg /ml Dexamethasone;Moist  Heat;Therapeutic exercise;Therapeutic activities;Functional mobility training;Stair training;Gait training;Ultrasound;Balance training;Neuromuscular re-education;Patient/family education;Manual techniques;Passive range of motion    PT Next Visit Plan  cont standing challenges as tolerated, progress glut exercises.  review all HEP.  continue work on knee ROM    PT Home Exercise Plan  QS, PPT, SKTC, supine marching, hamstring stretch seated, bridge with ball, piriformis stretch  heel lifts, morning stretches    Consulted and Agree with Plan of Care  Patient       Patient will benefit from skilled therapeutic intervention in order to improve the following deficits and impairments:  Pain, Postural dysfunction, Decreased balance, Decreased mobility, Difficulty walking, Decreased range of motion, Decreased activity tolerance, Decreased strength, Abnormal gait  Visit Diagnosis: Acute midline low back pain with left-sided sciatica  Difficulty in walking, not elsewhere classified  Muscle weakness (generalized)     Problem List Patient Active Problem List   Diagnosis Date Noted  . Lower GI bleed 07/31/2012  . DM2 (diabetes mellitus, type 2) (HCC) 07/31/2012  . HTN (hypertension) 07/31/2012    Omid Deardorff C. Zyanya Glaza PT, DPT 07/26/17 11:20 AM   [facility] [address] [place], [place], [address] Phone: [phone]   Fax:  [phone]  Name: [name] MRN: [id number] Date of Birth: Aug 22, 1928

## 2017-07-28 ENCOUNTER — Ambulatory Visit: Payer: Medicare Other | Admitting: Physical Therapy

## 2017-07-28 ENCOUNTER — Encounter: Payer: Self-pay | Admitting: Physical Therapy

## 2017-07-28 DIAGNOSIS — M6281 Muscle weakness (generalized): Secondary | ICD-10-CM

## 2017-07-28 DIAGNOSIS — M5442 Lumbago with sciatica, left side: Secondary | ICD-10-CM

## 2017-07-28 DIAGNOSIS — R262 Difficulty in walking, not elsewhere classified: Secondary | ICD-10-CM

## 2017-07-28 DIAGNOSIS — M25562 Pain in left knee: Secondary | ICD-10-CM

## 2017-07-28 DIAGNOSIS — R296 Repeated falls: Secondary | ICD-10-CM

## 2017-07-28 NOTE — Therapy (Signed)
[place], [place], [address] Phone: [phone]   Fax:  [phone]  Physical Therapy Treatment  Patient Details  Name: [name] MRN: [id number] Date of Birth: 01-29-1929 Referring Provider: Lucianne Lei, MD   Encounter Date: 07/28/2017  PT End of Session - 07/28/17 1254    Visit Number  8    Number of Visits  12    Date for PT Re-Evaluation  08/21/17    PT Start Time  0932    PT Stop Time  1016    PT Time Calculation (min)  44 min    Activity Tolerance  Patient tolerated treatment well    Behavior During Therapy  [facility] for tasks assessed/performed       Past Medical History:  Diagnosis Date  . Diabetes mellitus without complication ([place])   . Diverticulitis   . Hypercholesterolemia   . Hypertension   . Shingles     Past Surgical History:  Procedure Laterality Date  . ABDOMINAL HYSTERECTOMY    . TOTAL HIP ARTHROPLASTY  2005   left sided    There were no vitals filed for this visit.  Subjective Assessment - 07/28/17 0937    Subjective  i was able to do the exercises and got along fine.  I did them with my husband.    It is easier to cook.  balance is a little better.  i am getting up steps a little better.     Currently in Pain?  Yes    Pain Score  5     Pain Location  Back    Pain Orientation  Left;Lower    Pain Descriptors / Indicators  Aching back was itching  and it felt like it had a fevor last week  alcohol helped     Pain Frequency  Intermittent    Pain Relieving Factors  rest    Multiple Pain Sites  No    Pain Location  Knee    Pain Orientation  Left    Pain Relieving Factors  exercise                       [facility] Adult PT Treatment/Exercise - 07/28/17 0001      Ambulation/Gait   Gait Comments  1 lap inside building, cued for rotation and posture was more upright,    Also had resisted walking 10 feet X 3 pulling PTA.   without increased pain.       Lumbar  Exercises: Aerobic   Nustep  L5 UE & LE 5 min      Lumbar Exercises: Standing   Other Standing Lumbar Exercises  mini squats with side steps .  cued initially    Other Standing Lumbar Exercises  tandem,  static and dynamic balance exercises eyes open clised,  weight shift       Lumbar Exercises: Seated   Sit to Stand  10 reps      Knee/Hip Exercises: Seated   Heel Slides  10 reps feet rolling foam roller ,  knee flexion  with small steps.    Ball Squeeze  10    Clamshell with TheraBand  Green    Sit to [employer]  10 reps cued knee control in extension             PT Education - 07/28/17 1254    Education provided  Yes    Education Details  gait    Methods  Explanation;Demonstration    Comprehension  Verbalized understanding;Returned demonstration       PT Short Term Goals - 07/28/17 1300      PT SHORT TERM GOAL #1   Title  Pt will be independent in her HEP    Baseline  able to do no problems with her husband    Time  3    Period  Weeks    Status  Achieved      PT SHORT TERM GOAL #2   Title  Pt will be able to amb on community surfaces with no LOB with no device for >/1000 feet.     Baseline  able    Time  3    Period  Weeks    Status  Achieved      PT SHORT TERM GOAL #3   Title  Pt will report less pain with sit to stand of </= 3/10.     Baseline  able to do,  did not complain of pain    Time  3    Period  Weeks    Status  On-going        PT Long Term Goals - 06/21/17 1200      PT LONG TERM GOAL #1   Title  Pt will improve her FOTO from limitation to </=    limitation.     Time  6    Period  Weeks    Status  New    Target Date  08/05/17      PT LONG TERM GOAL #2   Title  Pt will improve her BERG balance score from 43/56 to >/= 50/56 to improve balance and decrease fall risk.     Time  6    Period  Weeks    Status  New    Target Date  08/05/17      PT LONG TERM GOAL #3   Title  Pt will be able to report pain </= 2/10 when getting out of bed each  morning.     Time  6    Period  Weeks    Status  New    Target Date  08/05/17      PT LONG TERM GOAL #4   Title  Pt will improve left knee AROM flexion to >/= 115 degrees.     Time  6    Period  Weeks    Status  New    Target Date  08/05/17            Plan - 07/28/17 1257    Clinical Impression Statement  Patient continues to demonstrate improved gait with more upright posture.   Patient was able to walk a full lap inside the building at a normal pace with 3/10 pain.  Balance ex continue to be a challange.  STG#1 met.    PT Next Visit Plan  cont standing challenges as tolerated, progress glut exercises.  review all HEP.  continue work on knee ROM    PT Home Exercise Plan  QS, PPT, SKTC, supine marching, hamstring stretch seated, bridge with ball, piriformis stretch  heel lifts, morning stretches    Consulted and Agree with Plan of Care  Patient       Patient will benefit from skilled therapeutic intervention in order to improve the following deficits and impairments:     Visit Diagnosis: Acute midline low back pain with left-sided sciatica  Difficulty in walking, not elsewhere classified  Muscle weakness (generalized)  Acute  pain of left knee  Repeated falls     Problem List Patient Active Problem List   Diagnosis Date Noted  . Lower GI bleed 07/31/2012  . DM2 (diabetes mellitus, type 2) ([place]) 07/31/2012  . HTN (hypertension) 07/31/2012    HARRIS,KAREN PTA 07/28/2017, 1:03 PM  [facility] [address] [place], [place], [address] Phone: [phone]   Fax:  [phone]  Name: EMALYN SCHOU MRN: [id number] Date of Birth: Sep 14, 1928

## 2017-08-02 ENCOUNTER — Encounter: Payer: Self-pay | Admitting: Physical Therapy

## 2017-08-02 ENCOUNTER — Ambulatory Visit: Payer: Medicare Other | Admitting: Physical Therapy

## 2017-08-02 DIAGNOSIS — M6281 Muscle weakness (generalized): Secondary | ICD-10-CM

## 2017-08-02 DIAGNOSIS — M5442 Lumbago with sciatica, left side: Secondary | ICD-10-CM

## 2017-08-02 DIAGNOSIS — R296 Repeated falls: Secondary | ICD-10-CM

## 2017-08-02 DIAGNOSIS — M25562 Pain in left knee: Secondary | ICD-10-CM

## 2017-08-02 DIAGNOSIS — R262 Difficulty in walking, not elsewhere classified: Secondary | ICD-10-CM

## 2017-08-02 NOTE — Therapy (Signed)
[facility] [address] [place], [place], [address] Phone: [phone]   Fax:  [phone]  Physical Therapy Treatment  Patient Details  Name: [name] MRN: [id number] Date of Birth: Nov 14, 1928 Referring Provider: Renaye Rakers, MD   Encounter Date: 08/02/2017  PT End of Session - 08/02/17 1027    Visit Number  9    Number of Visits  12    Date for PT Re-Evaluation  08/21/17    PT Start Time  0930    PT Stop Time  1015    PT Time Calculation (min)  45 min    Activity Tolerance  Patient tolerated treatment well    Behavior During Therapy  [facility] for tasks assessed/performed       Past Medical History:  Diagnosis Date  . Diabetes mellitus without complication ([facility])   . Diverticulitis   . Hypercholesterolemia   . Hypertension   . Shingles     Past Surgical History:  Procedure Laterality Date  . ABDOMINAL HYSTERECTOMY    . TOTAL HIP ARTHROPLASTY  2005   left sided    There were no vitals filed for this visit.  Subjective Assessment - 08/02/17 0933    Subjective  I was able to cook Easter dinner on Sat.   Less pain with getting up in knee.  I don't have too hold onto anything when I get up from sitting.    Currently in Pain?  Yes    Pain Score  4     Pain Location  Back    Pain Orientation  Left;Lower    Pain Descriptors / Indicators  Aching    Pain Type  Acute pain    Pain Frequency  Intermittent    Aggravating Factors   unclear this morning     Pain Relieving Factors  rest    Pain Score  6    Pain Location  Knee    Pain Orientation  Left    Pain Type  Acute pain    Aggravating Factors   unclear with aggravating knee.  Sit to stand no longer painful    Pain Relieving Factors  exercise                       [facility] Adult PT Treatment/Exercise - 08/02/17 0001      High Level Balance   High Level Balance Comments  various balabce exercises lstatic , dynamic,  wide and narrowed base  single  leg and eyeys closed.  balance improving able to take high march steps without loss of balance.  eyes closed 10 seconds.        Lumbar Exercises: Stretches   Passive Hamstring Stretch  3 reps;30 seconds    Hip Flexor Stretch  3 reps;30 seconds right and left.  right tight.  able to lengthen    Quad Stretch  3 reps;30 seconds off ege of mat  with quad strumming  bech    Gastroc Stretch  2 reps;20 seconds incline    Other Lumbar Stretch Exercise  Good morning stretch 10 seconds 3 X cues initially.  able to decrease back arch with stretchjes      Lumbar Exercises: Aerobic   Nustep  L5 UE & LE 5 min      Lumbar Exercises: Standing   Other Standing Lumbar Exercises  tandem,  static and dynamic balance exercises eyes open clised,  weight shift       Lumbar Exercises: Supine  Glut Set  10 reps    Bridge  10 reps      Knee/Hip Exercises: Seated   Sit to [employer]  10 reps             PT Education - 08/02/17 1027    Education provided  Yes    Education Details  Exercise form    Person(s) Educated  Patient    Methods  Explanation    Comprehension  Verbalized understanding;Returned demonstration       PT Short Term Goals - 07/28/17 1300      PT SHORT TERM GOAL #1   Title  Pt will be independent in her HEP    Baseline  able to do no problems with her husband    Time  3    Period  Weeks    Status  Achieved      PT SHORT TERM GOAL #2   Title  Pt will be able to amb on community surfaces with no LOB with no device for >/1000 feet.     Baseline  able    Time  3    Period  Weeks    Status  Achieved      PT SHORT TERM GOAL #3   Title  Pt will report less pain with sit to stand of </= 3/10.     Baseline  able to do,  did not complain of pain    Time  3    Period  Weeks    Status  On-going        PT Long Term Goals - 08/02/17 1030      PT LONG TERM GOAL #1   Time  6    Period  Weeks    Status  Unable to assess      PT LONG TERM GOAL #2   Title  Pt will improve her  BERG balance score from 43/56 to >/= 50/56 to improve balance and decrease fall risk.     Time  6    Period  Weeks    Status  Unable to assess      PT LONG TERM GOAL #3   Title  Pt will be able to report pain </= 2/10 when getting out of bed each morning.     Time  6    Period  Weeks    Status  Unable to assess      PT LONG TERM GOAL #4   Title  Pt will improve left knee AROM flexion to >/= 115 degrees.     Time  6    Period  Weeks    Status  Unable to assess            Plan - 08/02/17 1028    Clinical Impression Statement  No pain at end of session.  She was able to lengthen quads and hip extensing to have less bowing back  in supine.  Her husband noticed she has been able to stand straighter.  They exercise together.    PT Next Visit Plan 10 visit note.   BERG??  short session.  cont standing challenges as tolerated, progress glut exercises.  review all HEP.  continue work on knee ROM.  She needs to levat at 1015 next visit.      PT Home Exercise Plan  QS, PPT, SKTC, supine marching, hamstring stretch seated, bridge with ball, piriformis stretch  heel lifts, morning stretches    Consulted and Agree with Plan of Care  Patient       Patient will benefit from skilled therapeutic intervention in order to improve the following deficits and impairments:     Visit Diagnosis: Acute midline low back pain with left-sided sciatica  Difficulty in walking, not elsewhere classified  Muscle weakness (generalized)  Acute pain of left knee  Repeated falls     Problem List Patient Active Problem List   Diagnosis Date Noted  . Lower GI bleed 07/31/2012  . DM2 (diabetes mellitus, type 2) ([facility]) 07/31/2012  . HTN (hypertension) 07/31/2012    [name]   PTA 08/02/2017, 10:32 AM  [facility] [address] [place], [place], [address] Phone: [phone]   Fax:  [phone]  Name: Hannah HuhCorine M [name] MRN: [id number] Date of  Birth: April 28, 1928

## 2017-08-04 ENCOUNTER — Encounter: Payer: Self-pay | Admitting: Physical Therapy

## 2017-08-04 ENCOUNTER — Ambulatory Visit: Payer: Medicare Other | Admitting: Physical Therapy

## 2017-08-04 DIAGNOSIS — M5442 Lumbago with sciatica, left side: Secondary | ICD-10-CM | POA: Diagnosis not present

## 2017-08-04 DIAGNOSIS — M6281 Muscle weakness (generalized): Secondary | ICD-10-CM

## 2017-08-04 DIAGNOSIS — R262 Difficulty in walking, not elsewhere classified: Secondary | ICD-10-CM

## 2017-08-04 DIAGNOSIS — R296 Repeated falls: Secondary | ICD-10-CM

## 2017-08-04 DIAGNOSIS — M25562 Pain in left knee: Secondary | ICD-10-CM

## 2017-08-04 NOTE — Therapy (Signed)
[place], [place], [address] Phone: [phone]   Fax:  [phone]  Physical Therapy Treatment/Discharge Summary  Progress Note Reporting Period 06/21/2017 to 08/04/2017  See note below for Objective Data and Assessment of Progress/Goals.   Patient Details  Name: [name] MRN: [id number] Date of Birth: July 08, 1928 Referring Provider: Lucianne Lei, MD   Encounter Date: 08/04/2017  PT End of Session - 08/04/17 0930    Visit Number  10    Number of Visits  12    Date for PT Re-Evaluation  08/21/17    PT Start Time  0928    PT Stop Time  0959    PT Time Calculation (min)  31 min    Activity Tolerance  Patient tolerated treatment well    Behavior During Therapy  [facility] for tasks assessed/performed       Past Medical History:  Diagnosis Date  . Diabetes mellitus without complication ([place])   . Diverticulitis   . Hypercholesterolemia   . Hypertension   . Shingles     Past Surgical History:  Procedure Laterality Date  . ABDOMINAL HYSTERECTOMY    . TOTAL HIP ARTHROPLASTY  2005   left sided    There were no vitals filed for this visit.  Subjective Assessment - 08/04/17 1000    Subjective  Feeling much better, my husband stays on me about doing my exercises.     Currently in Pain?  No/denies         [facility] PT Assessment - 08/04/17 0001      Assessment   Medical Diagnosis  low back pain s/p fall with compression fx of lumbar spine    Referring Provider  Lucianne Lei, MD      Observation/Other Assessments   Focus on Therapeutic Outcomes (FOTO)   25% limitation      AROM   Left Knee Flexion  120      Strength   Right Knee Flexion  5/5    Right Knee Extension  5/5    Left Knee Flexion  5/5    Left Knee Extension  5/5      Berg Balance Test   Sit to Stand  Able to stand without using hands and stabilize independently    Standing Unsupported  Able to stand safely 2 minutes    Sitting  with Back Unsupported but Feet Supported on Floor or Stool  Able to sit safely and securely 2 minutes    Stand to Sit  Sits safely with minimal use of hands    Transfers  Able to transfer safely, minor use of hands    Standing Unsupported with Eyes Closed  Able to stand 10 seconds safely    Standing Ubsupported with Feet Together  Able to place feet together independently and stand 1 minute safely    From Standing, Reach Forward with Outstretched Arm  Can reach forward >12 cm safely (5")    From Standing Position, Pick up Object from Floor  Able to pick up shoe safely and easily    From Standing Position, Turn to Look Behind Over each Shoulder  Looks behind from both sides and weight shifts well    Turn 360 Degrees  Able to turn 360 degrees safely in 4 seconds or less    Standing Unsupported, Alternately Place Feet on Step/Stool  Able to stand independently and complete 8 steps >20 seconds    Standing Unsupported, One Foot in [place] to  take small step independently and hold 30 seconds    Standing on One Leg  Able to lift leg independently and hold equal to or more than 3 seconds    Total Score  50                   [facility] Adult PT Treatment/Exercise - 08/04/17 0001      Exercises   Exercises  Other Exercises    Other Exercises   HEP review      Lumbar Exercises: Aerobic   Nustep  L5 5 min LE only             PT Education - 08/04/17 1000    Education provided  Yes    Education Details  HEP, goals & progress, importance of continued HEP    Person(s) Educated  Patient    Methods  Explanation;Handout    Comprehension  Verbalized understanding       PT Short Term Goals - 07/28/17 1300      PT SHORT TERM GOAL #1   Title  Pt will be independent in her HEP    Baseline  able to do no problems with her husband    Time  3    Period  Weeks    Status  Achieved      PT SHORT TERM GOAL #2   Title  Pt will be able to amb on community surfaces with no LOB with no  device for >/1000 feet.     Baseline  able    Time  3    Period  Weeks    Status  Achieved      PT SHORT TERM GOAL #3   Title  Pt will report less pain with sit to stand of </= 3/10.     Baseline  able to do,  did not complain of pain    Time  3    Period  Weeks    Status  On-going        PT Long Term Goals - 08/04/17 0932      PT LONG TERM GOAL #1   Title  Pt will improve her FOTO from limitation to </=    limitation.     Baseline  25% limited    Status  Achieved      PT LONG TERM GOAL #2   Title  Pt will improve her BERG balance score from 43/56 to >/= 50/56 to improve balance and decrease fall risk.     Baseline  50/56    Status  Achieved      PT LONG TERM GOAL #3   Title  Pt will be able to report pain </= 2/10 when getting out of bed each morning.     Baseline  it is very good    Status  Achieved      PT LONG TERM GOAL #4   Title  Pt will improve left knee AROM flexion to >/= 115 degrees.     Baseline  120    Status  Achieved            Plan - 08/04/17 1001    Clinical Impression Statement  Pt has met all of her goals at this time and is now d/c to independent program. Pt reports she feels both physically and mentally better. Encouraged her to continue HEP to make a habit of it. Pt verbalized comfort and understanding and was encouraged to contact us with any further questions.  PT Treatment/Interventions  ADLs/Self Care Home Management;Cryotherapy;Electrical Stimulation;Iontophoresis 55m/ml Dexamethasone;Moist Heat;Therapeutic exercise;Therapeutic activities;Functional mobility training;Stair training;Gait training;Ultrasound;Balance training;Neuromuscular re-education;Patient/family education;Manual techniques;Passive range of motion    PT Home Exercise Plan  QS, PPT, SKTC, supine marching, hamstring stretch seated, bridge with ball, piriformis stretch  heel lifts, morning stretches    Consulted and Agree with Plan of Care  Patient       Patient will  benefit from skilled therapeutic intervention in order to improve the following deficits and impairments:  Pain, Postural dysfunction, Decreased balance, Decreased mobility, Difficulty walking, Decreased range of motion, Decreased activity tolerance, Decreased strength, Abnormal gait  Visit Diagnosis: Acute midline low back pain with left-sided sciatica  Difficulty in walking, not elsewhere classified  Muscle weakness (generalized)  Acute pain of left knee  Repeated falls     Problem List Patient Active Problem List   Diagnosis Date Noted  . Lower GI bleed 07/31/2012  . DM2 (diabetes [place], type 2) ([place] 07/31/2012  . HTN (hypertension) 07/31/2012    PHYSICAL THERAPY DISCHARGE SUMMARY  Visits from Start of Care: 10  Current functional level related to goals / functional outcomes: See above   Remaining deficits: See above   Education / Equipment: Anatomy of condition, POC, HEP, exercise form/[place]  Plan: Patient agrees to discharge.  Patient goals were met. Patient is being discharged due to meeting the stated rehab goals.  ?????      C.  PT, DPT 08/04/17 10:04 AM   [place] [address]: [phone]  Fax:  [phone] Name: Hannah GOTWALTMRN: [id number] of Birth: [birth year]

## 2017-09-09 ENCOUNTER — Encounter: Payer: Self-pay | Admitting: Physical Therapy

## 2017-09-09 ENCOUNTER — Ambulatory Visit: Payer: Medicare Other | Attending: Family Medicine | Admitting: Physical Therapy

## 2017-09-09 DIAGNOSIS — M545 Low back pain, unspecified: Secondary | ICD-10-CM

## 2017-09-09 DIAGNOSIS — R262 Difficulty in walking, not elsewhere classified: Secondary | ICD-10-CM | POA: Diagnosis present

## 2017-09-09 DIAGNOSIS — G8929 Other chronic pain: Secondary | ICD-10-CM | POA: Insufficient documentation

## 2017-09-09 DIAGNOSIS — M6281 Muscle weakness (generalized): Secondary | ICD-10-CM | POA: Diagnosis present

## 2017-09-09 NOTE — Therapy (Signed)
[facility] [address] [place], [place], [address] Phone: [phone]   Fax:  [phone]  Physical Therapy Evaluation  Patient Details  Name: [name] MRN: [id number] Date of Birth: 1928/07/23 Referring Provider: Dr. Renaye Rakers    Encounter Date: 09/09/2017  PT End of Session - 09/09/17 1202    Visit Number  1    Number of Visits  6    Date for PT Re-Evaluation  10/21/17    PT Start Time  1145    PT Stop Time  1226    PT Time Calculation (min)  41 min    Activity Tolerance  Patient tolerated treatment well    Behavior During Therapy  [facility] for tasks assessed/performed       Past Medical History:  Diagnosis Date  . Diabetes mellitus without complication (HCC)   . Diverticulitis   . Hypercholesterolemia   . Hypertension   . Shingles     Past Surgical History:  Procedure Laterality Date  . ABDOMINAL HYSTERECTOMY    . TOTAL HIP ARTHROPLASTY  2005   left sided    There were no vitals filed for this visit.   Subjective Assessment - 09/09/17 1152    Subjective  They sent me back.  Dr. Renaye Rakers referral.  Back pain returns and she noticed an increase in pain.  She has to do what she needs but she just has to go slow.  Denies numbness , weakness and radiating pain.  Hard to get up the steps to her home.  Its only 3 steps     How long can you stand comfortably?  2 hours    How long can you walk comfortably?  can walk 30 min no problem    Currently in Pain?  Yes    Pain Score  3     Pain Location  Back    Pain Orientation  Lower;Left    Pain Descriptors / Indicators  Aching    Pain Type  Chronic pain    Pain Onset  More than a month ago    Pain Frequency  Intermittent    Aggravating Factors   going up and down stairs     Pain Relieving Factors  rest         [facility] PT Assessment - 09/09/17 0001      Assessment   Medical Diagnosis  Low back pain     Referring Provider  Dr. Renaye Rakers     Onset  Date/Surgical Date  05/03/17    Prior Therapy  just DC 08/04/17      Precautions   Precautions  None      Restrictions   Weight Bearing Restrictions  No      Balance Screen   Has the patient fallen in the past 6 months  No    How many times?  -- fell In Jan     Has the patient had a decrease in activity level because of a fear of falling?   No    Is the patient reluctant to leave their home because of a fear of falling?   No      Home Nurse, mental health  Private residence    Living Arrangements  Spouse/significant other    Available Help at Discharge  Family    Type of Home  House    Home Access  Stairs to enter    Entrance Stairs-Number of Steps  3  Entrance Stairs-Rails  Right      Prior Function   Level of Independence  Independent    Vocation  Retired    Leisure  Bible study, watching sports,      Cognition   Overall Cognitive Status  Within Functional Limits for tasks assessed      Observation/Other Assessments   Focus on Therapeutic Outcomes (FOTO)   NT       Sensation   Light Touch  Appears Intact      Posture/Postural Control   Posture/Postural Control  Postural limitations    Postural Limitations  Rounded Shoulders;Forward head;Flexed trunk      AROM   Lumbar Flexion  WFL    Lumbar Extension  to neutral     Lumbar - Right Side Bend  50%    Lumbar - Left Side Bend  50%    Lumbar - Right Rotation  WFL     Lumbar - Left Rotation  [facility]       Strength   Right Knee Flexion  5/5    Right Knee Extension  5/5    Left Knee Flexion  5/5    Left Knee Extension  4+/5      Flexibility   Soft Tissue Assessment /Muscle Length  -- good throughout       Palpation   Palpation comment  Non tender in paraspinals       Berg Balance Test   Sit to Stand  Able to stand without using hands and stabilize independently    Standing Unsupported  Able to stand safely 2 minutes    Sitting with Back Unsupported but Feet Supported on Floor or Stool  Able to sit  safely and securely 2 minutes    Stand to Sit  Sits safely with minimal use of hands    Transfers  Able to transfer safely, minor use of hands    Standing Unsupported with Eyes Closed  Able to stand 10 seconds safely    Standing Ubsupported with Feet Together  Able to place feet together independently and stand 1 minute safely    From Standing, Reach Forward with Outstretched Arm  Can reach forward >12 cm safely (5")    From Standing Position, Pick up Object from Floor  Able to pick up shoe, needs supervision    From Standing Position, Turn to Look Behind Over each Shoulder  Looks behind from both sides and weight shifts well    Turn 360 Degrees  Able to turn 360 degrees safely in 4 seconds or less    Standing Unsupported, Alternately Place Feet on Step/Stool  Able to complete >2 steps/needs minimal assist    Standing Unsupported, One Foot in Front  Able to take small step independently and hold 30 seconds    Standing on One Leg  Tries to lift leg/unable to hold 3 seconds but remains standing independently    Total Score  46                Objective measurements completed on examination: See above findings.              PT Education - 09/09/17 1245    Education provided  Yes    Education Details  PT, body mechanics with lifting (during [place]), reprint HEP, POC , eval findings     Person(s) Educated  Patient    Methods  Explanation    Comprehension  Verbalized understanding       PT Short Term Goals -  07/28/17 1300      PT SHORT TERM GOAL #1   Title  Pt will be independent in her HEP    Baseline  able to do no problems with her husband    Time  3    Period  Weeks    Status  Achieved      PT SHORT TERM GOAL #2   Title  Pt will be able to amb on community surfaces with no LOB with no device for >/1000 feet.     Baseline  able    Time  3    Period  Weeks    Status  Achieved      PT SHORT TERM GOAL #3   Title  Pt will report less pain with sit to stand of </=  3/10.     Baseline  able to do,  did not complain of pain    Time  3    Period  Weeks    Status  On-going        PT Long Term Goals - 09/09/17 1246      PT LONG TERM GOAL #1   Title  Pt will be I with HEP for posture, lifting and balance     Time  6    Period  Weeks    Status  New    Target Date  10/21/17      PT LONG TERM GOAL #2   Title  Pt will improve her BERG balance score from 46/56 to >/= 50/56 to improve balance and decrease fall risk.     Time  6    Period  Weeks    Status  New    Target Date  10/21/17      PT LONG TERM GOAL #3   Title  Pt will be able to report pain </= 2/10 when getting out of bed each morning.     Time  6    Period  Weeks    Status  New      PT LONG TERM GOAL #4   Title  pt will safely demo lifting from the floor with no cues for light items     Time  6    Period  Weeks    Status  New    Target Date  10/21/17             Plan - 09/09/17 1316    Clinical Impression Statement  Patient returns to PT after about 5 weeks after discharge.  She had an increase in pain and she did not do her HEP as consisently as she should have.  She showed poor lifting techniques despite cues.  Her balance score was decreased 4 points 46/56 (min fall risk).  She will be seen for 4-6 visits and no longer.  We will help get her transtioned into a community exercise group to maintain results.      Clinical Presentation  Stable    Clinical Decision Making  Low    Rehab Potential  Excellent    PT Frequency  1x / week    PT Duration  6 weeks    PT Treatment/Interventions  ADLs/Self Care Home Management;Cryotherapy;Electrical Stimulation;Iontophoresis /ml Dexamethasone;Moist Heat;Therapeutic exercise;Therapeutic activities;Functional mobility training;Stair training;Gait training;Ultrasound;Balance training;Neuromuscular re-education;Patient/family education;Manual techniques;Passive range of motion    PT Next Visit Plan  review HEP.  simplify.  Body mechanics  , lifting     PT Home Exercise Plan  QS, PPT, SKTC, supine marching, hamstring stretch seated,  bridge with ball, piriformis stretch  heel lifts, morning stretches (from previous)     Consulted and Agree with Plan of Care  Patient       Patient will benefit from skilled therapeutic intervention in order to improve the following deficits and impairments:  Pain, Postural dysfunction, Decreased balance, Decreased mobility, Difficulty walking, Decreased range of motion, Decreased activity tolerance, Decreased strength, Abnormal gait, Improper body mechanics  Visit Diagnosis: Difficulty in walking, not elsewhere classified  Muscle weakness (generalized)  Chronic left-sided low back pain without sciatica     Problem List Patient Active Problem List   Diagnosis Date Noted  . Lower GI bleed 07/31/2012  . DM2 (diabetes mellitus, type 2) (HCC) 07/31/2012  . HTN (hypertension) 07/31/2012    Raymone Pembroke 09/09/2017, 1:26 PM  [facility] [address] [place], [place], [address] Phone: [phone]   Fax:  [phone]  Name: ANADALAY MACDONELL MRN: [id number] Date of Birth: [birth year]   Karie Mainland, PT 09/09/17 1:27 PM Phone: [phone] Fax: [phone]

## 2017-09-09 NOTE — Patient Instructions (Signed)
Reprinted last session

## 2017-09-22 ENCOUNTER — Encounter: Payer: Self-pay | Admitting: Physical Therapy

## 2017-09-22 ENCOUNTER — Ambulatory Visit: Payer: Medicare Other | Attending: Family Medicine | Admitting: Physical Therapy

## 2017-09-22 DIAGNOSIS — M5442 Lumbago with sciatica, left side: Secondary | ICD-10-CM | POA: Diagnosis present

## 2017-09-22 DIAGNOSIS — G8929 Other chronic pain: Secondary | ICD-10-CM | POA: Insufficient documentation

## 2017-09-22 DIAGNOSIS — M545 Low back pain, unspecified: Secondary | ICD-10-CM

## 2017-09-22 DIAGNOSIS — R262 Difficulty in walking, not elsewhere classified: Secondary | ICD-10-CM | POA: Diagnosis present

## 2017-09-22 DIAGNOSIS — R296 Repeated falls: Secondary | ICD-10-CM | POA: Insufficient documentation

## 2017-09-22 DIAGNOSIS — M6281 Muscle weakness (generalized): Secondary | ICD-10-CM | POA: Diagnosis present

## 2017-09-22 DIAGNOSIS — M25562 Pain in left knee: Secondary | ICD-10-CM | POA: Diagnosis present

## 2017-09-22 NOTE — Therapy (Signed)
[facility] [address] [place], [place], [address] Phone: [phone]   Fax:  [phone]  Physical Therapy Treatment  Patient Details  Name: [name] MRN: [id number] Date of Birth: [birth year] Referring Provider: Dr. Renaye Rakers    Encounter Date: 09/22/2017  PT End of Session - 09/22/17 1147    Visit Number  2    Number of Visits  6    Date for PT Re-Evaluation  10/21/17    PT Start Time  1105    PT Stop Time  1145    PT Time Calculation (min)  40 min    Activity Tolerance  Patient tolerated treatment well;No increased pain    Behavior During Therapy  WFL for tasks assessed/performed       Past Medical History:  Diagnosis Date  . Diabetes mellitus without complication (HCC)   . Diverticulitis   . Hypercholesterolemia   . Hypertension   . Shingles     Past Surgical History:  Procedure Laterality Date  . ABDOMINAL HYSTERECTOMY    . TOTAL HIP ARTHROPLASTY  2005   left sided    There were no vitals filed for this visit.  Subjective Assessment - 09/22/17 1113    Subjective  Knee a little sore moving a certain way.      Currently in Pain?  No/denies    Pain Location  Back    Pain Orientation  Left;Lower    Pain Descriptors / Indicators  Sharp    Pain Type  Chronic pain    Pain Radiating Towards  no    Pain Frequency  Intermittent    Aggravating Factors   going up down steps,  getting in pout of bed,  car.    Pain Relieving Factors  rest    Multiple Pain Sites  -- knee pain sometimes left  5/10 long standing                       [facility] Adult PT Treatment/Exercise - 09/22/17 0001      Lumbar Exercises: Stretches   Single Knee to Chest Stretch  3 reps;30 seconds cued hand palcement on thigh left vs shin.    Gastroc Stretch  3 reps;30 seconds incline board,  toes only for stretch      Lumbar Exercises: Aerobic   Nustep    L 5 6 minutes UE/LE      Lumbar Exercises: Standing   Heel  Raises  10 reps      Lumbar Exercises: Supine   Pelvic Tilt  -- 10 x,  cued to avoid leg use    Glut Set  10 reps    Bridge  10 reps and 5 X legs on ball      Knee/Hip Exercises: Seated   Other Seated Knee/Hip Exercises  quad sets 10 X both              PT Education - 09/22/17 1146    Education provided  Yes    Education Details  exercise cues    Person(s) Educated  Patient    Methods  Explanation;Verbal cues;Tactile cues;Demonstration       PT Short Term Goals - 07/28/17 1300      PT SHORT TERM GOAL #1   Title  Pt will be independent in her HEP    Baseline  able to do no problems with her husband    Time  3    Period  Weeks  Status  Achieved      PT SHORT TERM GOAL #2   Title  Pt will be able to amb on community surfaces with no LOB with no device for >/1000 feet.     Baseline  able    Time  3    Period  Weeks    Status  Achieved      PT SHORT TERM GOAL #3   Title  Pt will report less pain with sit to stand of </= 3/10.     Baseline  able to do,  did not complain of pain    Time  3    Period  Weeks    Status  On-going        PT Long Term Goals - 09/09/17 1246      PT LONG TERM GOAL #1   Title  Pt will be I with HEP for posture, lifting and balance     Time  6    Period  Weeks    Status  New    Target Date  10/21/17      PT LONG TERM GOAL #2   Title  Pt will improve her BERG balance score from 46/56 to >/= 50/56 to improve balance and decrease fall risk.     Time  6    Period  Weeks    Status  New    Target Date  10/21/17      PT LONG TERM GOAL #3   Title  Pt will be able to report pain </= 2/10 when getting out of bed each morning.     Time  6    Period  Weeks    Status  New      PT LONG TERM GOAL #4   Title  pt will safely demo lifting from the floor with no cues for light items     Time  6    Period  Weeks    Status  New    Target Date  10/21/17            Plan - 09/22/17 1150    Clinical Impression Statement  Patient  returns to PT today with no back PAIN.  eXERCISES REQUIRE MODERATE CUES.  sHE DECLINED THE NEED FOR NEW HANDOUTS .  I ASKED HER TO BRING THEM TO NEXT SESSION  so notes could be made as needed.  Hamstrings and ankles ROM tight.      PT Next Visit Plan  review HEP.  simplify.  Body mechanics , lifting Patient to bring handouts from earlier sessions.     PT Home Exercise Plan  QS, PPT, SKTC, supine marching, hamstring stretch seated, bridge with ball, piriformis stretch  heel lifts, morning stretches (from previous)     Consulted and Agree with Plan of Care  Patient       Patient will benefit from skilled therapeutic intervention in order to improve the following deficits and impairments:     Visit Diagnosis: Difficulty in walking, not elsewhere classified  Muscle weakness (generalized)  Chronic left-sided low back pain without sciatica  Acute midline low back pain with left-sided sciatica  Acute pain of left knee  Repeated falls     Problem List Patient Active Problem List   Diagnosis Date Noted  . Lower GI bleed 07/31/2012  . DM2 (diabetes mellitus, type 2) (HCC) 07/31/2012  . HTN (hypertension) 07/31/2012    HARRIS,KAREN PTA 09/22/2017, 11:53 AM  [facility] [address] [place], [place], [address] Phone: [phone]   Fax:  [phone]  Name: LAMETRIA KLUNK MRN: [id number] Date of Birth: 1929-01-08

## 2017-09-28 ENCOUNTER — Encounter: Payer: Self-pay | Admitting: Physical Therapy

## 2017-09-28 ENCOUNTER — Ambulatory Visit: Payer: Medicare Other | Admitting: Physical Therapy

## 2017-09-28 DIAGNOSIS — M545 Low back pain, unspecified: Secondary | ICD-10-CM

## 2017-09-28 DIAGNOSIS — G8929 Other chronic pain: Secondary | ICD-10-CM

## 2017-09-28 DIAGNOSIS — R262 Difficulty in walking, not elsewhere classified: Secondary | ICD-10-CM | POA: Diagnosis not present

## 2017-09-28 DIAGNOSIS — M6281 Muscle weakness (generalized): Secondary | ICD-10-CM

## 2017-09-28 NOTE — Therapy (Signed)
[facility] [address] [place], [place], [address] Phone: [phone]   Fax:  [phone]  Physical Therapy Treatment  Patient Details  Name: [name] MRN: [id number] Date of Birth: 1928/06/04 Referring Provider: Dr. Renaye RakersVeita Bland    Encounter Date: 09/28/2017  PT End of Session - 09/28/17 0950    Visit Number  3    Number of Visits  6    Date for PT Re-Evaluation  10/21/17    PT Start Time  0932    PT Stop Time  1015    PT Time Calculation (min)  43 min    Activity Tolerance  Patient tolerated treatment well;No increased pain    Behavior During Therapy  WFL for tasks assessed/performed       Past Medical History:  Diagnosis Date  . Diabetes mellitus without complication (HCC)   . Diverticulitis   . Hypercholesterolemia   . Hypertension   . Shingles     Past Surgical History:  Procedure Laterality Date  . ABDOMINAL HYSTERECTOMY    . TOTAL HIP ARTHROPLASTY  2005   left sided    There were no vitals filed for this visit.  Subjective Assessment - 09/28/17 0936    Subjective  Back and knee 4/10.  Arthritis in knee.  No new complaints.  Brings in HEP     Currently in Pain?  Yes    Pain Score  4     Pain Location  Back    Pain Orientation  Lower;Left    Pain Descriptors / Indicators  Sore;Aching    Pain Type  Chronic pain    Pain Radiating Towards  also has knee pain     Pain Onset  More than a month ago    Pain Frequency  Intermittent    Aggravating Factors   when I'm on it     Pain Relieving Factors  I dont know.  I take Tylenol.      Pain Score  4    Pain Location  Knee    Pain Orientation  Left    Pain Descriptors / Indicators  Aching    Pain Onset  More than a month ago    Pain Frequency  Intermittent    Aggravating Factors   weightbearing, change in weather    Pain Relieving Factors  REST, tylenol                        [facility] Adult PT Treatment/Exercise - 09/28/17 0001      Lumbar Exercises: Stretches   Active Hamstring Stretch  3 reps;30 seconds    Single Knee to Chest Stretch  3 reps;30 seconds    Lower Trunk Rotation  5 reps;30 seconds    Pelvic Tilt  10 reps      Lumbar Exercises: Aerobic   Nustep    L 5, 6 minutes UE/LE      Lumbar Exercises: Standing   Wall Slides  10 reps    Other Standing Lumbar Exercises  hip abduction x 10 slow bilateral       Lumbar Exercises: Seated   Long Arc Quad on Chair  Strengthening;Left;1 set    Sit to Stand  15 reps    Other Seated Lumbar Exercises  cues for technique and knee control              PT Education - 09/28/17 0949    Education provided  Yes  Education Details  reissued HEP more simplified, bigger font    Person(s) Educated  Patient    Methods  Explanation;Demonstration;Verbal cues;Handout    Comprehension  Returned demonstration;Verbalized understanding;Verbal cues required       PT Short Term Goals - 07/28/17 1300      PT SHORT TERM GOAL #1   Title  Pt will be independent in her HEP    Baseline  able to do no problems with her husband    Time  3    Period  Weeks    Status  Achieved      PT SHORT TERM GOAL #2   Title  Pt will be able to amb on community surfaces with no LOB with no device for >/1000 feet.     Baseline  able    Time  3    Period  Weeks    Status  Achieved      PT SHORT TERM GOAL #3   Title  Pt will report less pain with sit to stand of </= 3/10.     Baseline  able to do,  did not complain of pain    Time  3    Period  Weeks    Status  On-going        PT Long Term Goals - 09/28/17 1016      PT LONG TERM GOAL #1   Title  Pt will be I with HEP for posture, lifting and balance     Status  On-going      PT LONG TERM GOAL #2   Title  Pt will improve her BERG balance score from 46/56 to >/= 50/56 to improve balance and decrease fall risk.     Status  Unable to assess      PT LONG TERM GOAL #3   Title  Pt will be able to report pain </= 2/10 when getting  out of bed each morning.     Baseline  varies    Status  On-going      PT LONG TERM GOAL #4   Title  pt will safely demo lifting from the floor with no cues for light items     Status  Unable to assess            Plan - 09/28/17 1014    Clinical Impression Statement  Reissued HEP simplified as she needed cues.  Reports difficulty with sit to stand and yet only min discomfort noted.  Able to exercse without pain interfering.      PT Treatment/Interventions  ADLs/Self Care Home Management;Cryotherapy;Electrical Stimulation;Iontophoresis 4mg /ml Dexamethasone;Moist Heat;Therapeutic exercise;Therapeutic activities;Functional mobility training;Stair training;Gait training;Ultrasound;Balance training;Neuromuscular re-education;Patient/family education;Manual techniques;Passive range of motion    PT Next Visit Plan   Body mechanics , lifting , calf stretch    PT Home Exercise Plan  QS, PPT, SKTC, supine marching, hamstring stretch seated, bridge with ball, piriformis stretch  heel lifts, morning stretches (from previous)     Consulted and Agree with Plan of Care  Patient       Patient will benefit from skilled therapeutic intervention in order to improve the following deficits and impairments:  Pain, Postural dysfunction, Decreased balance, Decreased mobility, Difficulty walking, Decreased range of motion, Decreased activity tolerance, Decreased strength, Abnormal gait, Improper body mechanics  Visit Diagnosis: Difficulty in walking, not elsewhere classified  Muscle weakness (generalized)  Chronic left-sided low back pain without sciatica     Problem List Patient Active Problem List   Diagnosis Date Noted  .  Lower GI bleed 07/31/2012  . DM2 (diabetes mellitus, type 2) (HCC) 07/31/2012  . HTN (hypertension) 07/31/2012    PAA,JENNIFER 09/28/2017, 10:17 AM  [facility] [address] [place], [place], [address] Phone: [phone]    Fax:  [phone]  Name: [name] MRN: [id number] Date of Birth: [birth year]  Karie Mainland, PT 09/28/17 10:17 AM Phone: [phone] Fax: [phone]

## 2017-09-28 NOTE — Patient Instructions (Signed)
Access Code: [email or web address]  URL: [email or web address]  Date: 09/28/2017  Prepared by: Karie MainlandJennifer Paa   Exercises  Seated Hamstring Stretch - 3 reps - 2 sets - 30 hold - 1x daily - 7x weekly  Seated Long Arc Quad - 10 reps - 2 sets - 5 hold - 1x daily - 7x weekly  Supine Quad Set - 10 reps - 2 sets - 10 hold - 1x daily - 7x weekly  Supine Piriformis Stretch with Foot on Ground - 3 reps - 2 sets - 30 hold - 1x daily - 7x weekly  Supine Single Knee to Chest - 3 reps - 2 sets - 30 hold - 1x daily - 7x weekly  Supine Posterior Pelvic Tilt - 10 reps - 2 sets - 5 hold - 1x daily - 7x weekly  Supine Bridge - 10 reps - 2 sets - 5 hold - 1x daily - 7x weekly

## 2017-10-05 ENCOUNTER — Encounter: Payer: Self-pay | Admitting: Physical Therapy

## 2017-10-05 ENCOUNTER — Ambulatory Visit: Payer: Medicare Other | Admitting: Physical Therapy

## 2017-10-05 DIAGNOSIS — G8929 Other chronic pain: Secondary | ICD-10-CM

## 2017-10-05 DIAGNOSIS — R262 Difficulty in walking, not elsewhere classified: Secondary | ICD-10-CM | POA: Diagnosis not present

## 2017-10-05 DIAGNOSIS — M545 Low back pain, unspecified: Secondary | ICD-10-CM

## 2017-10-05 DIAGNOSIS — M5442 Lumbago with sciatica, left side: Secondary | ICD-10-CM

## 2017-10-05 DIAGNOSIS — M25562 Pain in left knee: Secondary | ICD-10-CM

## 2017-10-05 DIAGNOSIS — M6281 Muscle weakness (generalized): Secondary | ICD-10-CM

## 2017-10-05 DIAGNOSIS — R296 Repeated falls: Secondary | ICD-10-CM

## 2017-10-05 NOTE — Patient Instructions (Signed)

## 2017-10-05 NOTE — Therapy (Signed)
[facility] [address] [place], [place], [address] Phone: [phone]   Fax:  [phone]  Physical Therapy Treatment  Patient Details  Name: [name] MRN: [id number] Date of Birth: 1928/09/26 Referring Provider: Dr. Renaye RakersVeita Bland    Encounter Date: 10/05/2017  PT End of Session - 10/05/17 1121    Visit Number  4    Number of Visits  6    Date for PT Re-Evaluation  10/21/17    PT Start Time  0933    PT Stop Time  1016    PT Time Calculation (min)  43 min    Activity Tolerance  Patient tolerated treatment well    Behavior During Therapy  [facility] for tasks assessed/performed       Past Medical History:  Diagnosis Date  . Diabetes mellitus without complication (HCC)   . Diverticulitis   . Hypercholesterolemia   . Hypertension   . Shingles     Past Surgical History:  Procedure Laterality Date  . ABDOMINAL HYSTERECTOMY    . TOTAL HIP ARTHROPLASTY  2005   left sided    There were no vitals filed for this visit.  Subjective Assessment - 10/05/17 0943    Subjective  Back 6-5 /10.    Knee 7/10.      Currently in Pain?  Yes    Pain Score  5     Pain Location  Back    Pain Orientation  Left;Lower    Pain Descriptors / Indicators  Sore    Pain Type  Chronic pain    Pain Frequency  Intermittent    Aggravating Factors   standing and walking too long    Pain Relieving Factors  Tylenol    Pain Score  7    Pain Location  Knee    Pain Orientation  Left    Pain Descriptors / Indicators  Aching    Pain Type  Acute pain    Pain Frequency  Intermittent    Aggravating Factors   getting in out of bed and car.     Pain Relieving Factors  rest tylenol                       [facility] Adult PT Treatment/Exercise - 10/05/17 0001      Self-Care   Self-Care  ADL's;Lifting    ADL's  Bed transfers required max cues initially ,  she was able to do with practice  with cues.  car transfer and other ADL's practiced /   demo.  patient plans to get a long handel dustpan.     Lifting  education for lifting off mat and floor.  modifications needed due to knee pain.  patient sits on something if she needs something off the floor.(  shoes)  showed and practiced the golfer's lift to get into lower cabinetd,  resting elbow on knee for added stability and support.        Lumbar Exercises: Aerobic   Nustep  L5 8 minutes,  UE/LE             PT Education - 10/05/17 1024    Education provided  Yes    Education Details  ADL.  posture    Person(s) Educated  Patient    Methods  Explanation;Demonstration;Verbal cues;Tactile cues;Handout    Comprehension  Verbalized understanding;Returned demonstration       PT Short Term Goals - 10/05/17 1128      PT SHORT  TERM GOAL #1   Title  Pt will be independent in her HEP    Time  3    Period  Weeks    Status  Achieved      PT SHORT TERM GOAL #2   Title  Pt will be able to amb on community surfaces with no LOB with no device for >/1000 feet.     Baseline  able    Time  3    Period  Weeks    Status  Achieved      PT SHORT TERM GOAL #3   Title  Pt will report less pain with sit to stand of </= 3/10.     Time  3    Period  Weeks    Status  Unable to assess        PT Long Term Goals - 10/05/17 1133      PT LONG TERM GOAL #1   Title  Pt will be I with HEP for posture, lifting and balance     Baseline  cues needed today for lifting and posture.    Time  6    Period  Weeks    Status  On-going      PT LONG TERM GOAL #2   Title  Pt will improve her BERG balance score from 46/56 to >/= 50/56 to improve balance and decrease fall risk.     Time  6    Period  Weeks    Status  Unable to assess      PT LONG TERM GOAL #3   Title  Pt will be able to report pain </= 2/10 when getting out of bed each morning.     Baseline  4/10 today( knees)    Time  6    Period  Weeks    Status  On-going      PT LONG TERM GOAL #4   Title  pt will safely demo lifting from  the floor with no cues for light items     Baseline  needs to modify or sit down,  needs cues,  education focus on this today    Time  6    Period  Weeks    Status  On-going            Plan - 10/05/17 1125    Clinical Impression Statement  Progress toward LTG#4 for lifting.  She is able to do with modification and some pain.  Session spent mainly on ADL,  posture education and practice.  knee pain shoud decrease with new technique getting out /in bed and car.Husband does a lot of the ADL tasks at home.     PT Next Visit Plan  One more visit.  answer any ADL/ lifting questions.  Able to change thee way she does at home?  check goals and review any HEP questions.     PT Home Exercise Plan  QS, PPT, SKTC, supine marching, hamstring stretch seated, bridge with ball, piriformis stretch  heel lifts, morning stretches (from previous)     Consulted and Agree with Plan of Care  Patient       Patient will benefit from skilled therapeutic intervention in order to improve the following deficits and impairments:     Visit Diagnosis: Difficulty in walking, not elsewhere classified  Muscle weakness (generalized)  Chronic left-sided low back pain without sciatica  Acute midline low back pain with left-sided sciatica  Acute pain of left knee  Repeated falls  Problem List Patient Active Problem List   Diagnosis Date Noted  . Lower GI bleed 07/31/2012  . DM2 (diabetes mellitus, type 2) (HCC) 07/31/2012  . HTN (hypertension) 07/31/2012    Brayon Bielefeld  PTA 10/05/2017, 11:38 AM  [facility] [address] [place], [place], [address] Phone: [phone]   Fax:  [phone]  Name: [name] MRN: [id number] Date of Birth: [birth year]

## 2017-10-12 ENCOUNTER — Encounter: Payer: Self-pay | Admitting: Physical Therapy

## 2017-10-12 ENCOUNTER — Ambulatory Visit: Payer: Medicare Other | Attending: Family Medicine | Admitting: Physical Therapy

## 2017-10-12 DIAGNOSIS — R296 Repeated falls: Secondary | ICD-10-CM | POA: Diagnosis present

## 2017-10-12 DIAGNOSIS — M545 Low back pain, unspecified: Secondary | ICD-10-CM

## 2017-10-12 DIAGNOSIS — M6281 Muscle weakness (generalized): Secondary | ICD-10-CM

## 2017-10-12 DIAGNOSIS — G8929 Other chronic pain: Secondary | ICD-10-CM | POA: Insufficient documentation

## 2017-10-12 DIAGNOSIS — M25562 Pain in left knee: Secondary | ICD-10-CM | POA: Diagnosis present

## 2017-10-12 DIAGNOSIS — M5442 Lumbago with sciatica, left side: Secondary | ICD-10-CM | POA: Diagnosis present

## 2017-10-12 NOTE — Therapy (Addendum)
[place], [place], [address] Phone: [phone]   Fax:  [phone]  Physical Therapy Treatment/Discharge  Patient Details  Name: [name] MRN: [id number] Date of Birth: 07/09/1928 Referring Provider: Dr. Lucianne Lei    Encounter Date: 10/12/2017  PT End of Session - 10/12/17 1153    Visit Number  5    Number of Visits  6    Date for PT Re-Evaluation  10/21/17    PT Start Time  0928    PT Stop Time  1015    PT Time Calculation (min)  47 min    Activity Tolerance  Patient tolerated treatment well;No increased pain    Behavior During Therapy  WFL for tasks assessed/performed       Past Medical History:  Diagnosis Date  . Diabetes mellitus without complication ([place])   . Diverticulitis   . Hypercholesterolemia   . Hypertension   . Shingles     Past Surgical History:  Procedure Laterality Date  . ABDOMINAL HYSTERECTOMY    . TOTAL HIP ARTHROPLASTY  2005   left sided    There were no vitals filed for this visit.  Subjective Assessment - 10/12/17 0957    Subjective  No back pain.  Knee pain in am 5-6/10.   I am going to miss you all.    I have been getting out of car and bed the way you told me.  Back does not hurt when she stands and walks.  I can do the exercises    Currently in Pain?  No/denies    Pain Score  0-No pain    Pain Location  Back    Pain Orientation  Left;Lower    Pain Frequency  Intermittent    Pain Score  6    Pain Location  Knee    Pain Orientation  Left    Pain Descriptors / Indicators  Aching stiff    Pain Type  Acute pain    Pain Onset  Yesterday    Pain Frequency  Intermittent    Aggravating Factors   getting out of bed    Pain Relieving Factors  slowly moving at first the is OK         [facility] PT Assessment - 10/12/17 0001      Berg Balance Test   Sit to Stand  Able to stand without using hands and stabilize independently    Standing Unsupported  Able to  stand safely 2 minutes    Sitting with Back Unsupported but Feet Supported on Floor or Stool  Able to sit safely and securely 2 minutes    Stand to Sit  Sits safely with minimal use of hands    Transfers  Able to transfer safely, minor use of hands    Standing Unsupported with Eyes Closed  Able to stand 10 seconds safely    Standing Ubsupported with Feet Together  Able to place feet together independently and stand 1 minute safely    From Standing, Reach Forward with Outstretched Arm  Can reach confidently >25 cm (10")    From Standing Position, Pick up Object from Floor  Able to pick up shoe safely and easily    From Standing Position, Turn to Look Behind Over each Shoulder  Turn sideways only but maintains balance    Turn 360 Degrees  Able to turn 360 degrees safely one side only in 4 seconds or less    Standing Unsupported, Alternately  Place Feet on Step/Stool  Able to stand independently and safely and complete 8 steps in 20 seconds    Standing Unsupported, One Foot in Front  Able to plae foot ahead of the other independently and hold 30 seconds    Standing on One Leg  Tries to lift leg/unable to hold 3 seconds but remains standing independently    Total Score  49                   [facility] Adult PT Treatment/Exercise - 10/12/17 0001      Lumbar Exercises: Stretches   Active Hamstring Stretch  3 reps;30 seconds sitting,     Piriformis Stretch  3 reps;20 seconds    Other Lumbar Stretch Exercise  Good morning stretch      Lumbar Exercises: Aerobic   Nustep  L5       Lumbar Exercises: Standing   Heel Raises  10 reps      Lumbar Exercises: Supine   Bent Knee Raise  10 reps with abdominals,  minor cues    Bridge with [employer]  10 reps minor cues,  notes she does not squeeze ball/pillow at home.      Knee/Hip Exercises: Supine   Quad Sets  10 reps             PT Education - 10/12/17 1152    Education provided  Yes    Education Details  HEP,  BERG score results  per Cheryln Manly PT-  may need a cane in community/ crowds and unlevel areas    Methods  Explanation    Comprehension  Verbalized understanding;Returned demonstration       PT Short Term Goals - 10/12/17 1154      PT SHORT TERM GOAL #1   Baseline  able to do no problems with her husband    Time  3    Period  Weeks    Status  Achieved      PT SHORT TERM GOAL #2   Title  Pt will be able to amb on community surfaces with no LOB with no device for >/1000 feet.     Baseline  able    Time  3    Period  Weeks    Status  Achieved      PT SHORT TERM GOAL #3   Title  Pt will report less pain with sit to stand of </= 3/10.     Baseline  no pain with this    Time  3    Period  Weeks    Status  Achieved        PT Long Term Goals - 10/12/17 1155      PT LONG TERM GOAL #1   Title  Pt will be I with HEP for posture, lifting and balance     Baseline  Patient is  modified independent with HEP,  she is able to lift correctly object from floor.     Time  6    Period  Weeks    Status  Achieved      PT LONG TERM GOAL #2   Title  Pt will improve her BERG balance score from 46/56 to >/= 50/56 to improve balance and decrease fall risk.     Baseline  49/56    Time  6    Period  Weeks    Status  Partially Met      PT LONG TERM GOAL #3   Title  Pt will be able to report pain </= 2/10 when getting out of bed each morning.     Baseline  no back pain getting in out of bed,  knee pain 5-6/10 each morning.    Time  6    Status  Partially Met      PT LONG TERM GOAL #4   Title  pt will safely demo lifting from the floor with no cues for light items     Baseline  able to demo correctly in clinic without cues    Time  6    Period  Weeks    Status  Achieved            Plan - 10/12/17 1158    Clinical Impression Statement  Last visit today per patient.  She feels she is ready for discharge.  All HEP practiced in clinic with minor cues given as needed. LTG'S #1, #4 met.  LTG's  # 2, #3  partially met.  BERG score improved to 49/56.  PT Jenn PAA suggested that patient may need a cane in community.   Patient not using a cane, saying she avoids crowds.  Patient has the support of a helpful husband at home to help with the day to day ADL tasks and HEP.    PT Next Visit Plan  Discharge to HEP    PT Home Exercise Plan  QS, PPT, SKTC, supine marching, hamstring stretch seated, bridge with ball, piriformis stretch  heel lifts, morning stretches (from previous)     Consulted and Agree with Plan of Care  Patient       Patient will benefit from skilled therapeutic intervention in order to improve the following deficits and impairments:     Visit Diagnosis: Muscle weakness (generalized)  Chronic left-sided low back pain without sciatica  Acute midline low back pain with left-sided sciatica  Acute pain of left knee  Repeated falls     Problem List Patient Active Problem List   Diagnosis Date Noted  . Lower GI bleed 07/31/2012  . DM2 (diabetes mellitus, type 2) ([place]) 07/31/2012  . HTN (hypertension) 07/31/2012    Jaqualyn Juday PTA 10/12/2017, 12:05 PM  [facility] [address] [place], [place], [address] Phone: [phone]   Fax:  [phone]  Name: LACHRISTA HESLIN MRN: [id number] Date of Birth: 06-02-1928  PHYSICAL THERAPY DISCHARGE SUMMARY  Visits from Start of Care: 5  Current functional level related to goals / functional outcomes: See above    Remaining deficits: None limiting function   Education / Equipment: HEP, posture, body mechanics  Plan: Patient agrees to discharge.  Patient goals were met. Patient is being discharged due to meeting the stated rehab goals.  ?????     Raeford Razor, PT 10/15/17 10:17 AM Phone: [phone] Fax: [phone]

## 2017-10-12 NOTE — Patient Instructions (Signed)
Continue with the HEP Continue using good body mechanics

## 2017-12-17 ENCOUNTER — Other Ambulatory Visit: Payer: Self-pay

## 2017-12-17 ENCOUNTER — Observation Stay (HOSPITAL_COMMUNITY)
Admission: EM | Admit: 2017-12-17 | Discharge: 2017-12-18 | Disposition: A | Discharge status: Home / Self Care | Payer: Medicare Other | Attending: Internal Medicine | Admitting: Internal Medicine

## 2017-12-17 ENCOUNTER — Encounter (HOSPITAL_COMMUNITY): Payer: Self-pay | Admitting: Emergency Medicine

## 2017-12-17 DIAGNOSIS — E119 Type 2 diabetes mellitus without complications: Secondary | ICD-10-CM | POA: Diagnosis not present

## 2017-12-17 DIAGNOSIS — K922 Gastrointestinal hemorrhage, unspecified: Secondary | ICD-10-CM | POA: Diagnosis not present

## 2017-12-17 DIAGNOSIS — R011 Cardiac murmur, unspecified: Secondary | ICD-10-CM | POA: Diagnosis not present

## 2017-12-17 DIAGNOSIS — E785 Hyperlipidemia, unspecified: Secondary | ICD-10-CM | POA: Diagnosis not present

## 2017-12-17 DIAGNOSIS — E7800 Pure hypercholesterolemia, unspecified: Secondary | ICD-10-CM | POA: Diagnosis not present

## 2017-12-17 DIAGNOSIS — I10 Essential (primary) hypertension: Secondary | ICD-10-CM | POA: Diagnosis not present

## 2017-12-17 DIAGNOSIS — R55 Syncope and collapse: Secondary | ICD-10-CM | POA: Insufficient documentation

## 2017-12-17 DIAGNOSIS — Z86010 Personal history of colonic polyps: Secondary | ICD-10-CM | POA: Insufficient documentation

## 2017-12-17 DIAGNOSIS — A0472 Enterocolitis due to Clostridium difficile, not specified as recurrent: Secondary | ICD-10-CM | POA: Diagnosis not present

## 2017-12-17 DIAGNOSIS — Z8719 Personal history of other diseases of the digestive system: Secondary | ICD-10-CM | POA: Diagnosis not present

## 2017-12-17 DIAGNOSIS — K625 Hemorrhage of anus and rectum: Principal | ICD-10-CM | POA: Insufficient documentation

## 2017-12-17 DIAGNOSIS — Z79899 Other long term (current) drug therapy: Secondary | ICD-10-CM | POA: Diagnosis not present

## 2017-12-17 DIAGNOSIS — I472 Ventricular tachycardia: Secondary | ICD-10-CM | POA: Diagnosis not present

## 2017-12-17 DIAGNOSIS — Z96642 Presence of left artificial hip joint: Secondary | ICD-10-CM | POA: Diagnosis not present

## 2017-12-17 DIAGNOSIS — K5731 Diverticulosis of large intestine without perforation or abscess with bleeding: Secondary | ICD-10-CM | POA: Diagnosis present

## 2017-12-17 DIAGNOSIS — D649 Anemia, unspecified: Secondary | ICD-10-CM | POA: Insufficient documentation

## 2017-12-17 LAB — CBC
HCT: 36.9 % (ref 36.0–46.0)
HCT: 33.1 % — ABNORMAL LOW (ref 36.0–46.0)
Hemoglobin: 12.1 g/dL (ref 12.0–15.0)
Hemoglobin: 10.8 g/dL — ABNORMAL LOW (ref 12.0–15.0)
MCH: 32.4 pg (ref 26.0–34.0)
MCH: 32.3 pg (ref 26.0–34.0)
MCHC: 32.8 g/dL (ref 30.0–36.0)
MCHC: 32.6 g/dL (ref 30.0–36.0)
MCV: 98.9 fL (ref 78.0–100.0)
MCV: 99.1 fL (ref 78.0–100.0)
Platelets: 146 10*3/uL — ABNORMAL LOW (ref 150–400)
Platelets: 120 10*3/uL — ABNORMAL LOW (ref 150–400)
RBC: 3.73 MIL/uL — ABNORMAL LOW (ref 3.87–5.11)
RBC: 3.34 MIL/uL — ABNORMAL LOW (ref 3.87–5.11)
RDW: 12.3 % (ref 11.5–15.5)
RDW: 12.4 % (ref 11.5–15.5)
WBC: 7.7 10*3/uL (ref 4.0–10.5)
WBC: 8.5 10*3/uL (ref 4.0–10.5)

## 2017-12-17 LAB — COMPREHENSIVE METABOLIC PANEL
ALT: 17 U/L (ref 0–44)
ALT: 16 U/L (ref 0–44)
AST: 19 U/L (ref 15–41)
AST: 17 U/L (ref 15–41)
Albumin: 3.7 g/dL (ref 3.5–5.0)
Albumin: 3.2 g/dL — ABNORMAL LOW (ref 3.5–5.0)
Alkaline Phosphatase: 71 U/L (ref 38–126)
Alkaline Phosphatase: 61 U/L (ref 38–126)
Anion gap: 11 (ref 5–15)
Anion gap: 8 (ref 5–15)
BUN: 17 mg/dL (ref 8–23)
BUN: 14 mg/dL (ref 8–23)
CO2: 22 mmol/L (ref 22–32)
CO2: 20 mmol/L — ABNORMAL LOW (ref 22–32)
Calcium: 9.1 mg/dL (ref 8.9–10.3)
Calcium: 8.2 mg/dL — ABNORMAL LOW (ref 8.9–10.3)
Chloride: 108 mmol/L (ref 98–111)
Chloride: 113 mmol/L — ABNORMAL HIGH (ref 98–111)
Creatinine, Ser: 0.83 mg/dL (ref 0.44–1.00)
Creatinine, Ser: 0.68 mg/dL (ref 0.44–1.00)
GFR calc Af Amer: >60 mL/min (ref >60)
GFR calc Af Amer: >60 mL/min (ref >60)
GFR calc non Af Amer: >60 mL/min (ref >60)
GFR calc non Af Amer: >60 mL/min (ref >60)
Glucose, Bld: 170 mg/dL — ABNORMAL HIGH (ref 70–99)
Glucose, Bld: 150 mg/dL — ABNORMAL HIGH (ref 70–99)
Potassium: 4.7 mmol/L (ref 3.5–5.1)
Potassium: 4.2 mmol/L (ref 3.5–5.1)
Sodium: 141 mmol/L (ref 135–145)
Sodium: 141 mmol/L (ref 135–145)
Total Bilirubin: 0.9 mg/dL (ref 0.3–1.2)
Total Bilirubin: 1 mg/dL (ref 0.3–1.2)
Total Protein: 6 g/dL — ABNORMAL LOW (ref 6.5–8.1)
Total Protein: 5.4 g/dL — ABNORMAL LOW (ref 6.5–8.1)

## 2017-12-17 LAB — TYPE AND SCREEN
ABO/RH(D): O NEG
Antibody Screen: NEGATIVE

## 2017-12-17 LAB — PROTIME-INR
INR: 1.04
Prothrombin Time: 13.5 seconds (ref 11.4–15.2)

## 2017-12-17 LAB — GLUCOSE, CAPILLARY
Glucose-Capillary: 137 mg/dL — ABNORMAL HIGH (ref 70–99)
Glucose-Capillary: 121 mg/dL — ABNORMAL HIGH (ref 70–99)
Glucose-Capillary: 214 mg/dL — ABNORMAL HIGH (ref 70–99)
Glucose-Capillary: 148 mg/dL — ABNORMAL HIGH (ref 70–99)

## 2017-12-17 MED ORDER — METOPROLOL TARTRATE 12.5 MG HALF TABLET
12.5000 mg | ORAL_TABLET | Freq: Two times a day (BID) | ORAL | Status: DC
  Administered 2017-12-18: 12.5 mg via ORAL
  Filled 2017-12-17 (×2): qty 1

## 2017-12-17 MED ORDER — ONDANSETRON HCL 4 MG/2ML IJ SOLN: 4.0000 mg | Freq: Four times a day (QID) | INTRAMUSCULAR | Status: DC | PRN

## 2017-12-17 MED ORDER — ACETAMINOPHEN 650 MG RE SUPP: 650.0000 mg | Freq: Four times a day (QID) | RECTAL | Status: DC | PRN

## 2017-12-17 MED ORDER — BRIMONIDINE TARTRATE 0.2 % OP SOLN
1.0000 [drp] | Freq: Two times a day (BID) | OPHTHALMIC | Status: DC
  Administered 2017-12-17 – 2017-12-18 (×2): 1 [drp] via OPHTHALMIC
  Filled 2017-12-17: qty 5

## 2017-12-17 MED ORDER — INSULIN ASPART 100 UNIT/ML ~~LOC~~ SOLN
0.0000 [IU] | Freq: Three times a day (TID) | SUBCUTANEOUS | Status: DC
  Administered 2017-12-17: 3 [IU] via SUBCUTANEOUS
  Administered 2017-12-18: 1 [IU] via SUBCUTANEOUS

## 2017-12-17 MED ORDER — ONDANSETRON HCL 4 MG/2ML IJ SOLN
4.0000 mg | Freq: Once | INTRAMUSCULAR | Status: AC
  Administered 2017-12-17: 4 mg via INTRAVENOUS
  Filled 2017-12-17: qty 2

## 2017-12-17 MED ORDER — LATANOPROST 0.005 % OP SOLN
1.0000 [drp] | Freq: Every day | OPHTHALMIC | Status: DC
  Administered 2017-12-17: 1 [drp] via OPHTHALMIC
  Filled 2017-12-17: qty 2.5

## 2017-12-17 MED ORDER — SODIUM CHLORIDE 0.9 % IV BOLUS
1000.0000 mL | Freq: Once | INTRAVENOUS | Status: AC
  Administered 2017-12-17: 1000 mL via INTRAVENOUS

## 2017-12-17 MED ORDER — DORZOLAMIDE HCL 2 % OP SOLN
1.0000 [drp] | Freq: Three times a day (TID) | OPHTHALMIC | Status: DC
  Administered 2017-12-17 – 2017-12-18 (×3): 1 [drp] via OPHTHALMIC
  Filled 2017-12-17: qty 10

## 2017-12-17 MED ORDER — AMLODIPINE BESYLATE 10 MG PO TABS
10.0000 mg | ORAL_TABLET | Freq: Every day | ORAL | Status: DC
  Administered 2017-12-17: 10 mg via ORAL
  Filled 2017-12-17: qty 1

## 2017-12-17 MED ORDER — BRIMONIDINE TARTRATE-TIMOLOL 0.2-0.5 % OP SOLN: 1.0000 [drp] | Freq: Two times a day (BID) | OPHTHALMIC | Status: DC

## 2017-12-17 MED ORDER — LORAZEPAM 2 MG/ML IJ SOLN
INTRAMUSCULAR | Status: AC
  Filled 2017-12-17: qty 1

## 2017-12-17 MED ORDER — TIMOLOL MALEATE 0.5 % OP SOLN
1.0000 [drp] | Freq: Two times a day (BID) | OPHTHALMIC | Status: DC
  Administered 2017-12-17 – 2017-12-18 (×2): 1 [drp] via OPHTHALMIC
  Filled 2017-12-17: qty 5

## 2017-12-17 MED ORDER — SODIUM CHLORIDE 0.9 % IV SOLN: Freq: Once | INTRAVENOUS | Status: DC

## 2017-12-17 MED ORDER — SODIUM CHLORIDE 0.9 % IV SOLN
INTRAVENOUS | Status: DC
  Administered 2017-12-17: 06:00:00 via INTRAVENOUS

## 2017-12-17 MED ORDER — ACETAMINOPHEN 325 MG PO TABS: 650.0000 mg | ORAL_TABLET | Freq: Four times a day (QID) | ORAL | Status: DC | PRN

## 2017-12-17 MED ORDER — INSULIN ASPART 100 UNIT/ML ~~LOC~~ SOLN: 0.0000 [IU] | SUBCUTANEOUS | Status: DC

## 2017-12-17 MED ORDER — LINAGLIPTIN 5 MG PO TABS
5.0000 mg | ORAL_TABLET | Freq: Every day | ORAL | Status: DC
  Administered 2017-12-17 – 2017-12-18 (×2): 5 mg via ORAL
  Filled 2017-12-17 (×2): qty 1

## 2017-12-17 MED ORDER — BRINZOLAMIDE 1 % OP SUSP
1.0000 [drp] | Freq: Every day | OPHTHALMIC | Status: DC
  Filled 2017-12-17: qty 10

## 2017-12-17 MED ORDER — SIMVASTATIN 20 MG PO TABS
20.0000 mg | ORAL_TABLET | Freq: Every day | ORAL | Status: DC
  Administered 2017-12-17 – 2017-12-18 (×2): 20 mg via ORAL
  Filled 2017-12-17 (×2): qty 1

## 2017-12-17 MED ORDER — BRIMONIDINE TARTRATE 0.2 % OP SOLN
1.0000 [drp] | Freq: Every day | OPHTHALMIC | Status: DC
  Filled 2017-12-17: qty 5

## 2017-12-17 NOTE — Progress Notes (Signed)
Late entry. Received call from central telemetry this morning of 9 beats VT. Patient asymptomatic. VSS. MD notified. Will continue to monitor. Bess Kinds, RN

## 2017-12-17 NOTE — ED Provider Notes (Signed)
[facility] EMERGENCY DEPARTMENT Provider Note  CSN: [id number] Arrival date & time: 12/17/17 0302  Chief Complaint(s) Rectal Bleeding  HPI [name] is a 84 y.o. female with a history of hypertension, hyperlipidemia, diabetes, diverticulosis with prior GI bleeds who presents to the emergency department with 2 episodes of hematochezia that began several hours prior to arrival. No alleviating or aggravating factors.  Patient denies any associated abdominal pain.  She denies any chest pain or shortness of breath.  Denies any recent fevers or infections.  No nausea or vomiting.  Patient was seen for the same 10 years and 5 years ago.  She has had follow-up with [facility] and reports Dx of diverticulosis.  She denies any anticoagulation.  HPI  Past Medical History Past Medical History:  Diagnosis Date  . Diabetes mellitus without complication (HCC)   . Diverticulitis   . Hypercholesterolemia   . Hypertension   . Shingles    Patient Active Problem List   Diagnosis Date Noted  . Lower GI bleed 07/31/2012  . DM2 (diabetes mellitus, type 2) (HCC) 07/31/2012  . HTN (hypertension) 07/31/2012   Home Medication(s) Prior to Admission medications   Medication Sig Start Date End Date Taking? Authorizing Provider  amLODipine (NORVASC) 10 MG tablet Take 10 mg by mouth daily.   Yes [provider]  brimonidine-timolol (COMBIGAN) 0.2-0.5 % ophthalmic solution Place 1 drop into both eyes every 12 (twelve) hours.   Yes [provider]  Brinzolamide-Brimonidine (SIMBRINZA) 1-0.2 % SUSP Place 1 drop into both eyes at bedtime.    Yes [provider]  diclofenac sodium (VOLTAREN) 1 % GEL Apply 4 g topically 4 (four) times daily.  12/07/17  Yes [provider]  dorzolamide (TRUSOPT) 2 % ophthalmic solution Place 1 drop into both eyes 3 (three) times daily.  12/02/17  Yes [provider]  LUMIGAN 0.01 % SOLN Place 1 drop into both eyes at  bedtime. 11/10/17  Yes [provider]  meloxicam (MOBIC) 15 MG tablet Take 15 mg by mouth daily. 12/07/17  Yes [provider]  saxagliptin HCl (ONGLYZA) 5 MG TABS tablet Take 5 mg by mouth daily.   Yes [provider]  simvastatin (ZOCOR) 20 MG tablet Take 20 mg by mouth daily.    Yes [provider]  vitamin C (ASCORBIC ACID) 500 MG tablet Take 500 mg by mouth daily.   Yes [provider]  ferrous sulfate 325 (65 FE) MG tablet Take 1 tablet (325 mg total) by mouth 3 (three) times daily with meals. Patient not taking: Reported on 12/17/2017 08/01/12   Marinda Elk, MD  mometasone (ELOCON) 0.1 % cream Apply 1 application topically daily. Patient not taking: Reported on 12/17/2017 07/14/13   Linna Hoff, MD  Past Surgical History Past Surgical History:  Procedure Laterality Date  . ABDOMINAL HYSTERECTOMY    . TOTAL HIP ARTHROPLASTY  2005   left sided   Family History No family history on file.  Social History Social History   Tobacco Use  . Smoking status: Never Smoker  . Smokeless tobacco: Never Used  Substance Use Topics  . Alcohol use: Yes  . Drug use: No   Allergies Penicillins  Review of Systems Review of Systems All other systems are reviewed and are negative for acute change except as noted in the HPI  Physical Exam Vital Signs  I have reviewed the triage vital signs BP 138/64 (BP Location: Left Arm)   Pulse 80   Temp 98.4 F (36.9 C) (Oral)   Resp 16   Ht 5' 1.5" (1.562 m)   Wt 62.6 kg   SpO2 100%   BMI 25.65 kg/m   Physical Exam  Constitutional: She is oriented to person, place, and time. She appears well-developed and well-nourished. No distress.  HENT:  Head: Normocephalic and atraumatic.  Right Ear: External ear normal.  Left Ear: External ear normal.  Nose: Nose normal.    Eyes: Conjunctivae and EOM are normal. No scleral icterus.  Neck: Normal range of motion and phonation normal.  Cardiovascular: Regular rhythm. Bradycardia present.  Pulmonary/Chest: Effort normal. No stridor. No respiratory distress.  Abdominal: She exhibits no distension. There is no tenderness. There is no rigidity, no rebound and no guarding.  Genitourinary:  Genitourinary Comments: Dark maroon BM during my evaluation.  Musculoskeletal: Normal range of motion. She exhibits no edema.  Neurological: She is alert and oriented to person, place, and time.  Skin: She is not diaphoretic.  Psychiatric: She has a normal mood and affect. Her behavior is normal.  Vitals reviewed.   ED Results and Treatments Labs (all labs ordered are listed, but only abnormal results are displayed) Labs Reviewed  COMPREHENSIVE METABOLIC PANEL - Abnormal; Notable for the following components:      Result Value   Glucose, Bld 170 (*)    Total Protein 6.0 (*)    All other components within normal limits  CBC - Abnormal; Notable for the following components:   RBC 3.73 (*)    Platelets 146 (*)    All other components within normal limits  PROTIME-INR  TYPE AND SCREEN                                                                                                                         EKG: not in MUSE  EKG Interpretation  Date/Time: 12/17/2017  04:12:42   Ventricular Rate:   65 PR Interval:   190 QRS Duration:  103 QT Interval:   421 QTC Calculation:  438 R Axis:     Text Interpretation: Sinus rhythm.  PVC.  Otherwise no significant changes when compared to prior EKGs.      Radiology No results found. Pertinent labs & imaging results that were available  during my care of the patient were reviewed by me and considered in my medical decision making (see chart for details).  Medications Ordered in ED Medications  sodium chloride 0.9 % bolus 1,000 mL (0 mLs Intravenous Stopped 12/17/17 0451)   sodium chloride 0.9 % bolus 1,000 mL (has no administration in time range)  0.9 %  sodium chloride infusion (has no administration in time range)  ondansetron (ZOFRAN) injection 4 mg (4 mg Intravenous Given 12/17/17 0411)                                                                                                                                    Procedures Procedures  (including critical care time)  Medical Decision Making / ED Course I have reviewed the nursing notes for this encounter and the patient's prior records (if available in EHR or on provided paperwork).  Clinical Course as of Dec 17 444  Fri Dec 17, 2017  0947 Jari Favre BM here in the ED. Pt has syncopal episode during the BM.  Recover spontaneously.  Repeat BPs reassuring.  Screening labs obtained.  IV fluids initiated.   [PC]  0357 Hemoglobin reassuring at 12.    [PC]  0440 Rest of the work-up reassuring without significant electrolyte derangements or renal insufficiency.  Patient remained hemodynamically stable without another bowel movement.  Will consult medicine for admission with GI consult in the morning.   [PC]  [phone] Spoke with Dr. Selena Batten who will admit   [PC]    Clinical Course User Index [PC] Neithan Day, Amadeo Garnet, MD     Final Clinical Impression(s) / ED Diagnoses Final diagnoses:  Acute GI bleeding      This chart was dictated using voice recognition software.  Despite best efforts to proofread,  errors can occur which can change the documentation meaning.   Nira Conn, MD 12/17/17 [phone]

## 2017-12-17 NOTE — ED Triage Notes (Signed)
Pt states she woke up this AM to use bathroom, pt had BM with bright red blood in her stool. Hx GI Bleed 2014. Denies thinners.

## 2017-12-17 NOTE — Progress Notes (Signed)
PROGRESS NOTE  [name]:[id number] DOB: 1928/09/08 DOA: 12/17/2017 PCP: Renaye Rakers, MD  HPI/Recap of past 24 hours:   Assessment/Plan: Active Problems:   Lower GI bleed   DM2 (diabetes mellitus, type 2) (HCC)   HTN (hypertension)   Lower GI bleeding   BRBPR x3 , last episode in ED Denies pain With h/o diverticula bleed Hemodynamically stable, monitor hgb GI consulted, case discussed with gi Dr Elnoria Howard who graciously agreed to see patient in consult  Per ED RN, patient has passed out, with bradycardia down in to the 30's while having profuse rectal bleeding with clots She reports felt nauses and sweaty during the episode. Patient denies LOC. She likely has a vasovagal episode while having bloody bm.  NSVT 9beats on tele, she denies symptom Will check mag/tsh Keep on tele Start low dose lopressor with holding parametes  Soft heart murmur at right upper sternal border: She denies chest pain, no sob She reports baseline very active, walk daily 1- , no symptom  HTN -d/c novasc, change to low dose lopressor due to NSVT on tele  noninsulin dependent dm2 Will check a1c Hold home oral meds, on ssi here  HLD Continue statin     Code Status: full  Family Communication: patient and family  Disposition Plan: home once no more bleeding ,HGB stable and cleared by GI   Consultants:  GI Dr Elnoria Howard  Procedures:  none  Antibiotics:  none   Objective: BP 135/61 (BP Location: Left Arm)   Pulse 68   Temp 98.6 F (37 C) (Oral)   Resp 18   Ht 5' 1.5" (1.562 m)   Wt 62.8 kg   SpO2 100%   BMI 25.74 kg/m   Intake/Output Summary (Last 24 hours) at 12/17/2017 1353 Last data filed at 12/17/2017 1235 Gross per 24 hour  Intake 2625.86 ml  Output 702 ml  Net 1923.86 ml   Filed Weights   12/17/17 0310 12/17/17 0604  Weight: 62.6 kg 62.8 kg    Exam: Patient is examined daily including today on 12/17/2017, exams remain the same as of yesterday except  that has changed    General:  NAD  Cardiovascular: RRR, 1-2/6 systolic murmur at right upper sternal border  Respiratory: CTABL  Abdomen: Soft/ND/NT, positive BS  Musculoskeletal: No Edema  Neuro: alert, oriented   Data Reviewed: Basic Metabolic Panel: Recent Labs  Lab 12/17/17 0328 12/17/17 0721  NA 141 141  K 4.7 4.2  CL 108 113*  CO2 22 20*  GLUCOSE 170* 150*  BUN 17 14  CREATININE 0.83 0.68  CALCIUM 9.1 8.2*   Liver Function Tests: Recent Labs  Lab 12/17/17 0328 12/17/17 0721  AST 19 17  ALT 17 16  ALKPHOS 71 61  BILITOT 0.9 1.0  PROT 6.0* 5.4*  ALBUMIN 3.7 3.2*   No results for input(s): LIPASE, AMYLASE in the last 168 hours. No results for input(s): AMMONIA in the last 168 hours. CBC: Recent Labs  Lab 12/17/17 0328 12/17/17 0721  WBC 7.7 8.5  HGB 12.1 10.8*  HCT 36.9 33.1*  MCV 98.9 99.1  PLT 146* 120*   Cardiac Enzymes:   No results for input(s): CKTOTAL, CKMB, CKMBINDEX, TROPONINI in the last 168 hours. BNP (last 3 results) No results for input(s): BNP in the last 8760 hours.  ProBNP (last 3 results) No results for input(s): PROBNP in the last 8760 hours.  CBG: Recent Labs  Lab 12/17/17 0603 12/17/17 1054  GLUCAP 137* 121*  No results found for this or any previous visit (from the past 240 hour(s)).   Studies: No results found.  Scheduled Meds: . amLODipine  10 mg Oral Daily  . brimonidine  1 drop Both Eyes Q12H   And  . timolol  1 drop Both Eyes Q12H  . brinzolamide  1 drop Both Eyes QHS  . dorzolamide  1 drop Both Eyes TID  . insulin aspart  0-9 Units Subcutaneous Q4H  . latanoprost  1 drop Both Eyes QHS  . linagliptin  5 mg Oral Daily  . simvastatin  20 mg Oral Daily    Continuous Infusions: . sodium chloride 75 mL/hr at 12/17/17 1761     Time spent: 35 mins I have personally reviewed and interpreted on  12/17/2017 daily labs, tele strips, imagings as discussed above under date review session and assessment and  plans.  I reviewed all nursing notes, pharmacy notes, consultant notes,  vitals, pertinent old records  I have discussed plan of care as described above with RN , patient and family on 12/17/2017   Albertine Grates MD, PhD  [facility] Pager [phone]. If 7PM-7AM, please contact night-coverage at [email or web address], password [facility] 12/17/2017, 1:53 PM  LOS: 0 days

## 2017-12-17 NOTE — H&P (Signed)
TRH H&P   Patient Demographics:    [name], is a 84 y.o. female  MRN: [id number]   DOB - Jan 28, 1929  Admit Date - 12/17/2017  Outpatient Primary MD for the patient is Renaye Rakers, MD  Referring MD/NP/PA:  Drema Pry  Outpatient Specialists:  [facility]  Patient coming from: home  Chief Complaint  Patient presents with  . Rectal Bleeding      HPI:    [name]  is a 84 y.o. female, w hypertension, dm2, diverticulitis presents with c/o brbpr x3.  2 at home this evening and then 1x in ED.  Pt notes loose stool.  N/v x1 (no bloody emesis)   Pt denies fever, chills, constipation, black stool.  Pt denies aspirin use or blood thinner.    In ED,  T 98.4  P 68  Bp 139/56 pox 100% RA  Wbc 7.7, hgb 12.1, Plt 146 Na 141, k 4.7, Bun 17, Creatinine 0.83 Ast 19, Alt 17  Type and screen  Pt will be admitted for lower gi bleeding likely diverticular       Review of systems:    In addition to the HPI above, No Fever-chills, No Headache, No changes with Vision or hearing, No problems swallowing food or Liquids, No Chest pain, Cough or Shortness of Breath, No Abdominal pain, No Nausea or Vommitting, Bowel movements are regular, No Blood in stool or Urine, No dysuria, No new skin rashes or bruises, No new joints pains-aches,  No new weakness, tingling, numbness in any extremity, No recent weight gain or loss, No polyuria, polydypsia or polyphagia, No significant Mental Stressors.  A full 10 point Review of Systems was done, except as stated above, all other Review of Systems were negative.   With Past History of the following :    Past Medical History:  Diagnosis Date  . Diabetes mellitus without complication (HCC)   . Diverticulitis   . Hypercholesterolemia   . Hypertension   . Shingles       Past Surgical History:  Procedure Laterality Date  .  ABDOMINAL HYSTERECTOMY    . COLONOSCOPY    . TOTAL HIP ARTHROPLASTY  2005   left sided      Social History:     Social History   Tobacco Use  . Smoking status: Never Smoker  . Smokeless tobacco: Never Used  Substance Use Topics  . Alcohol use: Yes     Lives - at home  Mobility - walks by self   Family History :     Family History  Problem Relation Age of Onset  . CAD Father        Home Medications:   Prior to Admission medications   Medication Sig Start Date End Date Taking? Authorizing Provider  amLODipine (NORVASC) 10 MG tablet Take 10 mg by mouth daily.   Yes [provider]  brimonidine-timolol (COMBIGAN) 0.2-0.5 % ophthalmic solution Place 1 drop into both eyes every 12 (twelve) hours.   Yes [provider]  Brinzolamide-Brimonidine (SIMBRINZA) 1-0.2 % SUSP Place 1 drop into both eyes at bedtime.    Yes [provider]  diclofenac sodium (VOLTAREN) 1 % GEL Apply 4 g topically 4 (four) times daily.  12/07/17  Yes [provider]  dorzolamide (TRUSOPT) 2 % ophthalmic solution Place 1 drop into both eyes 3 (three) times daily.  12/02/17  Yes [provider]  LUMIGAN 0.01 % SOLN Place 1 drop into both eyes at bedtime. 11/10/17  Yes [provider]  meloxicam (MOBIC) 15 MG tablet Take 15 mg by mouth daily. 12/07/17  Yes [provider]  saxagliptin HCl (ONGLYZA) 5 MG TABS tablet Take 5 mg by mouth daily.   Yes [provider]  simvastatin (ZOCOR) 20 MG tablet Take 20 mg by mouth daily.    Yes [provider]  vitamin C (ASCORBIC ACID) 500 MG tablet Take 500 mg by mouth daily.   Yes [provider]  ferrous sulfate 325 (65 FE) MG tablet Take 1 tablet (325 mg total) by mouth 3 (three) times daily with meals. Patient not taking: Reported on 12/17/2017 08/01/12   Marinda Elk, MD  mometasone (ELOCON) 0.1 % cream Apply 1 application topically daily. Patient not taking: Reported on  12/17/2017 07/14/13   Linna Hoff, MD     Allergies:     Allergies  Allergen Reactions  . Penicillins Anaphylaxis     Physical Exam:   Vitals  Blood pressure (!) 131/58, pulse 71, temperature 98.4 F (36.9 C), temperature source Oral, resp. rate 16, height 5' 1.5" (1.562 m), weight 62.6 kg, SpO2 100 %.   1. General  lying in bed in NAD,    2. Normal affect and insight, Not Suicidal or Homicidal, Awake Alert, Oriented X 3.  3. No F.N deficits, ALL C.Nerves Intact, Strength 5/5 all 4 extremities, Sensation intact all 4 extremities, Plantars down going.  4. Ears and Eyes appear Normal, Conjunctivae clear, PERRLA. Moist Oral Mucosa.  5. Supple Neck, No JVD, No cervical lymphadenopathy appriciated, No Carotid Bruits.  6. Symmetrical Chest wall movement, Good air movement bilaterally, CTAB.  7. RRR, No Gallops, Rubs or Murmurs, No Parasternal Heave.  8. Positive Bowel Sounds, Abdomen Soft, No tenderness, No organomegaly appriciated,No rebound -guarding or rigidity.  9.  No Cyanosis, Normal Skin Turgor, No Skin Rash or Bruise.  10. Good muscle tone,  joints appear normal , no effusions, Normal ROM.  11. No Palpable Lymph Nodes in Neck or Axillae      Data Review:    CBC Recent Labs  Lab 12/17/17 0328  WBC 7.7  HGB 12.1  HCT 36.9  PLT 146*  MCV 98.9  MCH 32.4  MCHC 32.8  RDW 12.3   ------------------------------------------------------------------------------------------------------------------  Chemistries  Recent Labs  Lab 12/17/17 0328  NA 141  K 4.7  CL 108  CO2 22  GLUCOSE 170*  BUN 17  CREATININE 0.83  CALCIUM 9.1  AST 19  ALT 17  ALKPHOS 71  BILITOT 0.9   ------------------------------------------------------------------------------------------------------------------ estimated creatinine clearance is 41.8 mL/min (by C-G formula based on SCr of 0.83  mg/dL). ------------------------------------------------------------------------------------------------------------------ No results for input(s): TSH, T4TOTAL, T3FREE, THYROIDAB in the last 72 hours.  Invalid input(s): FREET3  Coagulation profile No results for input(s): INR, PROTIME in the last 168 hours. ------------------------------------------------------------------------------------------------------------------- No results for input(s): DDIMER in the last 72 hours. -------------------------------------------------------------------------------------------------------------------  Cardiac Enzymes No results for input(s): CKMB, TROPONINI, MYOGLOBIN in the last 168 hours.  Invalid input(s): CK ------------------------------------------------------------------------------------------------------------------ No results found for: BNP   ---------------------------------------------------------------------------------------------------------------  Urinalysis    Component Value Date/Time   COLORURINE STRAW (A) 07/31/2012 0044   APPEARANCEUR CLEAR 07/31/2012 0044   LABSPEC 1.008 07/31/2012 0044   PHURINE 6.5 07/31/2012 0044   GLUCOSEU 250 (A) 07/31/2012 0044   HGBUR NEGATIVE 07/31/2012 0044   BILIRUBINUR NEGATIVE 07/31/2012 0044   KETONESUR NEGATIVE 07/31/2012 0044   PROTEINUR NEGATIVE 07/31/2012 0044   UROBILINOGEN 0.2 07/31/2012 0044   NITRITE NEGATIVE 07/31/2012 0044   LEUKOCYTESUR NEGATIVE 07/31/2012 0044    ----------------------------------------------------------------------------------------------------------------   Imaging Results:    No results found.     Assessment & Plan:    Active Problems:   Lower GI bleed   DM2 (diabetes mellitus, type 2) (HCC)   HTN (hypertension)   Lower GI bleeding    Lower GI bleeding, rectal bleeding NPO Type and screen  Cbc in am Please consult GI in am  Diarrhea C. Diff GI pathogen panel  Dm2 fsbs q4h ,  ISS Cont onglyza=> tradjenta  Hypertension Cont Amlodipine 10mg  po qday (note that here is a drug interaction between this and simvastatin)  Hyperlipidemia Cont Simvastatin 20mg  po qhs    DVT Prophylaxis - SCDs  AM Labs Ordered, also please review Full Orders  Family Communication: Admission, patients condition and plan of care including tests being ordered have been discussed with the patient who indicate understanding and agree with the plan and Code Status.  Code Status  FULL CODE  Likely DC to  home  Condition GUARDED    Consults called: please contact [facility] in am  Admission status: observation  Time spent in minutes : 70   Pearson Grippe M.D on 12/17/2017 at 5:10 AM  Between 7am to 7pm - Pager - [phone]   After 7pm go to [email or web address] - password [facility]  [facility] - Office  [phone]

## 2017-12-17 NOTE — ED Notes (Signed)
Patient wheeled to B17 by a tech and immediately she got to the room, the tech called for help on the patient. On arrival, patient was seen leaning on the lt side and foaming. Became unresponsive for about 2 mins. Pt was also seen bleeding profusely rectally with clots. Was quickly helped on the stretcher by nursing staff. Pt was seen gaining consciousness back after been put on the stretcher. Attending was at the bedside during the incident. Patient also had an episode of brady's into 30's twice while she was been cleaned up. Felt nauseas and sweaty. A/O X4 currently.

## 2017-12-17 NOTE — Consult Note (Signed)
Reason for Consult: Hematochezia and anemia Referring Physician: [facility]  [name] HPI: This is an 84 year old female with a PMH of diverticular bleed in 2014, HTN, and a personal history of adenomas admitted for hematochezia.  She woke this AM at 0130 with the urge to have a bowel movement, but she only passed blood.  After the second episode she presented to the ER for further evaluation and treatment.  The patient reported three additional episodes of hematochezia, but she denies any further bleeding.  She does not have any complaints of SOB or chest pain.  Overall she feels well and she is eager to go home.  She under went colonoscopies with Dr. Loreta Ave in 1999 and 2014.  The 2009 colonoscopy was positive for a small ascending colon adenoma.  At that time she was admitted for hematochezia that was determined to be a diverticular bleed.  Her colonoscopy in 2014 was undertaken for follow up of the prior ascending colon adenoma.  She was noted to have a pandiverticulosis.  Past Medical History:  Diagnosis Date  . Diabetes mellitus without complication (HCC)   . Diverticulitis   . Hypercholesterolemia   . Hypertension   . Shingles     Past Surgical History:  Procedure Laterality Date  . ABDOMINAL HYSTERECTOMY    . COLONOSCOPY    . TOTAL HIP ARTHROPLASTY  2005   left sided    Family History  Problem Relation Age of Onset  . CAD Father     Social History:  reports that she has never smoked. She has never used smokeless tobacco. She reports that she drinks alcohol. She reports that she does not use drugs.  Allergies:  Allergies  Allergen Reactions  . Penicillins Anaphylaxis    Medications:  Scheduled: . amLODipine  10 mg Oral Daily  . brimonidine  1 drop Both Eyes Q12H   And  . timolol  1 drop Both Eyes Q12H  . brinzolamide  1 drop Both Eyes QHS  . dorzolamide  1 drop Both Eyes TID  . insulin aspart  0-9 Units Subcutaneous TID WC  . latanoprost  1 drop Both Eyes  QHS  . linagliptin  5 mg Oral Daily  . simvastatin  20 mg Oral Daily   Continuous:   Results for orders placed or performed during the hospital encounter of 12/17/17 (from the past 24 hour(s))  Type and screen [facility]     Status: None   Collection Time: 12/17/17  3:24 AM  Result Value Ref Range   ABO/RH(D) O NEG    Antibody Screen NEG    Sample Expiration      12/20/2017 Performed at [facility], [address], [place], [place] [address]   Comprehensive metabolic panel     Status: Abnormal   Collection Time: 12/17/17  3:28 AM  Result Value Ref Range   Sodium 141 135 - 145 mmol/L   Potassium 4.7 3.5 - 5.1 mmol/L   Chloride 108 98 - 111 mmol/L   CO2 22 22 - 32 mmol/L   Glucose, Bld 170 (H) 70 - 99 mg/dL   BUN 17 8 - 23 mg/dL   Creatinine, Ser 6.21 0.44 - 1.00 mg/dL   Calcium 9.1 8.9 - 30.8 mg/dL   Total Protein 6.0 (L) 6.5 - 8.1 g/dL   Albumin 3.7 3.5 - 5.0 g/dL   AST 19 15 - 41 U/L   ALT 17 0 - 44 U/L  Alkaline Phosphatase 71 38 - 126 U/L   Total Bilirubin 0.9 0.3 - 1.2 mg/dL   GFR calc non Af Amer >60 >60 mL/min   GFR calc Af Amer >60 >60 mL/min   Anion gap 11 5 - 15  CBC     Status: Abnormal   Collection Time: 12/17/17  3:28 AM  Result Value Ref Range   WBC 7.7 4.0 - 10.5 K/uL   RBC 3.73 (L) 3.87 - 5.11 MIL/uL   Hemoglobin 12.1 12.0 - 15.0 g/dL   HCT 32.3 55.7 - 32.2 %   MCV 98.9 78.0 - 100.0 fL   MCH 32.4 26.0 - 34.0 pg   MCHC 32.8 30.0 - 36.0 g/dL   RDW 02.5 42.7 - 06.2 %   Platelets 146 (L) 150 - 400 K/uL  Glucose, capillary     Status: Abnormal   Collection Time: 12/17/17  6:03 AM  Result Value Ref Range   Glucose-Capillary 137 (H) 70 - 99 mg/dL  Comprehensive metabolic panel     Status: Abnormal   Collection Time: 12/17/17  7:21 AM  Result Value Ref Range   Sodium 141 135 - 145 mmol/L   Potassium 4.2 3.5 - 5.1 mmol/L   Chloride 113 (H) 98 - 111 mmol/L   CO2 20 (L) 22 - 32 mmol/L   Glucose, Bld 150 (H) 70 - 99 mg/dL   BUN  14 8 - 23 mg/dL   Creatinine, Ser 3.76 0.44 - 1.00 mg/dL   Calcium 8.2 (L) 8.9 - 10.3 mg/dL   Total Protein 5.4 (L) 6.5 - 8.1 g/dL   Albumin 3.2 (L) 3.5 - 5.0 g/dL   AST 17 15 - 41 U/L   ALT 16 0 - 44 U/L   Alkaline Phosphatase 61 38 - 126 U/L   Total Bilirubin 1.0 0.3 - 1.2 mg/dL   GFR calc non Af Amer >60 >60 mL/min   GFR calc Af Amer >60 >60 mL/min   Anion gap 8 5 - 15  CBC     Status: Abnormal   Collection Time: 12/17/17  7:21 AM  Result Value Ref Range   WBC 8.5 4.0 - 10.5 K/uL   RBC 3.34 (L) 3.87 - 5.11 MIL/uL   Hemoglobin 10.8 (L) 12.0 - 15.0 g/dL   HCT 28.3 (L) 15.1 - 76.1 %   MCV 99.1 78.0 - 100.0 fL   MCH 32.3 26.0 - 34.0 pg   MCHC 32.6 30.0 - 36.0 g/dL   RDW 60.7 37.1 - 06.2 %   Platelets 120 (L) 150 - 400 K/uL  Protime-INR     Status: None   Collection Time: 12/17/17  7:21 AM  Result Value Ref Range   Prothrombin Time 13.5 11.4 - 15.2 seconds   INR 1.04   Glucose, capillary     Status: Abnormal   Collection Time: 12/17/17 10:54 AM  Result Value Ref Range   Glucose-Capillary 121 (H) 70 - 99 mg/dL  Glucose, capillary     Status: Abnormal   Collection Time: 12/17/17  4:42 PM  Result Value Ref Range   Glucose-Capillary 214 (H) 70 - 99 mg/dL   Comment 1 Notify RN    Comment 2 Document in Chart      No results found.  ROS:  As stated above in the HPI otherwise negative.  Blood pressure 135/61, pulse 68, temperature 98.6 F (37 C), temperature source Oral, resp. rate 18, height 5' 1.5" (1.562 m), weight 62.8 kg, SpO2 100 %.  PE: Gen: NAD, Alert and Oriented HEENT:  [place]/AT, EOMI Neck: Supple, no LAD Lungs: CTA Bilaterally CV: RRR without M/G/R ABM: Soft, NTND, +BS Ext: No C/C/E  Assessment/Plan: 1) Probable diverticular bleed. 2) Anemia. 3) Personal history of a polyp.   The patient is hemodynamically stable.  She does not complain about any chest pain, SOB, or abdominal pain.  Her clinical presentation is consistent with a diverticular bleed.   Further evaluation, in her case, is not warranted at this time.  Her HGB dropped as anticipated, but she is not at the level that requires a blood transfusion.  Plan: 1) Follow HGB and transfuse if necessary. 2) If by late morning tomorrow she does not have any further bleeding, she can be discharged home and follow up with Dr. Loreta Ave. 3) Advance to a regular diet in the AM.  Juris Gosnell D 12/17/2017, 5:05 PM

## 2017-12-18 DIAGNOSIS — R42 Dizziness and giddiness: Secondary | ICD-10-CM | POA: Diagnosis not present

## 2017-12-18 DIAGNOSIS — D62 Acute posthemorrhagic anemia: Secondary | ICD-10-CM | POA: Diagnosis not present

## 2017-12-18 DIAGNOSIS — K922 Gastrointestinal hemorrhage, unspecified: Secondary | ICD-10-CM | POA: Diagnosis not present

## 2017-12-18 DIAGNOSIS — I472 Ventricular tachycardia: Secondary | ICD-10-CM | POA: Diagnosis not present

## 2017-12-18 DIAGNOSIS — E119 Type 2 diabetes mellitus without complications: Secondary | ICD-10-CM | POA: Diagnosis not present

## 2017-12-18 DIAGNOSIS — K5731 Diverticulosis of large intestine without perforation or abscess with bleeding: Secondary | ICD-10-CM | POA: Diagnosis not present

## 2017-12-18 LAB — CBC WITH DIFFERENTIAL/PLATELET
Abs Immature Granulocytes: 0 10*3/uL (ref 0.0–0.1)
Basophils Absolute: 0 10*3/uL (ref 0.0–0.1)
Basophils Relative: 0 %
Eosinophils Absolute: 0.1 10*3/uL (ref 0.0–0.7)
Eosinophils Relative: 2 %
HCT: 31.5 % — ABNORMAL LOW (ref 36.0–46.0)
Hemoglobin: 10.3 g/dL — ABNORMAL LOW (ref 12.0–15.0)
Immature Granulocytes: 1 %
Lymphocytes Relative: 25 %
Lymphs Abs: 1.7 10*3/uL (ref 0.7–4.0)
MCH: 32.6 pg (ref 26.0–34.0)
MCHC: 32.7 g/dL (ref 30.0–36.0)
MCV: 99.7 fL (ref 78.0–100.0)
Monocytes Absolute: 0.5 10*3/uL (ref 0.1–1.0)
Monocytes Relative: 7 %
Neutro Abs: 4.5 10*3/uL (ref 1.7–7.7)
Neutrophils Relative %: 65 %
Platelets: 123 10*3/uL — ABNORMAL LOW (ref 150–400)
RBC: 3.16 MIL/uL — ABNORMAL LOW (ref 3.87–5.11)
RDW: 12.5 % (ref 11.5–15.5)
WBC: 7 10*3/uL (ref 4.0–10.5)

## 2017-12-18 LAB — BASIC METABOLIC PANEL
Anion gap: 7 (ref 5–15)
BUN: 8 mg/dL (ref 8–23)
CO2: 26 mmol/L (ref 22–32)
Calcium: 9 mg/dL (ref 8.9–10.3)
Chloride: 109 mmol/L (ref 98–111)
Creatinine, Ser: 0.74 mg/dL (ref 0.44–1.00)
GFR calc Af Amer: >60 mL/min (ref >60)
GFR calc non Af Amer: >60 mL/min (ref >60)
Glucose, Bld: 160 mg/dL — ABNORMAL HIGH (ref 70–99)
Potassium: 3.7 mmol/L (ref 3.5–5.1)
Sodium: 142 mmol/L (ref 135–145)

## 2017-12-18 LAB — TSH: TSH: 0.444 u[IU]/mL (ref 0.350–4.500)

## 2017-12-18 LAB — MAGNESIUM: Magnesium: 1.8 mg/dL (ref 1.7–2.4)

## 2017-12-18 LAB — HEMOGLOBIN A1C
Hgb A1c MFr Bld: 6.4 % — ABNORMAL HIGH (ref 4.8–5.6)
Mean Plasma Glucose: 136.98 mg/dL

## 2017-12-18 LAB — GLUCOSE, CAPILLARY: Glucose-Capillary: 121 mg/dL — ABNORMAL HIGH (ref 70–99)

## 2017-12-18 MED ORDER — FERROUS SULFATE 325 (65 FE) MG PO TABS: 325.0000 mg | ORAL_TABLET | ORAL | 0 refills | Status: DC

## 2017-12-18 MED ORDER — POTASSIUM CHLORIDE CRYS ER 20 MEQ PO TBCR
40.0000 meq | EXTENDED_RELEASE_TABLET | Freq: Once | ORAL | Status: AC
  Administered 2017-12-18: 40 meq via ORAL
  Filled 2017-12-18: qty 2

## 2017-12-18 MED ORDER — POLYETHYLENE GLYCOL 3350 17 G PO PACK: 17.0000 g | PACK | Freq: Every day | ORAL | 0 refills | Status: DC

## 2017-12-18 MED ORDER — METOPROLOL TARTRATE 25 MG PO TABS: 12.5000 mg | ORAL_TABLET | Freq: Two times a day (BID) | ORAL | 0 refills | Status: DC

## 2017-12-18 MED ORDER — MELOXICAM 15 MG PO TABS: 15.0000 mg | ORAL_TABLET | Freq: Every day | ORAL | 0 refills | Status: DC | PRN

## 2017-12-18 MED ORDER — MAGNESIUM OXIDE 400 MG PO TABS: 400.0000 mg | ORAL_TABLET | Freq: Every day | ORAL | 0 refills | Status: DC

## 2017-12-18 NOTE — Progress Notes (Signed)
Patient Discharge: Disposition: Patient discharged to home with husband. Education: Reviewed medications, prescriptions, discharge instructions, and follow-up appointments with patient and husband,  Verbalized understanding. IV: Discontinued IVs x 2 before discharge. Telemetry: Discontinued Tele before discharge. Transportation: Patient escorted out of the unit in w/c out of the unit. Belongings: Patient took all her belongings with her.

## 2017-12-18 NOTE — Progress Notes (Signed)
    Progress Note Weekend coverage for Dr. Elnoria Howard   Subjective  Chief Complaint: diverticular bleed  Patient with no further bleeding overnight, hemoglobin remains stable.  She is ready to go home today.  Daughter did call and asked to speak with GI, I did sit next to Dr. Roda Shutters while she was on the phone.    Objective   Vital signs in last 24 hours: Temp:  [97.6 F (36.4 C)-98.7 F (37.1 C)] 97.6 F (36.4 C) (09/07 0905) Pulse Rate:  [61-72] 64 (09/07 0905) Resp:  [18-20] 18 (09/07 0905) BP: (108-144)/(47-64) 144/64 (09/07 0905) SpO2:  [98 %-100 %] 99 % (09/07 0905) Weight:  [62.8 kg] 62.8 kg (09/06 2046) Last BM Date: 12/17/17 General:    AA female in NAD Heart:  Regular rate and rhythm; no murmurs Lungs: Respirations even and unlabored, lungs CTA bilaterally Abdomen:  Soft, nontender and nondistended. Normal bowel sounds. Extremities:  Without edema. Neurologic:  Alert and oriented,  grossly normal neurologically. Psych:  Cooperative. Normal mood and affect.  Intake/Output from previous day: 09/06 0701 - 09/07 0700 In: 1231.5 [P.O.:660; I.V.:571.5] Out: 1101 [Urine:1101]  Lab Results: Recent Labs    12/17/17 0328 12/17/17 0721 12/18/17 0721  WBC 7.7 8.5 7.0  HGB 12.1 10.8* 10.3*  HCT 36.9 33.1* 31.5*  PLT 146* 120* 123*   BMET Recent Labs    12/17/17 0328 12/17/17 0721 12/18/17 0721  NA 141 141 142  K 4.7 4.2 3.7  CL 108 113* 109  CO2 22 20* 26  GLUCOSE 170* 150* 160*  BUN 17 14 8   CREATININE 0.83 0.68 0.74  CALCIUM 9.1 8.2* 9.0   LFT Recent Labs    12/17/17 0721  PROT 5.4*  ALBUMIN 3.2*  AST 17  ALT 16  ALKPHOS 61  BILITOT 1.0   PT/INR Recent Labs    12/17/17 0721  LABPROT 13.5  INR 1.04     Assessment / Plan:   Assessment: 1.  Diverticular bleed: Likely diverticular bleed, history of severe diverticulosis on last colonoscopy in 2014, resolved 2.  Anemia: Hemoglobin remains stable 10.8-->10.3 today 3.  Episode of syncope: While in  the ER when passing stool, likely vasovagal  Plan: 1.  Patient may be discharged home today.  Per Dr. Roda Shutters she recommended close follow-up with Dr. Loreta Ave next couple of weeks to discuss constipation and try to prevent further episodes of diverticular bleeding. 2.  I did sit next to Dr. Roda Shutters she was on the phone with daughter and answered all of her questions in regards to diverticular bleeding and the patient's episode of syncope which was likely vasovagal in the ER after passing so much blood.  After speaking with Dr. Roda Shutters, daughter did not ask to speak with GI any further. 3.  Patient may be discharged home today   LOS: 0 days   Unk Lightning  12/18/2017, 10:32 AM

## 2017-12-18 NOTE — Discharge Summary (Addendum)
Discharge Summary  [name] HKV:[id number] DOB: August 05, 1928  PCP: Renaye Rakers, MD  Admit date: 12/17/2017 Discharge date: 12/18/2017  Time spent:  Recommendations for Outpatient Follow-up:  1. F/u with PMD within a week  for hospital discharge follow up, repeat cbc/bmp at follow up. 2. F/u with cardiology for NSVT/heart murmur /syncope 3. F/u with GI Dr Loreta Ave for diverticula bleed  Discharge Diagnoses:  Active Hospital Problems   Diagnosis Date Noted  . Lower GI bleeding 12/17/2017  . Lower GI bleed 07/31/2012  . DM2 (diabetes mellitus, type 2) (HCC) 07/31/2012  . HTN (hypertension) 07/31/2012    Resolved Hospital Problems  No resolved problems to display.    Discharge Condition: stable  Diet recommendation: heart healthy/carb modified  Filed Weights   12/17/17 0310 12/17/17 0604 12/17/17 2046  Weight: 62.6 kg 62.8 kg 62.8 kg    History of present illness: (per admitting MD Dr Selena Batten) [name]  is a 84 y.o. female, w hypertension, dm2, diverticulitis presents with c/o brbpr x3.  2 at home this evening and then 1x in ED.  Pt notes loose stool.  N/v x1 (no bloody emesis)   Pt denies fever, chills, constipation, black stool.  Pt denies aspirin use or blood thinner.    In ED,  T 98.4  P 68  Bp 139/56 pox 100% RA  Wbc 7.7, hgb 12.1, Plt 146 Na 141, k 4.7, Bun 17, Creatinine 0.83 Ast 19, Alt 17  Type and screen  Pt will be admitted for lower gi bleeding likely diverticular  Hospital Course:  Active Problems:   Lower GI bleed   DM2 (diabetes mellitus, type 2) (HCC)   HTN (hypertension)   Lower GI bleeding   BRBPR x3 , last episode in ED -Denies pain -With h/o diverticula bleed -Hemodynamically stable, hgb stable, no more bleed since in the hospital -GI  Dr Elnoria Howard consulted, input appreciated.  Per ED RN, patient has passed out, with bradycardia down in to the 30's while having profuse rectal bleeding with clots She reports felt nauses and  sweaty during the episode. Patient denies LOC. She likely has a vasovagal episode while having bloody bm.  NSVT 9beats on tele, she denies symptom tsh 0.4 Keep K.4, mag>2 Start low dose lopressor with holding parametes  Soft heart murmur at right upper sternal border: She denies chest pain, no sob She reports baseline very active, walk daily 1- , no symptom  HTN -d/c novasc, change to low dose lopressor due to NSVT on tele  noninsulin dependent dm2  a1c 6.4 On ssi in the hospital Hold home oral meds held In the hospital, resumed at discharge.  HLD Continue statin     Code Status: full  Family Communication: patient and family  Disposition Plan: home when cleared by GI   Consultants:  GI Dr Elnoria Howard  Procedures:  none  Antibiotics:  none   Discharge Exam: BP (!) 144/64 (BP Location: Left Arm)   Pulse 64   Temp 97.6 F (36.4 C) (Oral)   Resp 18   Ht 5' 1.5" (1.562 m)   Wt 62.8 kg   SpO2 99%   BMI 25.74 kg/m   General: NAD Cardiovascular: RRR Respiratory: CTABL  Discharge Instructions You were cared for by a hospitalist during your hospital stay. If you have any questions about your discharge medications or the care you received while you were in the hospital after you are discharged, you can call the unit and asked to speak with the  hospitalist on call if the hospitalist that took care of you is not available. Once you are discharged, your primary care physician will handle any further medical issues. Please note that NO REFILLS for any discharge medications will be authorized once you are discharged, as it is imperative that you return to your primary care physician (or establish a relationship with a primary care physician if you do not have one) for your aftercare needs so that they can reassess your need for medications and monitor your lab values.  Discharge Instructions    Diet general   Complete by:  As directed    Carb  modified diet   Increase activity slowly   Complete by:  As directed      Allergies as of 12/18/2017      Reactions   Penicillins Anaphylaxis      Medication List    STOP taking these medications   amLODipine 10 MG tablet Commonly known as:  NORVASC   mometasone 0.1 % cream Commonly known as:  ELOCON     TAKE these medications   COMBIGAN 0.2-0.5 % ophthalmic solution Generic drug:  brimonidine-timolol Place 1 drop into both eyes every 12 (twelve) hours.   diclofenac sodium 1 % Gel Commonly known as:  VOLTAREN Apply 4 g topically 4 (four) times daily.   dorzolamide 2 % ophthalmic solution Commonly known as:  TRUSOPT Place 1 drop into both eyes 3 (three) times daily.   ferrous sulfate 325 (65 FE) MG tablet Take 1 tablet (325 mg total) by mouth every Monday, Wednesday, and Friday. Start taking on:  12/20/2017 What changed:  when to take this   LUMIGAN 0.01 % Soln Generic drug:  bimatoprost Place 1 drop into both eyes at bedtime.   magnesium oxide 400 MG tablet Commonly known as:  MAG-OX Take 1 tablet (400 mg total) by mouth daily.   meloxicam 15 MG tablet Commonly known as:  MOBIC Take 1 tablet (15 mg total) by mouth daily as needed for pain. What changed:    when to take this  reasons to take this   metoprolol tartrate 25 MG tablet Commonly known as:  LOPRESSOR Take 0.5 tablets (12.5 mg total) by mouth 2 (two) times daily.   ONGLYZA 5 MG Tabs tablet Generic drug:  saxagliptin HCl Take 5 mg by mouth daily.   SIMBRINZA 1-0.2 % Susp Generic drug:  Brinzolamide-Brimonidine Place 1 drop into both eyes at bedtime.   simvastatin 20 MG tablet Commonly known as:  ZOCOR Take 20 mg by mouth daily.   vitamin C 500 MG tablet Commonly known as:  ASCORBIC ACID Take 500 mg by mouth daily.      Allergies  Allergen Reactions  . Penicillins Anaphylaxis   Follow-up Information    Charna Elizabeth, MD Follow up in 2 week(s).   Specialty:  Gastroenterology Why:   diverticula bleed Contact information: [address], [place] [address] [phone]        Renaye Rakers, MD Follow up in 4 day(s).   Specialty:  Family Medicine Why:  hospital discharge follow up, repeat cbc/bmp. Contact information: [address] [place] [address] [phone]        [facility] [address] Office Follow up.   Specialty:  Cardiology Why:  for cardiac monitoring for NSVT/bradycardia  Contact information: [address] [place] [address] [phone]           The results of significant  diagnostics from this hospitalization (including imaging, microbiology, ancillary and laboratory) are listed below for reference.    Significant Diagnostic Studies: No results found.  Microbiology: No results found for this or any previous visit (from the past 240 hour(s)).   Labs: Basic Metabolic Panel: Recent Labs  Lab 12/17/17 0328 12/17/17 0721 12/18/17 0721  NA 141 141 142  K 4.7 4.2 3.7  CL 108 113* 109  CO2 22 20* 26  GLUCOSE 170* 150* 160*  BUN 17 14 8   CREATININE 0.83 0.68 0.74  CALCIUM 9.1 8.2* 9.0  MG  --   --  1.8   Liver Function Tests: Recent Labs  Lab 12/17/17 0328 12/17/17 0721  AST 19 17  ALT 17 16  ALKPHOS 71 61  BILITOT 0.9 1.0  PROT 6.0* 5.4*  ALBUMIN 3.7 3.2*   No results for input(s): LIPASE, AMYLASE in the last 168 hours. No results for input(s): AMMONIA in the last 168 hours. CBC: Recent Labs  Lab 12/17/17 0328 12/17/17 0721 12/18/17 0721  WBC 7.7 8.5 7.0  NEUTROABS  --   --  4.5  HGB 12.1 10.8* 10.3*  HCT 36.9 33.1* 31.5*  MCV 98.9 99.1 99.7  PLT 146* 120* 123*   Cardiac Enzymes: No results for input(s): CKTOTAL, CKMB, CKMBINDEX, TROPONINI in the last 168 hours. BNP: BNP (last 3 results) No results for input(s): BNP in the last 8760 hours.  ProBNP (last 3 results) No results for input(s): PROBNP in the last 8760 hours.  CBG: Recent Labs    Lab 12/17/17 0603 12/17/17 1054 12/17/17 1642 12/17/17 2046 12/18/17 0726  GLUCAP 137* 121* 214* 148* 121*       Signed:  Albertine Grates MD, PhD  [facility] 12/18/2017, 9:46 AM

## 2017-12-20 ENCOUNTER — Other Ambulatory Visit: Payer: Self-pay | Admitting: Physician Assistant

## 2017-12-20 DIAGNOSIS — R55 Syncope and collapse: Secondary | ICD-10-CM

## 2017-12-29 ENCOUNTER — Ambulatory Visit (INDEPENDENT_AMBULATORY_CARE_PROVIDER_SITE_OTHER): Payer: Medicare Other

## 2017-12-29 ENCOUNTER — Other Ambulatory Visit: Payer: Self-pay | Admitting: Physician Assistant

## 2017-12-29 DIAGNOSIS — R001 Bradycardia, unspecified: Secondary | ICD-10-CM

## 2017-12-29 DIAGNOSIS — I472 Ventricular tachycardia: Secondary | ICD-10-CM | POA: Diagnosis not present

## 2017-12-29 DIAGNOSIS — I4729 Other ventricular tachycardia: Secondary | ICD-10-CM

## 2017-12-29 DIAGNOSIS — R55 Syncope and collapse: Secondary | ICD-10-CM

## 2018-01-14 ENCOUNTER — Telehealth: Payer: Self-pay

## 2018-01-14 MED ORDER — METOPROLOL TARTRATE 25 MG PO TABS: 25.0000 mg | ORAL_TABLET | Freq: Two times a day (BID) | ORAL | 3 refills | Status: DC

## 2018-01-14 NOTE — Telephone Encounter (Signed)
Received Monitor results on the pt showing Afib and per Dr. Excell Seltzer pt advised since she reports that she is asymptomatic to increase her metoprolol to 25mg  po bid and to be seen in the Afib clinic Monday 01/17/18.Marland Kitchen Pt verbalized understanding of instructions and agrees to go to the ER over the weekend if she starts to feel bad or has another syncopal episode.

## 2018-01-17 ENCOUNTER — Encounter (HOSPITAL_COMMUNITY): Payer: Self-pay | Admitting: Nurse Practitioner

## 2018-01-17 ENCOUNTER — Ambulatory Visit (HOSPITAL_COMMUNITY)
Admission: RE | Admit: 2018-01-17 | Discharge: 2018-01-17 | Disposition: A | Discharge status: Home / Self Care | Payer: Medicare Other | Source: Ambulatory Visit | Attending: Nurse Practitioner | Admitting: Nurse Practitioner

## 2018-01-17 VITALS — BP 136/58 | HR 52 | Ht 61.5 in | Wt 134.0 lb

## 2018-01-17 DIAGNOSIS — I10 Essential (primary) hypertension: Secondary | ICD-10-CM | POA: Diagnosis not present

## 2018-01-17 DIAGNOSIS — Z791 Long term (current) use of non-steroidal anti-inflammatories (NSAID): Secondary | ICD-10-CM | POA: Insufficient documentation

## 2018-01-17 DIAGNOSIS — Z90710 Acquired absence of both cervix and uterus: Secondary | ICD-10-CM | POA: Diagnosis not present

## 2018-01-17 DIAGNOSIS — Z79899 Other long term (current) drug therapy: Secondary | ICD-10-CM | POA: Insufficient documentation

## 2018-01-17 DIAGNOSIS — Z96642 Presence of left artificial hip joint: Secondary | ICD-10-CM | POA: Insufficient documentation

## 2018-01-17 DIAGNOSIS — Z880 Allergy status to penicillin: Secondary | ICD-10-CM | POA: Insufficient documentation

## 2018-01-17 DIAGNOSIS — Z8249 Family history of ischemic heart disease and other diseases of the circulatory system: Secondary | ICD-10-CM | POA: Diagnosis not present

## 2018-01-17 DIAGNOSIS — E7800 Pure hypercholesterolemia, unspecified: Secondary | ICD-10-CM | POA: Insufficient documentation

## 2018-01-17 DIAGNOSIS — E119 Type 2 diabetes mellitus without complications: Secondary | ICD-10-CM | POA: Insufficient documentation

## 2018-01-17 DIAGNOSIS — I480 Paroxysmal atrial fibrillation: Secondary | ICD-10-CM | POA: Diagnosis present

## 2018-01-17 NOTE — Progress Notes (Addendum)
Primary Care Physician: Renaye Rakers, MD Referring Physician: [facility] [place] f/u    [name] is a 84 y.o. female with a h/o recent  hospitalization for diverticula bleed. She was noted to by ED RN to have syncope with HR in the 30's while having profuse rectal bleeding with clots. She also had some SVT on monitor and left with a holter monitor. This did show paroxysmal afib and she is now in the afib clinic to further discuss. She states that her bleeding spontaneously resolved and she was told she would not need a f/u colonoscopy. She is now in need of anticoagulation with new onset afib and with a chadsvasc score of 4. She is in SR today and was asymptomatic with afib on monitor. Her BB was increased when afib was found. She is chronically anemic with plt's around 120.    Today, she denies symptoms of palpitations, chest pain, shortness of breath, orthopnea, PND, lower extremity edema, dizziness, presyncope, syncope, or neurologic sequela. The patient is tolerating medications without difficulties and is otherwise without complaint today.   Past Medical History:  Diagnosis Date  . Diabetes mellitus without complication (HCC)   . Diverticulitis   . Hypercholesterolemia   . Hypertension   . Shingles    Past Surgical History:  Procedure Laterality Date  . ABDOMINAL HYSTERECTOMY    . COLONOSCOPY    . TOTAL HIP ARTHROPLASTY  2005   left sided    Current Outpatient Medications  Medication Sig Dispense Refill  . brimonidine-timolol (COMBIGAN) 0.2-0.5 % ophthalmic solution Place 1 drop into both eyes every 12 (twelve) hours.    . Brinzolamide-Brimonidine (SIMBRINZA) 1-0.2 % SUSP Place 1 drop into both eyes at bedtime.     . diclofenac sodium (VOLTAREN) 1 % GEL Apply 4 g topically 4 (four) times daily.   2  . dorzolamide (TRUSOPT) 2 % ophthalmic solution Place 1 drop into both eyes 3 (three) times daily.   5  . ferrous sulfate 325 (65 FE) MG tablet Take 1 tablet (325 mg total) by mouth every  Monday, Wednesday, and Friday. 30 tablet 0  . LUMIGAN 0.01 % SOLN Place 1 drop into both eyes at bedtime.  5  . magnesium oxide (MAG-OX) 400 MG tablet Take 1 tablet (400 mg total) by mouth daily. 30 tablet 0  . meloxicam (MOBIC) 15 MG tablet Take 1 tablet (15 mg total) by mouth daily as needed for pain. 10 tablet 0  . metoprolol tartrate (LOPRESSOR) 25 MG tablet Take 1 tablet (25 mg total) by mouth 2 (two) times daily. 180 tablet 3  . polyethylene glycol (MIRALAX) packet Take 17 g by mouth daily. 14 each 0  . saxagliptin HCl (ONGLYZA) 5 MG TABS tablet Take 5 mg by mouth daily.    . simvastatin (ZOCOR) 20 MG tablet Take 20 mg by mouth daily.     . vitamin C (ASCORBIC ACID) 500 MG tablet Take 500 mg by mouth daily.     No current facility-administered medications for this encounter.     Allergies  Allergen Reactions  . Penicillins Anaphylaxis    Social History   Socioeconomic History  . Marital status: Married    Spouse name: Not on file  . Number of children: Not on file  . Years of education: Not on file  . Highest education level: Not on file  Occupational History  . Not on file  Social Needs  . Financial resource strain: Not on file  . Food insecurity:  Worry: Not on file    Inability: Not on file  . Transportation needs:    Medical: Not on file    Non-medical: Not on file  Tobacco Use  . Smoking status: Never Smoker  . Smokeless tobacco: Never Used  Substance and Sexual Activity  . Alcohol use: Yes  . Drug use: No  . Sexual activity: Not on file  Lifestyle  . Physical activity:    Days per week: Not on file    Minutes per session: Not on file  . Stress: Not on file  Relationships  . Social connections:    Talks on phone: Not on file    Gets together: Not on file    Attends religious service: Not on file    Active member of club or organization: Not on file    Attends meetings of clubs or organizations: Not on file    Relationship status: Not on file  .  Intimate partner violence:    Fear of current or ex partner: Not on file    Emotionally abused: Not on file    Physically abused: Not on file    Forced sexual activity: Not on file  Other Topics Concern  . Not on file  Social History Narrative  . Not on file    Family History  Problem Relation Age of Onset  . CAD Father     ROS- All systems are reviewed and negative except as per the HPI above  Physical Exam: Vitals:   01/17/18 1331  BP: (!) 136/58  Pulse: (!) 52  Weight: 60.8 kg  Height: 5' 1.5" (1.562 m)   Wt Readings from Last 3 Encounters:  01/17/18 60.8 kg  12/17/17 62.8 kg  08/01/12 68.2 kg    Labs: Lab Results  Component Value Date   NA 142 12/18/2017   K 3.7 12/18/2017   CL 109 12/18/2017   CO2 26 12/18/2017   GLUCOSE 160 (H) 12/18/2017   BUN 8 12/18/2017   CREATININE 0.74 12/18/2017   CALCIUM 9.0 12/18/2017   PHOS 4.3 03/23/2008   MG 1.8 12/18/2017   Lab Results  Component Value Date   INR 1.04 12/17/2017   No results found for: CHOL, HDL, LDLCALC, TRIG   GEN- The patient is well appearing, alert and oriented x 3 today.   Head- normocephalic, atraumatic Eyes-  Sclera clear, conjunctiva pink Ears- hearing intact Oropharynx- clear Neck- supple, no JVP Lymph- no cervical lymphadenopathy Lungs- Clear to ausculation bilaterally, normal work of breathing Heart- Regular rate and rhythm, no murmurs, rubs or gallops, PMI not laterally displaced GI- soft, NT, ND, + BS Extremities- no clubbing, cyanosis, or edema MS- no significant deformity or atrophy Skin- no rash or lesion Psych- euthymic mood, full affect Neuro- strength and sensation are intact  EKG- Sinus brady at 52 bpm, PR int 180 ms, qrs int 80 ms, qtc 398 ms Epic records reviewed Holter monitor reviewed showing  afib with RVR    Assessment and Plan: 1. New onset paroxymal asymptomatic afib  General education re afib In SR today  Continue metoprolol 25 mg bid Will need echo   2.  Chadsvasc score of at least 4 Pt would benefit to start anticoagulation to lower stroke risk Will need opinion  of Dr. Loreta Ave if pt can be started on anticoagulation with recent GI bleed  After I get that info, I will back in touch with pt to further discuss  Addendum: 10/11- I did not hear back from Dr.  Loreta Ave so I called their office and was told that Dr. Loreta Ave is out of the office for a couple of weeks. In the interim, I spoke to Dr. Elnoria Howard and he said it was OK from GI standpoint to start DOAC with close f/u. I will start eliquis 2.5 mg bid (low weight and age over 48) . She cannot  come by today to get 30 day free coupon but will be here on Monday at 1:30 to get drug. She will need a cbc in 2-4 weeks after starting drug.  Elvina Sidle [facility] [address] [place], [place] [address] [phone]

## 2018-01-21 NOTE — Addendum Note (Signed)
Encounter addended by: Newman Nip, NP on: 01/21/2018 3:15 PM  Actions taken: Sign clinical note

## 2018-01-24 ENCOUNTER — Ambulatory Visit (HOSPITAL_COMMUNITY)
Admission: RE | Admit: 2018-01-24 | Discharge: 2018-01-24 | Disposition: A | Discharge status: Home / Self Care | Payer: Medicare Other | Source: Ambulatory Visit | Attending: Nurse Practitioner | Admitting: Nurse Practitioner

## 2018-01-24 DIAGNOSIS — I480 Paroxysmal atrial fibrillation: Secondary | ICD-10-CM

## 2018-01-24 MED ORDER — APIXABAN 2.5 MG PO TABS: 2.5000 mg | ORAL_TABLET | Freq: Two times a day (BID) | ORAL | 6 refills | Status: DC

## 2018-01-24 MED ORDER — APIXABAN 2.5 MG PO TABS: 2.5000 mg | ORAL_TABLET | Freq: Two times a day (BID) | ORAL | 0 refills | Status: DC

## 2018-01-24 NOTE — Progress Notes (Addendum)
Patient and husband in to go over risks/benefits of starting Eliquis with Rudi Coco, NP. All questions answered pt to start Eliquis 2.5mg  twice a day. Appt made to recheck cbc in 2 weeks.  Reviewed bleeding precautions with pt and husband. Expressed understanding how to take drug, avoid NSAIDS and will see back for tolerance in  2 weeks and for CBC. See my note  from  Office visit 10/7.

## 2018-01-24 NOTE — Patient Instructions (Signed)
Start Eliquis 2.5 mg twice a day

## 2018-02-07 ENCOUNTER — Ambulatory Visit (HOSPITAL_COMMUNITY)
Admission: RE | Admit: 2018-02-07 | Discharge: 2018-02-07 | Disposition: A | Discharge status: Home / Self Care | Payer: Medicare Other | Source: Ambulatory Visit | Attending: Nurse Practitioner | Admitting: Nurse Practitioner

## 2018-02-07 ENCOUNTER — Encounter (HOSPITAL_COMMUNITY): Payer: Self-pay | Admitting: Nurse Practitioner

## 2018-02-07 VITALS — BP 118/58 | HR 50 | Ht 61.5 in | Wt 135.0 lb

## 2018-02-07 DIAGNOSIS — Z90710 Acquired absence of both cervix and uterus: Secondary | ICD-10-CM | POA: Insufficient documentation

## 2018-02-07 DIAGNOSIS — Z880 Allergy status to penicillin: Secondary | ICD-10-CM | POA: Insufficient documentation

## 2018-02-07 DIAGNOSIS — Z79899 Other long term (current) drug therapy: Secondary | ICD-10-CM | POA: Insufficient documentation

## 2018-02-07 DIAGNOSIS — Z7901 Long term (current) use of anticoagulants: Secondary | ICD-10-CM | POA: Insufficient documentation

## 2018-02-07 DIAGNOSIS — Z8249 Family history of ischemic heart disease and other diseases of the circulatory system: Secondary | ICD-10-CM | POA: Diagnosis not present

## 2018-02-07 DIAGNOSIS — I10 Essential (primary) hypertension: Secondary | ICD-10-CM | POA: Diagnosis not present

## 2018-02-07 DIAGNOSIS — I4891 Unspecified atrial fibrillation: Secondary | ICD-10-CM | POA: Diagnosis present

## 2018-02-07 DIAGNOSIS — I480 Paroxysmal atrial fibrillation: Secondary | ICD-10-CM | POA: Diagnosis not present

## 2018-02-07 DIAGNOSIS — Z96642 Presence of left artificial hip joint: Secondary | ICD-10-CM | POA: Insufficient documentation

## 2018-02-07 DIAGNOSIS — E119 Type 2 diabetes mellitus without complications: Secondary | ICD-10-CM | POA: Diagnosis not present

## 2018-02-07 DIAGNOSIS — E7800 Pure hypercholesterolemia, unspecified: Secondary | ICD-10-CM | POA: Diagnosis not present

## 2018-02-07 DIAGNOSIS — Z7984 Long term (current) use of oral hypoglycemic drugs: Secondary | ICD-10-CM | POA: Diagnosis not present

## 2018-02-07 LAB — CBC
HCT: 38.9 % (ref 36.0–46.0)
Hemoglobin: 12.7 g/dL (ref 12.0–15.0)
MCH: 31.8 pg (ref 26.0–34.0)
MCHC: 32.6 g/dL (ref 30.0–36.0)
MCV: 97.5 fL (ref 80.0–100.0)
Platelets: 128 10*3/uL — ABNORMAL LOW (ref 150–400)
RBC: 3.99 MIL/uL (ref 3.87–5.11)
RDW: 11.9 % (ref 11.5–15.5)
WBC: 4.8 10*3/uL (ref 4.0–10.5)
nRBC: 0 % (ref 0.0–0.2)

## 2018-02-07 NOTE — Progress Notes (Signed)
Primary Care Physician: Renaye Rakers, MD Referring Physician: [facility] f/u    [name] is a 84 y.o. female with a h/o recent  hospitalization for diverticula bleed. She was noted to by ED RN to have syncope with HR in the 30's while having profuse rectal bleeding with clots. She also had some SVT on monitor and left with a holter monitor. This did show paroxysmal afib and she is now in the afib clinic to further discuss. She states that her bleeding spontaneously resolved and she was told she would not need a f/u colonoscopy. She is now in need of anticoagulation with new onset afib and with a chadsvasc score of 4. She is in SR today and was asymptomatic with afib on monitor. Her BB was increased when afib was found. She is chronically anemic with plt's around 120.   F/u in afib clinic 10/28. She was started on anticoagulation after hearing back from Dr. Elnoria Howard that it was reasonable to start and so far she is doing well. No signs of bleeding. No awareness of afib.   Today, she denies symptoms of palpitations, chest pain, shortness of breath, orthopnea, PND, lower extremity edema, dizziness, presyncope, syncope, or neurologic sequela. The patient is tolerating medications without difficulties and is otherwise without complaint today.   Past Medical History:  Diagnosis Date  . Diabetes mellitus without complication (HCC)   . Diverticulitis   . Hypercholesterolemia   . Hypertension   . Shingles    Past Surgical History:  Procedure Laterality Date  . ABDOMINAL HYSTERECTOMY    . COLONOSCOPY    . TOTAL HIP ARTHROPLASTY  2005   left sided    Current Outpatient Medications  Medication Sig Dispense Refill  . apixaban (ELIQUIS) 2.5 MG TABS tablet Take 1 tablet (2.5 mg total) by mouth 2 (two) times daily. 60 tablet 6  . brimonidine-timolol (COMBIGAN) 0.2-0.5 % ophthalmic solution Place 1 drop into both eyes every 12 (twelve) hours.    . Brinzolamide-Brimonidine (SIMBRINZA) 1-0.2 % SUSP Place  1 drop into both eyes at bedtime.     . diclofenac sodium (VOLTAREN) 1 % GEL Apply 4 g topically 4 (four) times daily.   2  . dorzolamide (TRUSOPT) 2 % ophthalmic solution Place 1 drop into both eyes 3 (three) times daily.   5  . LUMIGAN 0.01 % SOLN Place 1 drop into both eyes at bedtime.  5  . magnesium oxide (MAG-OX) 400 MG tablet Take 1 tablet (400 mg total) by mouth daily. 30 tablet 0  . metoprolol tartrate (LOPRESSOR) 25 MG tablet Take 1 tablet (25 mg total) by mouth 2 (two) times daily. 180 tablet 3  . polyethylene glycol (MIRALAX) packet Take 17 g by mouth daily. 14 each 0  . saxagliptin HCl (ONGLYZA) 5 MG TABS tablet Take 5 mg by mouth daily.    . simvastatin (ZOCOR) 20 MG tablet Take 20 mg by mouth daily.     . vitamin C (ASCORBIC ACID) 500 MG tablet Take 500 mg by mouth daily.    . ferrous sulfate 325 (65 FE) MG tablet Take 1 tablet (325 mg total) by mouth every Monday, Wednesday, and Friday. (Patient not taking: Reported on 02/07/2018) 30 tablet 0   No current facility-administered medications for this encounter.     Allergies  Allergen Reactions  . Penicillins Anaphylaxis    Social History   Socioeconomic History  . Marital status: Married    Spouse name: Not on file  . Number of  children: Not on file  . Years of education: Not on file  . Highest education level: Not on file  Occupational History  . Not on file  Social Needs  . Financial resource strain: Not on file  . Food insecurity:    Worry: Not on file    Inability: Not on file  . Transportation needs:    Medical: Not on file    Non-medical: Not on file  Tobacco Use  . Smoking status: Never Smoker  . Smokeless tobacco: Never Used  Substance and Sexual Activity  . Alcohol use: Yes  . Drug use: No  . Sexual activity: Not on file  Lifestyle  . Physical activity:    Days per week: Not on file    Minutes per session: Not on file  . Stress: Not on file  Relationships  . Social connections:    Talks on  phone: Not on file    Gets together: Not on file    Attends religious service: Not on file    Active member of club or organization: Not on file    Attends meetings of clubs or organizations: Not on file    Relationship status: Not on file  . Intimate partner violence:    Fear of current or ex partner: Not on file    Emotionally abused: Not on file    Physically abused: Not on file    Forced sexual activity: Not on file  Other Topics Concern  . Not on file  Social History Narrative  . Not on file    Family History  Problem Relation Age of Onset  . CAD Father     ROS- All systems are reviewed and negative except as per the HPI above  Physical Exam: Vitals:   02/07/18 0906  BP: (!) 118/58  Pulse: (!) 50  Weight: 61.2 kg  Height: 5' 1.5" (1.562 m)   Wt Readings from Last 3 Encounters:  02/07/18 61.2 kg  01/17/18 60.8 kg  12/17/17 62.8 kg    Labs: Lab Results  Component Value Date   NA 142 12/18/2017   K 3.7 12/18/2017   CL 109 12/18/2017   CO2 26 12/18/2017   GLUCOSE 160 (H) 12/18/2017   BUN 8 12/18/2017   CREATININE 0.74 12/18/2017   CALCIUM 9.0 12/18/2017   PHOS 4.3 03/23/2008   MG 1.8 12/18/2017   Lab Results  Component Value Date   INR 1.04 12/17/2017   No results found for: CHOL, HDL, LDLCALC, TRIG   GEN- The patient is well appearing, alert and oriented x 3 today.   Head- normocephalic, atraumatic Eyes-  Sclera clear, conjunctiva pink Ears- hearing intact Oropharynx- clear Neck- supple, no JVP Lymph- no cervical lymphadenopathy Lungs- Clear to ausculation bilaterally, normal work of breathing Heart- Regular rate and rhythm, no murmurs, rubs or gallops, PMI not laterally displaced GI- soft, NT, ND, + BS Extremities- no clubbing, cyanosis, or edema MS- no significant deformity or atrophy Skin- no rash or lesion Psych- euthymic mood, full affect Neuro- strength and sensation are intact  EKG- Sinus brady at 50 bpm, PR int 196 ms, qrs int 80  ms, qtc 78 ms Epic records reviewed Holter monitor reviewed showing  afib with RVR    Assessment and Plan: 1. New onset paroxymal asymptomatic afib by holter monitor In SR today  Continue metoprolol 25 mg bid Will schedule echo   2. Chadsvasc score of at least 4 Doing well on anticoagulation without signs of bleeding CBC today  F/u in 3 weeks    Elvina Sidle. [facility] [address] [place], [place] [address] [phone]

## 2018-03-01 ENCOUNTER — Encounter (HOSPITAL_COMMUNITY): Payer: Self-pay | Admitting: Nurse Practitioner

## 2018-03-01 ENCOUNTER — Ambulatory Visit (HOSPITAL_BASED_OUTPATIENT_CLINIC_OR_DEPARTMENT_OTHER)
Admission: RE | Admit: 2018-03-01 | Discharge: 2018-03-01 | Disposition: A | Discharge status: Home / Self Care | Payer: Medicare Other | Source: Ambulatory Visit | Attending: Nurse Practitioner | Admitting: Nurse Practitioner

## 2018-03-01 ENCOUNTER — Ambulatory Visit (HOSPITAL_COMMUNITY)
Admission: RE | Admit: 2018-03-01 | Discharge: 2018-03-01 | Disposition: A | Discharge status: Home / Self Care | Payer: Medicare Other | Source: Ambulatory Visit | Attending: Nurse Practitioner | Admitting: Nurse Practitioner

## 2018-03-01 VITALS — BP 118/54 | HR 41 | Ht 61.5 in | Wt 137.0 lb

## 2018-03-01 DIAGNOSIS — I10 Essential (primary) hypertension: Secondary | ICD-10-CM | POA: Diagnosis not present

## 2018-03-01 DIAGNOSIS — I480 Paroxysmal atrial fibrillation: Secondary | ICD-10-CM | POA: Insufficient documentation

## 2018-03-01 DIAGNOSIS — I083 Combined rheumatic disorders of mitral, aortic and tricuspid valves: Secondary | ICD-10-CM | POA: Diagnosis not present

## 2018-03-01 DIAGNOSIS — E119 Type 2 diabetes mellitus without complications: Secondary | ICD-10-CM | POA: Diagnosis not present

## 2018-03-01 DIAGNOSIS — E785 Hyperlipidemia, unspecified: Secondary | ICD-10-CM | POA: Diagnosis not present

## 2018-03-01 NOTE — Progress Notes (Signed)
  Echocardiogram 2D Echocardiogram has been performed.  Hannah PartridgeBrooke S Tyge [name] 03/01/2018, 8:59 AM

## 2018-03-01 NOTE — Progress Notes (Signed)
Primary Care Physician: Renaye RakersBland, Veita, MD [facility]    Hannah Catha GosselinM Stgermaine is a 84 y.o. female with a h/o recent  hospitalization for diverticula bleed. She was noted to by ED RN to have syncope with HR in the 30's while having profuse rectal bleeding with clots. She also had some SVT on monitor and left with a holter monitor. This did show paroxysmal afib and she is now in the afib clinic to further discuss. She states that her bleeding spontaneously resolved and she was told she would not need a f/u colonoscopy. She is now in need of anticoagulation with new onset afib and with a chadsvasc score of 4. She is in SR today and was asymptomatic with afib on monitor. Her BB was increased when afib was found. She is chronically anemic with plt's around 120.   F/u in afib clinic 10/28. She was started on anticoagulation after hearing back from Dr. Elnoria HowardHung that it was reasonable to start and so far she is doing well. No signs of bleeding. No awareness of afib.  F/u in afib clinic, 11/19. Monitor reviewed that pt wore back in September that showed mostly sinus rhythm with low afib burden; She is in SR today but with a HR of 41 bpm, she is asymptomatic.No bleeding issues with eliquis, cbc done recently showed normal H/H. She had an echo done today, results pending.   Today, she denies symptoms of palpitations, chest pain, shortness of breath, orthopnea, PND, lower extremity edema, dizziness, presyncope, syncope, or neurologic sequela. The patient is tolerating medications without difficulties and is otherwise without complaint today.   Past Medical History:  Diagnosis Date  . Diabetes mellitus without complication (HCC)   . Diverticulitis   . Hypercholesterolemia   . Hypertension   . Shingles    Past Surgical History:  Procedure Laterality Date  . ABDOMINAL HYSTERECTOMY    . COLONOSCOPY    . TOTAL HIP ARTHROPLASTY  2005   left sided    Current Outpatient Medications  Medication  Sig Dispense Refill  . apixaban (ELIQUIS) 2.5 MG TABS tablet Take 1 tablet (2.5 mg total) by mouth 2 (two) times daily. 60 tablet 6  . brimonidine-timolol (COMBIGAN) 0.2-0.5 % ophthalmic solution Place 1 drop into both eyes every 12 (twelve) hours.    . Brinzolamide-Brimonidine (SIMBRINZA) 1-0.2 % SUSP Place 1 drop into both eyes at bedtime.     . diclofenac sodium (VOLTAREN) 1 % GEL Apply 4 g topically 4 (four) times daily.   2  . dorzolamide (TRUSOPT) 2 % ophthalmic solution Place 1 drop into both eyes 3 (three) times daily.   5  . ferrous sulfate 325 (65 FE) MG tablet Take 1 tablet (325 mg total) by mouth every Monday, Wednesday, and Friday. 30 tablet 0  . LUMIGAN 0.01 % SOLN Place 1 drop into both eyes at bedtime.  5  . magnesium oxide (MAG-OX) 400 MG tablet Take 1 tablet (400 mg total) by mouth daily. 30 tablet 0  . metoprolol tartrate (LOPRESSOR) 25 MG tablet Take 1 tablet (25 mg total) by mouth 2 (two) times daily. 180 tablet 3  . polyethylene glycol (MIRALAX) packet Take 17 g by mouth daily. 14 each 0  . saxagliptin HCl (ONGLYZA) 5 MG TABS tablet Take 5 mg by mouth daily.    . simvastatin (ZOCOR) 20 MG tablet Take 20 mg by mouth daily.     . vitamin C (ASCORBIC ACID) 500 MG tablet Take 500 mg by mouth  daily.     No current facility-administered medications for this encounter.     Allergies  Allergen Reactions  . Penicillins Anaphylaxis    Social History   Socioeconomic History  . Marital status: Married    Spouse name: Not on file  . Number of children: Not on file  . Years of education: Not on file  . Highest education level: Not on file  Occupational History  . Not on file  Social Needs  . Financial resource strain: Not on file  . Food insecurity:    Worry: Not on file    Inability: Not on file  . Transportation needs:    Medical: Not on file    Non-medical: Not on file  Tobacco Use  . Smoking status: Never Smoker  . Smokeless tobacco: Never Used  Substance and  Sexual Activity  . Alcohol use: Yes  . Drug use: No  . Sexual activity: Not on file  Lifestyle  . Physical activity:    Days per week: Not on file    Minutes per session: Not on file  . Stress: Not on file  Relationships  . Social connections:    Talks on phone: Not on file    Gets together: Not on file    Attends religious service: Not on file    Active member of club or organization: Not on file    Attends meetings of clubs or organizations: Not on file    Relationship status: Not on file  . Intimate partner violence:    Fear of current or ex partner: Not on file    Emotionally abused: Not on file    Physically abused: Not on file    Forced sexual activity: Not on file  Other Topics Concern  . Not on file  Social History Narrative  . Not on file    Family History  Problem Relation Age of Onset  . CAD Father     ROS- All systems are reviewed and negative except as per the HPI above  Physical Exam: Vitals:   03/01/18 0859  BP: (!) 118/54  Pulse: (!) 41  Weight: 62.1 kg  Height: 5' 1.5" (1.562 m)   Wt Readings from Last 3 Encounters:  03/01/18 62.1 kg  02/07/18 61.2 kg  01/17/18 60.8 kg    Labs: Lab Results  Component Value Date   NA 142 12/18/2017   K 3.7 12/18/2017   CL 109 12/18/2017   CO2 26 12/18/2017   GLUCOSE 160 (H) 12/18/2017   BUN 8 12/18/2017   CREATININE 0.74 12/18/2017   CALCIUM 9.0 12/18/2017   PHOS 4.3 03/23/2008   MG 1.8 12/18/2017   Lab Results  Component Value Date   INR 1.04 12/17/2017   No results found for: CHOL, HDL, LDLCALC, TRIG   GEN- The patient is well appearing, alert and oriented x 3 today.   Head- normocephalic, atraumatic Eyes-  Sclera clear, conjunctiva pink Ears- hearing intact Oropharynx- clear Neck- supple, no JVP Lymph- no cervical lymphadenopathy Lungs- Clear to ausculation bilaterally, normal work of breathing Heart- Regular rate and rhythm, no murmurs, rubs or gallops, PMI not laterally displaced GI-  soft, NT, ND, + BS Extremities- no clubbing, cyanosis, or edema MS- no significant deformity or atrophy Skin- no rash or lesion Psych- euthymic mood, full affect Neuro- strength and sensation are intact  EKG- Sinus brady at 41 bpm  Epic records reviewed Monitor results: 9/18-Study Highlights   The basic rhythm is normal sinus There are  periods of atrial fibrillation with a low overall atrial fib burden There are short runs of pSVT and wide complex tachycardia suspect aberration There are no pathologic pauses > 3 seconds       Assessment and Plan: 1. New onset paroxymal asymptomatic afib by holter monitor Pt asymptomatic  In S brady today, but asymptomatic Continue metoprolol 25 mg bid Pt will but a BP cuff and check her HR at home and let me know after checking 7-10 days to see if reduction in BB is needed  2. Chadsvasc score of at least 4 Doing well on anticoagulation without signs of bleeding CBC done a few weeks ago, normal  F/u in 3 months Will notify pt of echo results when available   Lupita Leash C. [facility] [address] [place], [place] [address] [phone]

## 2018-03-08 ENCOUNTER — Encounter (HOSPITAL_COMMUNITY): Payer: Self-pay | Admitting: *Deleted

## 2018-05-03 ENCOUNTER — Emergency Department (HOSPITAL_COMMUNITY): Payer: Medicare Other

## 2018-05-03 ENCOUNTER — Observation Stay (HOSPITAL_COMMUNITY)
Admission: EM | Admit: 2018-05-03 | Discharge: 2018-05-04 | Disposition: A | Discharge status: Home / Self Care | Payer: Medicare Other | Attending: Internal Medicine | Admitting: Internal Medicine

## 2018-05-03 ENCOUNTER — Other Ambulatory Visit: Payer: Self-pay

## 2018-05-03 ENCOUNTER — Encounter (HOSPITAL_COMMUNITY): Payer: Self-pay | Admitting: Emergency Medicine

## 2018-05-03 DIAGNOSIS — R2681 Unsteadiness on feet: Secondary | ICD-10-CM | POA: Insufficient documentation

## 2018-05-03 DIAGNOSIS — Z96642 Presence of left artificial hip joint: Secondary | ICD-10-CM | POA: Insufficient documentation

## 2018-05-03 DIAGNOSIS — W19XXXA Unspecified fall, initial encounter: Secondary | ICD-10-CM | POA: Diagnosis not present

## 2018-05-03 DIAGNOSIS — Z8249 Family history of ischemic heart disease and other diseases of the circulatory system: Secondary | ICD-10-CM | POA: Insufficient documentation

## 2018-05-03 DIAGNOSIS — G8929 Other chronic pain: Secondary | ICD-10-CM | POA: Insufficient documentation

## 2018-05-03 DIAGNOSIS — K5730 Diverticulosis of large intestine without perforation or abscess without bleeding: Secondary | ICD-10-CM | POA: Insufficient documentation

## 2018-05-03 DIAGNOSIS — Z9181 History of falling: Secondary | ICD-10-CM | POA: Insufficient documentation

## 2018-05-03 DIAGNOSIS — I480 Paroxysmal atrial fibrillation: Secondary | ICD-10-CM

## 2018-05-03 DIAGNOSIS — R55 Syncope and collapse: Principal | ICD-10-CM | POA: Diagnosis present

## 2018-05-03 DIAGNOSIS — Z79899 Other long term (current) drug therapy: Secondary | ICD-10-CM | POA: Diagnosis not present

## 2018-05-03 DIAGNOSIS — E119 Type 2 diabetes mellitus without complications: Secondary | ICD-10-CM

## 2018-05-03 DIAGNOSIS — S098XXA Other specified injuries of head, initial encounter: Secondary | ICD-10-CM | POA: Diagnosis not present

## 2018-05-03 DIAGNOSIS — Z7901 Long term (current) use of anticoagulants: Secondary | ICD-10-CM | POA: Diagnosis not present

## 2018-05-03 DIAGNOSIS — M545 Low back pain: Secondary | ICD-10-CM | POA: Insufficient documentation

## 2018-05-03 DIAGNOSIS — Z880 Allergy status to penicillin: Secondary | ICD-10-CM | POA: Diagnosis not present

## 2018-05-03 DIAGNOSIS — I10 Essential (primary) hypertension: Secondary | ICD-10-CM | POA: Diagnosis not present

## 2018-05-03 DIAGNOSIS — Z791 Long term (current) use of non-steroidal anti-inflammatories (NSAID): Secondary | ICD-10-CM | POA: Diagnosis not present

## 2018-05-03 DIAGNOSIS — E785 Hyperlipidemia, unspecified: Secondary | ICD-10-CM | POA: Insufficient documentation

## 2018-05-03 LAB — URINALYSIS, ROUTINE W REFLEX MICROSCOPIC
Bilirubin Urine: NEGATIVE
Glucose, UA: 150 mg/dL — AB
Hgb urine dipstick: NEGATIVE
Ketones, ur: NEGATIVE mg/dL
Leukocytes, UA: NEGATIVE
Nitrite: NEGATIVE
Protein, ur: NEGATIVE mg/dL
Specific Gravity, Urine: 1.004 — ABNORMAL LOW (ref 1.005–1.030)
pH: 7 (ref 5.0–8.0)

## 2018-05-03 LAB — COMPREHENSIVE METABOLIC PANEL
ALT: 24 U/L (ref 0–44)
AST: 24 U/L (ref 15–41)
Albumin: 4.2 g/dL (ref 3.5–5.0)
Alkaline Phosphatase: 57 U/L (ref 38–126)
Anion gap: 7 (ref 5–15)
BUN: 13 mg/dL (ref 8–23)
CO2: 27 mmol/L (ref 22–32)
Calcium: 9.9 mg/dL (ref 8.9–10.3)
Chloride: 108 mmol/L (ref 98–111)
Creatinine, Ser: 0.79 mg/dL (ref 0.44–1.00)
GFR calc Af Amer: >60 mL/min (ref >60)
GFR calc non Af Amer: >60 mL/min (ref >60)
Glucose, Bld: 109 mg/dL — ABNORMAL HIGH (ref 70–99)
Potassium: 4.8 mmol/L (ref 3.5–5.1)
Sodium: 142 mmol/L (ref 135–145)
Total Bilirubin: 1.5 mg/dL — ABNORMAL HIGH (ref 0.3–1.2)
Total Protein: 6.7 g/dL (ref 6.5–8.1)

## 2018-05-03 LAB — GLUCOSE, CAPILLARY
Glucose-Capillary: 173 mg/dL — ABNORMAL HIGH (ref 70–99)
Glucose-Capillary: 188 mg/dL — ABNORMAL HIGH (ref 70–99)

## 2018-05-03 LAB — CBC WITH DIFFERENTIAL/PLATELET
Abs Immature Granulocytes: 0.02 10*3/uL (ref 0.00–0.07)
Basophils Absolute: 0 10*3/uL (ref 0.0–0.1)
Basophils Relative: 1 %
Eosinophils Absolute: 0.1 10*3/uL (ref 0.0–0.5)
Eosinophils Relative: 1 %
HCT: 41.8 % (ref 36.0–46.0)
Hemoglobin: 13.6 g/dL (ref 12.0–15.0)
Immature Granulocytes: 0 %
Lymphocytes Relative: 34 %
Lymphs Abs: 2 10*3/uL (ref 0.7–4.0)
MCH: 30.7 pg (ref 26.0–34.0)
MCHC: 32.5 g/dL (ref 30.0–36.0)
MCV: 94.4 fL (ref 80.0–100.0)
Monocytes Absolute: 0.5 10*3/uL (ref 0.1–1.0)
Monocytes Relative: 9 %
Neutro Abs: 3.4 10*3/uL (ref 1.7–7.7)
Neutrophils Relative %: 55 %
Platelets: 144 10*3/uL — ABNORMAL LOW (ref 150–400)
RBC: 4.43 MIL/uL (ref 3.87–5.11)
RDW: 13.2 % (ref 11.5–15.5)
WBC: 6.1 10*3/uL (ref 4.0–10.5)
nRBC: 0 % (ref 0.0–0.2)

## 2018-05-03 LAB — I-STAT TROPONIN, ED: Troponin i, poc: 0 ng/mL (ref 0.00–0.08)

## 2018-05-03 LAB — LIPASE, BLOOD: Lipase: 39 U/L (ref 11–51)

## 2018-05-03 LAB — CBG MONITORING, ED: Glucose-Capillary: 135 mg/dL — ABNORMAL HIGH (ref 70–99)

## 2018-05-03 MED ORDER — AMLODIPINE BESYLATE 2.5 MG PO TABS
5.0000 mg | ORAL_TABLET | Freq: Every day | ORAL | Status: DC
  Filled 2018-05-03: qty 2

## 2018-05-03 MED ORDER — SIMVASTATIN 20 MG PO TABS
20.0000 mg | ORAL_TABLET | Freq: Every day | ORAL | Status: DC
  Administered 2018-05-04: 20 mg via ORAL
  Filled 2018-05-03: qty 1

## 2018-05-03 MED ORDER — METOPROLOL TARTRATE 25 MG PO TABS: 25.0000 mg | ORAL_TABLET | Freq: Two times a day (BID) | ORAL | Status: DC

## 2018-05-03 MED ORDER — IOHEXOL 300 MG/ML  SOLN
100.0000 mL | Freq: Once | INTRAMUSCULAR | Status: AC | PRN
  Administered 2018-05-03: 100 mL via INTRAVENOUS

## 2018-05-03 MED ORDER — INSULIN ASPART 100 UNIT/ML ~~LOC~~ SOLN
0.0000 [IU] | Freq: Three times a day (TID) | SUBCUTANEOUS | Status: DC
  Administered 2018-05-03: 2 [IU] via SUBCUTANEOUS

## 2018-05-03 MED ORDER — DORZOLAMIDE HCL 2 % OP SOLN
1.0000 [drp] | Freq: Three times a day (TID) | OPHTHALMIC | Status: DC
  Administered 2018-05-03 (×2): 1 [drp] via OPHTHALMIC
  Filled 2018-05-03: qty 10

## 2018-05-03 MED ORDER — SODIUM CHLORIDE 0.9% FLUSH
3.0000 mL | Freq: Two times a day (BID) | INTRAVENOUS | Status: DC
  Administered 2018-05-03 – 2018-05-04 (×2): 3 mL via INTRAVENOUS

## 2018-05-03 MED ORDER — APIXABAN 2.5 MG PO TABS
2.5000 mg | ORAL_TABLET | Freq: Two times a day (BID) | ORAL | Status: DC
  Administered 2018-05-03 – 2018-05-04 (×2): 2.5 mg via ORAL
  Filled 2018-05-03 (×2): qty 1

## 2018-05-03 MED ORDER — BRIMONIDINE TARTRATE 0.2 % OP SOLN
1.0000 [drp] | Freq: Two times a day (BID) | OPHTHALMIC | Status: DC
  Administered 2018-05-03 – 2018-05-04 (×2): 1 [drp] via OPHTHALMIC
  Filled 2018-05-03: qty 5

## 2018-05-03 MED ORDER — TIMOLOL MALEATE 0.5 % OP SOLN
1.0000 [drp] | Freq: Two times a day (BID) | OPHTHALMIC | Status: DC
  Administered 2018-05-04: 1 [drp] via OPHTHALMIC
  Filled 2018-05-03: qty 5

## 2018-05-03 MED ORDER — SODIUM CHLORIDE 0.9 % IV SOLN: 250.0000 mL | INTRAVENOUS | Status: DC | PRN

## 2018-05-03 MED ORDER — LATANOPROST 0.005 % OP SOLN
1.0000 [drp] | Freq: Every day | OPHTHALMIC | Status: DC
  Administered 2018-05-03: 1 [drp] via OPHTHALMIC
  Filled 2018-05-03: qty 2.5

## 2018-05-03 MED ORDER — ONDANSETRON HCL 4 MG/2ML IJ SOLN: 4.0000 mg | Freq: Four times a day (QID) | INTRAMUSCULAR | Status: DC | PRN

## 2018-05-03 MED ORDER — ACETAMINOPHEN 650 MG RE SUPP: 650.0000 mg | Freq: Four times a day (QID) | RECTAL | Status: DC | PRN

## 2018-05-03 MED ORDER — ACETAMINOPHEN 325 MG PO TABS: 650.0000 mg | ORAL_TABLET | Freq: Four times a day (QID) | ORAL | Status: DC | PRN

## 2018-05-03 MED ORDER — SODIUM CHLORIDE 0.9% FLUSH: 3.0000 mL | INTRAVENOUS | Status: DC | PRN

## 2018-05-03 MED ORDER — ONDANSETRON HCL 4 MG PO TABS: 4.0000 mg | ORAL_TABLET | Freq: Four times a day (QID) | ORAL | Status: DC | PRN

## 2018-05-03 MED ORDER — BRIMONIDINE TARTRATE-TIMOLOL 0.2-0.5 % OP SOLN: 1.0000 [drp] | Freq: Two times a day (BID) | OPHTHALMIC | Status: DC

## 2018-05-03 NOTE — Progress Notes (Signed)
Patient's heart rate in the 40's. CCM called to let RN know it had gone as low as 39. MD paged. Pt is lying in her bed talking with family and in no acute distress. Monitoring closely. Kyleigha Markert, [place] Scrape, RN

## 2018-05-03 NOTE — Evaluation (Signed)
Physical Therapy Evaluation Patient Details Name: [name] MRN: [id number] DOB: April 21, 1928 Today's Date: 05/03/2018   History of Present Illness   85 y.o. female  with a PMH of diabetes, hypertension, hyperlipidemia, prior GI bleed who presents with abdominal pain and fall s/p near syncopal eipsode at home.  Clinical Impression  Patient seen for therapy assessment.  Mobilizing well with no noted focal deficits at this time. Educated patient on safety with mobility. No further acute PT needs. Will sign off.     Follow Up Recommendations No PT follow up    Equipment Recommendations  None recommended by PT    Recommendations for Other Services       Precautions / Restrictions Precautions Precautions: Fall      Mobility  Bed Mobility Overal bed mobility: Independent                Transfers Overall transfer level: Independent                  Ambulation/Gait Ambulation/Gait assistance: Independent Gait Distance (Feet): 310 Feet Assistive device: None Gait Pattern/deviations: Decreased stride length;Narrow base of support Gait velocity: decreased Gait velocity interpretation: <1.8 ft/sec, indicate of risk for recurrent falls General Gait Details: patient with modest instability but no overt LOB  Stairs            Wheelchair Mobility    Modified Rankin (Stroke Patients Only)       Balance Overall balance assessment: Needs assistance   Sitting balance-Leahy Scale: Good     Standing balance support: During functional activity Standing balance-Leahy Scale: Good               High level balance activites: Side stepping;Backward walking;Direction changes;Turns;Head turns;Sudden stops High Level Balance Comments: no difficulties             Pertinent Vitals/Pain      Home Living Family/patient expects to be discharged to:: Private residence Living Arrangements: Spouse/significant other Available Help at Discharge:  Family Type of Home: House Home Access: Stairs to enter Entrance Stairs-Rails: Can reach both Entrance Stairs-Number of Steps: 3 Home Layout: One level(except sunroom down 2 steps) Home Equipment: None      Prior Function Level of Independence: Independent               Hand Dominance   Dominant Hand: Right    Extremity/Trunk Assessment   Upper Extremity Assessment Upper Extremity Assessment: Overall WFL for tasks assessed    Lower Extremity Assessment Lower Extremity Assessment: Generalized weakness       Communication   Communication: No difficulties  Cognition Arousal/Alertness: Awake/alert Behavior During Therapy: WFL for tasks assessed/performed Overall Cognitive Status: Within Functional Limits for tasks assessed                                        General Comments      Exercises     Assessment/Plan    PT Assessment Patent does not need any further PT services  PT Problem List         PT Treatment Interventions      PT Goals (Current goals can be found in the Care Plan section)  Acute Rehab PT Goals PT Goal Formulation: All assessment and education complete, DC therapy    Frequency     Barriers to discharge        Co-evaluation  AM-PAC PT "6 Clicks" Mobility  Outcome Measure Help needed turning from your back to your side while in a flat bed without using bedrails?: None Help needed moving from lying on your back to sitting on the side of a flat bed without using bedrails?: None Help needed moving to and from a bed to a chair (including a wheelchair)?: None Help needed standing up from a chair using your arms (e.g., wheelchair or bedside chair)?: A Little Help needed to walk in hospital room?: A Little Help needed climbing 3-5 steps with a railing? : A Little 6 Click Score: 21    End of Session Equipment Utilized During Treatment: Gait belt Activity Tolerance: Patient tolerated treatment  well Patient left: in bed;with call bell/phone within reach;with bed alarm set;with family/visitor present Nurse Communication: Mobility status PT Visit Diagnosis: History of falling (Z91.81)    Time: [id number] PT Time Calculation (min) (ACUTE ONLY): 16 min   Charges:   PT Evaluation $PT Eval Low Complexity: 1 Low          Charlotte Crumbevon Dessa Ledee, PT DPT  Board Certified Neurologic Specialist Acute Rehabilitation Services Pager [phone] Office [phone]   Fabio AsaDevon J Daiel Strohecker 05/03/2018, 4:15 PM

## 2018-05-03 NOTE — ED Triage Notes (Signed)
Pt reports having left sided abd pain that radiates to back for months but had a syncopal episode this morning where she fell and hit her head. Pt denies any blood thinners.

## 2018-05-03 NOTE — ED Provider Notes (Signed)
[facility] EMERGENCY DEPARTMENT Provider Note   CSN: [id number] Arrival date & time: 05/03/18  2440     History   Chief Complaint Chief Complaint  Patient presents with  . Fall  . Abdominal Pain  . Loss of Consciousness    HPI [name] is a 85 y.o. female.  The history is provided by the patient, medical records and the spouse. No language interpreter was used.  Fall  Associated symptoms include abdominal pain and headaches.  Abdominal Pain  Associated symptoms: no constipation, no diarrhea, no dysuria, no nausea and no vomiting   Loss of Consciousness  Associated symptoms: dizziness, headaches and weakness   Associated symptoms: no nausea, no seizures and no vomiting    [name] is a 85 y.o. female  with a PMH of diabetes, hypertension, hyperlipidemia, prior GI bleed who presents to the Emergency Department with 2 complaints:  1.  Left sided abdominal pain which radiates to flank and low back which began about 2 months ago.  She reports her pain has been progressively worsening.  Last night, she got up to use the restroom about 6 times.  This is quite unusual for her.  She denies any dysuria, just urinary frequency.  She is not having any nausea or vomiting.  No chest pain shortness of breath.  No diarrhea or constipation.  No blood noted in the stool.   2.  Near syncopal event today.  Patient states that she was washing her face this morning when she started feeling dizzy.  She describes this as the room spinning.  She did not have any associated chest pain or shortness of breath.  She just became weak and flushed and then fell forward.  She denies any loss of consciousness.  She did strike her head.  She denies any neck pain or back pain or any other injury from the fall.  Husband states that he heard a noise and immediately came into the room within minutes.  She was awake and talking.  Moving as usual and got up off the floor relatively quickly.   No signs of seizure activity.  No confusion or change in her mental status.  Patient denies any nausea or vomiting.  No difficulty with her speech.  No numbness or weakness.  Of note, patient denies being on any anticoagulation, although it does appear per her most recent cardiology visit, she was on Xarelto and doing well with anticoagulation.  Past Medical History:  Diagnosis Date  . Diabetes mellitus without complication (HCC)   . Diverticulitis   . Hypercholesterolemia   . Hypertension   . Shingles     Patient Active Problem List   Diagnosis Date Noted  . Syncope 05/03/2018  . Lower GI bleeding 12/17/2017  . Lower GI bleed 07/31/2012  . DM2 (diabetes mellitus, type 2) (HCC) 07/31/2012  . HTN (hypertension) 07/31/2012    Past Surgical History:  Procedure Laterality Date  . ABDOMINAL HYSTERECTOMY    . COLONOSCOPY    . TOTAL HIP ARTHROPLASTY  2005   left sided     OB History   No obstetric history on file.      Home Medications    Prior to Admission medications   Medication Sig Start Date End Date Taking? Authorizing Provider  amLODipine (NORVASC) 10 MG tablet Take 5 mg by mouth daily. 01/31/18  Yes [provider]  apixaban (ELIQUIS) 2.5 MG TABS tablet Take 1 tablet (2.5 mg total) by mouth 2 (  two) times daily. 01/24/18  Yes Newman Niparroll, Donna C, NP  brimonidine-timolol (COMBIGAN) 0.2-0.5 % ophthalmic solution Place 1 drop into both eyes every 12 (twelve) hours.   Yes [provider]  diclofenac sodium (VOLTAREN) 1 % GEL Apply 4 g topically 4 (four) times daily.  12/07/17  Yes [provider]  dorzolamide (TRUSOPT) 2 % ophthalmic solution Place 1 drop into both eyes 3 (three) times daily.  12/02/17  Yes [provider]  ferrous sulfate 325 (65 FE) MG tablet Take 1 tablet (325 mg total) by mouth every Monday, Wednesday, and Friday. Patient taking differently: Take 325 mg by mouth 2 (two) times a week.  12/20/17  Yes Albertine GratesXu, Fang, MD  LUMIGAN 0.01  % SOLN Place 1 drop into both eyes at bedtime. 11/10/17  Yes [provider]  meloxicam (MOBIC) 15 MG tablet Take 15 mg by mouth daily.   Yes [provider]  metoprolol tartrate (LOPRESSOR) 25 MG tablet Take 1 tablet (25 mg total) by mouth 2 (two) times daily. 01/14/18 05/03/18 Yes Tonny Bollmanooper, Michael, MD  Multiple Vitamins-Minerals (CENTRUM SILVER 50+WOMEN) TABS Take 1 tablet by mouth daily.   Yes [provider]  polyethylene glycol (MIRALAX) packet Take 17 g by mouth daily. Patient taking differently: Take 17 g by mouth daily as needed for mild constipation.  12/18/17  Yes Albertine GratesXu, Fang, MD  saxagliptin HCl (ONGLYZA) 5 MG TABS tablet Take 5 mg by mouth daily.   Yes [provider]  simvastatin (ZOCOR) 20 MG tablet Take 20 mg by mouth daily.    Yes [provider]  sitaGLIPtin (JANUVIA) 100 MG tablet Take 100 mg by mouth daily.   Yes [provider]  magnesium oxide (MAG-OX) 400 MG tablet Take 1 tablet (400 mg total) by mouth daily. Patient not taking: Reported on 05/03/2018 12/18/17   Albertine GratesXu, Fang, MD    Family History Family History  Problem Relation Age of Onset  . CAD Father     Social History Social History   Tobacco Use  . Smoking status: Never Smoker  . Smokeless tobacco: Never Used  Substance Use Topics  . Alcohol use: Yes  . Drug use: No     Allergies   Penicillins   Review of Systems Review of Systems  Cardiovascular: Positive for syncope.  Gastrointestinal: Positive for abdominal pain. Negative for constipation, diarrhea, nausea and vomiting.  Genitourinary: Positive for flank pain and frequency. Negative for dysuria.  Neurological: Positive for dizziness, weakness and headaches. Negative for seizures.  All other systems reviewed and are negative.    Physical Exam Updated Vital Signs BP (!) 163/65   Pulse (!) 58   Temp (!) 97.5 F (36.4 C) (Oral)   Resp (!) 23   Ht 5' 1.5" (1.562 m)   Wt 62.1 kg   SpO2 100%   BMI  25.47 kg/m   Physical Exam Vitals signs and nursing note reviewed.  Constitutional:      General: She is not in acute distress.    Appearance: She is well-developed.     Comments: Pleasant, well-appearing female in no acute distress.  HENT:     Head: Normocephalic and atraumatic.  Neck:     Musculoskeletal: Neck supple.  Cardiovascular:     Rate and Rhythm: Normal rate and regular rhythm.     Heart sounds: Normal heart sounds. No murmur.  Pulmonary:     Effort: Pulmonary effort is normal. No respiratory distress.     Breath sounds: Normal breath sounds.  Abdominal:     General: There is no distension.     Palpations: Abdomen is soft.     Comments: Tenderness to palpation to the left flank and left lower quadrant without rebound or guarding.  Musculoskeletal:     Comments: No tenderness to this C/T/L-spine.  No CVA tenderness.  Skin:    General: Skin is warm and dry.  Neurological:     Mental Status: She is alert and oriented to person, place, and time.     Comments: Alert, oriented, thought content appropriate, able to give a coherent history. Speech is clear and goal oriented, able to follow commands.  Cranial Nerves:  II:  Peripheral visual fields grossly normal, pupils equal, round, reactive to light III, IV, VI: EOM intact bilaterally, ptosis not present V,VII: smile symmetric, eyes kept closed tightly against resistance, facial light touch sensation equal VIII: hearing grossly normal IX, X: symmetric soft palate movement, uvula elevates symmetrically  XI: bilateral shoulder shrug symmetric and strong XII: midline tongue extension 5/5 muscle strength in upper and lower extremities bilaterally including strong and equal grip strength and dorsiflexion/plantar flexion Sensory to light touch normal in all four extremities.  Normal finger-to-nose and rapid alternating movements; No drift.       ED Treatments / Results  Labs (all labs ordered are listed, but only abnormal  results are displayed) Labs Reviewed  CBC WITH DIFFERENTIAL/PLATELET - Abnormal; Notable for the following components:      Result Value   Platelets 144 (*)    All other components within normal limits  COMPREHENSIVE METABOLIC PANEL - Abnormal; Notable for the following components:   Glucose, Bld 109 (*)    Total Bilirubin 1.5 (*)    All other components within normal limits  URINALYSIS, ROUTINE W REFLEX MICROSCOPIC - Abnormal; Notable for the following components:   Specific Gravity, Urine 1.004 (*)    Glucose, UA 150 (*)    All other components within normal limits  CBG MONITORING, ED - Abnormal; Notable for the following components:   Glucose-Capillary 135 (*)    All other components within normal limits  LIPASE, BLOOD  I-STAT TROPONIN, ED    EKG EKG Interpretation  Date/Time:  Tuesday May 03 2018 10:01:12 EST Ventricular Rate:  59 PR Interval:    QRS Duration: 99 QT Interval:  413 QTC Calculation: 410 R Axis:   25 Text Interpretation:  Sinus rhythm Poor data quality in current ECG precludes serial comparison Confirmed by Pricilla Loveless [phone]) on 05/03/2018 11:47:46 AM   Radiology Ct Head Wo Contrast  Result Date: 05/03/2018 CLINICAL DATA:  Syncope.  Head injury. EXAM: CT HEAD WITHOUT CONTRAST TECHNIQUE: Contiguous axial images were obtained from the base of the skull through the vertex without intravenous contrast. COMPARISON:  11/05/2011 FINDINGS: Brain: Global atrophy appropriate to age. There are chronic ischemic changes in the periventricular white matter. There is no mass effect, midline shift, or acute intracranial hemorrhage. Vascular: No hyperdense vessel or unexpected calcification. Skull: Cranium is intact. Sinuses/Orbits: Orbits are within normal limits. Visualized paranasal sinuses are clear. Mastoid air cells are clear. Other: Noncontributory. IMPRESSION: No acute intracranial pathology. Electronically Signed   By: Jolaine Click M.D.   On: 05/03/2018 13:25    Ct Abdomen Pelvis W Contrast  Result Date: 05/03/2018 CLINICAL DATA:  Left abdomen pain. EXAM: CT ABDOMEN AND PELVIS WITH CONTRAST TECHNIQUE: Multidetector CT imaging of the abdomen and pelvis was performed using the standard protocol following bolus administration of intravenous contrast. CONTRAST:   OMNIPAQUE IOHEXOL 300 MG/ML  SOLN COMPARISON:  August 10, 2006 FINDINGS: Lower chest: No acute abnormality. Hepatobiliary: Mild diffuse low density of the liver is identified. No focal liver lesions noted. The gallbladder and biliary tree are normal. Pancreas: Unremarkable. No pancreatic ductal dilatation or surrounding inflammatory changes. Spleen: Normal in size without focal abnormality. Adrenals/Urinary Tract: The adrenal glands are normal. Bilateral kidney cysts are noted. There is no hydronephrosis bilaterally. The bladder is normal. Stomach/Bowel: Stomach is within normal limits. Appendix appears normal. No evidence of bowel wall thickening, distention, or inflammatory changes. There is diverticulosis of colon. Vascular/Lymphatic: Aortic atherosclerosis. No enlarged abdominal or pelvic lymph nodes. Reproductive: Status post hysterectomy. No adnexal masses. Other: None. Musculoskeletal: Degenerative joint changes of the spine are noted. Chronic compression deformity of L3 is noted. IMPRESSION: No acute abnormality. Diverticulosis of colon without diverticulitis. Electronically Signed   By: Sherian ReinWei-Chen  Lin M.D.   On: 05/03/2018 13:18    Procedures Procedures (including critical care time)  Medications Ordered in ED Medications  iohexol (OMNIPAQUE) 300 MG/ML solution 100 mL (100 mLs Intravenous Contrast Given 05/03/18 1258)     Initial Impression / Assessment and Plan / ED Course  I have reviewed the triage vital signs and the nursing notes.  Pertinent labs & imaging results that were available during my care of the patient were reviewed by me and considered in my medical decision making (see  chart for details).    [name] is a 85 y.o. female who presents to ED for two complaints:  1.  Left-sided flank and abdominal pain for a few months now.  She started developing urinary frequency last night.  Denies any dysuria.  Labs reviewed and reassuring.  Urinalysis without signs of infection.  She is tender, but no focal areas of tenderness or rebound/guarding.  CT with no acute abnormalities.  Likely will need PCP follow-up for this if symptoms continue.  2.  Near syncopal episode at about 630 this morning.  Patient states that she acutely became dizzy and fell to the ground, striking her head.  She denies a loss of consciousness and husband came into the room within a few minutes, reporting that she was at her baseline mental status.  There was no evidence of seizure or postictal phase.  EKG and troponin reassuring.  Labs reassuring as well.  CT head without acute findings.  Given her age and risk factors, as well as unknown cause for syncope today, recommended that she be admitted to the hospital for further observation. She agrees. Hospitalist consulted who will admit.    Patient seen by and discussed with Dr. Criss AlvineGoldston who agrees with treatment plan.    Final Clinical Impressions(s) / ED Diagnoses   Final diagnoses:  Near syncope    ED Discharge Orders    None       Ward, Chase PicketJaime Pilcher, [place] JerseyPA-C 05/03/18 1507    Pricilla LovelessGoldston, Scott, MD 05/04/18 [phone]

## 2018-05-03 NOTE — ED Notes (Signed)
Pt states her PCP started her on Januvia about 2 weeks ago

## 2018-05-03 NOTE — H&P (Signed)
History and Physical    [name] BPZ:[id number] DOB: Jul 12, 1928 DOA: 05/03/2018  PCP: Renaye Rakers, MD  Patient coming from: Home  Chief Complaint: Near syncope   HPI: [name] is a 85 y.o. female with medical history significant of paroxysmal A. fib, diverticular disease, type 2 diabetes, hyperlipidemia, hypertension who presents after a near syncopal episode and a fall.  She states that last night, she had very frequent urination about 7 times.  She denied any dysuria.  She was in a standing position when she felt very dizzy, room spinning around her, lightheaded with tunnel vision.  She states that she fell backward but did not lose consciousness.  She did hit her head.  She had no episodes of chest pain or heart palpitations or shortness of breath or nausea or vomiting associated with this episode.  Currently in the emergency department, she is feeling well.  She denies any other complaints other than her chronic low back pain.  ED Course: CT head negative for intracranial pathology. Labs unremarkable. TRH called to observe patient overnight, syncope work up.   Review of Systems: As per HPI otherwise 10 point review of systems negative.   Past Medical History:  Diagnosis Date  . Diabetes mellitus without complication (HCC)   . Diverticulitis   . Hypercholesterolemia   . Hypertension   . Shingles     Past Surgical History:  Procedure Laterality Date  . ABDOMINAL HYSTERECTOMY    . COLONOSCOPY    . TOTAL HIP ARTHROPLASTY  2005   left sided     reports that she has never smoked. She has never used smokeless tobacco. She reports current alcohol use. She reports that she does not use drugs.  Allergies  Allergen Reactions  . Penicillins Anaphylaxis    Did it involve swelling of the face/tongue/throat, SOB, or low BP? Yes Did it involve sudden or severe rash/hives, skin peeling, or any reaction on the inside of your mouth or nose? Yes Did you need to seek medical  attention at a hospital or doctor's office? Yes When did it last happen? Unknown If all above answers are "NO", may proceed with cephalosporin use.     Family History  Problem Relation Age of Onset  . CAD Father     Prior to Admission medications   Medication Sig Start Date End Date Taking? Authorizing Provider  amLODipine (NORVASC) 10 MG tablet Take 5 mg by mouth daily. 01/31/18  Yes [provider]  apixaban (ELIQUIS) 2.5 MG TABS tablet Take 1 tablet (2.5 mg total) by mouth 2 (two) times daily. 01/24/18  Yes Newman Nip, NP  brimonidine-timolol (COMBIGAN) 0.2-0.5 % ophthalmic solution Place 1 drop into both eyes every 12 (twelve) hours.   Yes [provider]  diclofenac sodium (VOLTAREN) 1 % GEL Apply 4 g topically 4 (four) times daily.  12/07/17  Yes [provider]  dorzolamide (TRUSOPT) 2 % ophthalmic solution Place 1 drop into both eyes 3 (three) times daily.  12/02/17  Yes [provider]  ferrous sulfate 325 (65 FE) MG tablet Take 1 tablet (325 mg total) by mouth every Monday, Wednesday, and Friday. Patient taking differently: Take 325 mg by mouth 2 (two) times a week.  12/20/17  Yes Albertine Grates, MD  LUMIGAN 0.01 % SOLN Place 1 drop into both eyes at bedtime. 11/10/17  Yes [provider]  meloxicam (MOBIC) 15 MG tablet Take 15 mg by mouth daily.   Yes [provider]  metoprolol tartrate (LOPRESSOR) 25 MG tablet Take 1 tablet (25 mg total) by mouth 2 (two) times daily. 01/14/18 05/03/18 Yes Tonny Bollman, MD  Multiple Vitamins-Minerals (CENTRUM SILVER 50+WOMEN) TABS Take 1 tablet by mouth daily.   Yes [provider]  polyethylene glycol (MIRALAX) packet Take 17 g by mouth daily. Patient taking differently: Take 17 g by mouth daily as needed for mild constipation.  12/18/17  Yes Albertine Grates, MD  saxagliptin HCl (ONGLYZA) 5 MG TABS tablet Take 5 mg by mouth daily.   Yes [provider]  simvastatin (ZOCOR) 20 MG tablet  Take 20 mg by mouth daily.    Yes [provider]  sitaGLIPtin (JANUVIA) 100 MG tablet Take 100 mg by mouth daily.   Yes [provider]  magnesium oxide (MAG-OX) 400 MG tablet Take 1 tablet (400 mg total) by mouth daily. Patient not taking: Reported on 05/03/2018 12/18/17   Albertine Grates, MD    Physical Exam: Vitals:   05/03/18 1015 05/03/18 1200 05/03/18 1215 05/03/18 1230  BP: (!) 152/60 (!) 141/56 (!) 135/48 (!) 163/65  Pulse: (!) 47 (!) 55 (!) 53 (!) 58  Resp: 18 11 15  (!) 23  Temp:      TempSrc:      SpO2: 100% 100% 100% 100%  Weight:      Height:         Constitutional: NAD, calm, comfortable Eyes: PERRL, lids and conjunctivae normal ENMT: Mucous membranes are moist. Posterior pharynx clear of any exudate or lesions.Normal dentition.  Neck: normal, supple, no masses, no thyromegaly Respiratory: clear to auscultation bilaterally, no wheezing, no crackles. Normal respiratory effort. No accessory muscle use.  Cardiovascular: Regular rate and rhythm, no murmurs / rubs / gallops. No extremity edema.  Abdomen: no tenderness, no masses palpated. No hepatosplenomegaly. Bowel sounds positive.  Musculoskeletal: no clubbing / cyanosis. No joint deformity upper and lower extremities. Good ROM, no contractures. Normal muscle tone.  Skin: no rashes, lesions, ulcers. No induration Neurologic: CN 2-12 grossly intact. Strength 5/5 in all 4.  Psychiatric: Normal judgment and insight. Alert and oriented x 3. Normal mood.   Labs on Admission: I have personally reviewed following labs and imaging studies  CBC: Recent Labs  Lab 05/03/18 1058  WBC 6.1  NEUTROABS 3.4  HGB 13.6  HCT 41.8  MCV 94.4  PLT 144*   Basic Metabolic Panel: Recent Labs  Lab 05/03/18 1058  NA 142  K 4.8  CL 108  CO2 27  GLUCOSE 109*  BUN 13  CREATININE 0.79  CALCIUM 9.9   GFR: Estimated Creatinine Clearance: 42.4 mL/min (by C-G formula based on SCr of 0.79 mg/dL). Liver Function  Tests: Recent Labs  Lab 05/03/18 1058  AST 24  ALT 24  ALKPHOS 57  BILITOT 1.5*  PROT 6.7  ALBUMIN 4.2   Recent Labs  Lab 05/03/18 1058  LIPASE 39   No results for input(s): AMMONIA in the last 168 hours. Coagulation Profile: No results for input(s): INR, PROTIME in the last 168 hours. Cardiac Enzymes: No results for input(s): CKTOTAL, CKMB, CKMBINDEX, TROPONINI in the last 168 hours. BNP (last 3 results) No results for input(s): PROBNP in the last 8760 hours. HbA1C: No results for input(s): HGBA1C in the last 72 hours. CBG: Recent Labs  Lab 05/03/18 1005  GLUCAP 135*   Lipid Profile: No results for input(s): CHOL, HDL, LDLCALC, TRIG, CHOLHDL, LDLDIRECT in the last 72 hours. Thyroid Function Tests: No results for input(s): TSH, T4TOTAL, FREET4, T3FREE,  THYROIDAB in the last 72 hours. Anemia Panel: No results for input(s): VITAMINB12, FOLATE, FERRITIN, TIBC, IRON, RETICCTPCT in the last 72 hours. Urine analysis:    Component Value Date/Time   COLORURINE YELLOW 05/03/2018 1014   APPEARANCEUR CLEAR 05/03/2018 1014   LABSPEC 1.004 (L) 05/03/2018 1014   PHURINE 7.0 05/03/2018 1014   GLUCOSEU 150 (A) 05/03/2018 1014   HGBUR NEGATIVE 05/03/2018 1014   BILIRUBINUR NEGATIVE 05/03/2018 1014   KETONESUR NEGATIVE 05/03/2018 1014   PROTEINUR NEGATIVE 05/03/2018 1014   UROBILINOGEN 0.2 07/31/2012 0044   NITRITE NEGATIVE 05/03/2018 1014   LEUKOCYTESUR NEGATIVE 05/03/2018 1014   Sepsis Labs: !!!!!!!!!!!!!!!!!!!!!!!!!!!!!!!!!!!!!!!!!!!! @LABRCNTIP (procalcitonin:4,lacticidven:4) )No results found for this or any previous visit (from the past 240 hour(s)).   Radiological Exams on Admission: Ct Head Wo Contrast  Result Date: 05/03/2018 CLINICAL DATA:  Syncope.  Head injury. EXAM: CT HEAD WITHOUT CONTRAST TECHNIQUE: Contiguous axial images were obtained from the base of the skull through the vertex without intravenous contrast. COMPARISON:  11/05/2011 FINDINGS: Brain: Global  atrophy appropriate to age. There are chronic ischemic changes in the periventricular white matter. There is no mass effect, midline shift, or acute intracranial hemorrhage. Vascular: No hyperdense vessel or unexpected calcification. Skull: Cranium is intact. Sinuses/Orbits: Orbits are within normal limits. Visualized paranasal sinuses are clear. Mastoid air cells are clear. Other: Noncontributory. IMPRESSION: No acute intracranial pathology. Electronically Signed   By: Jolaine ClickArthur  Hoss M.D.   On: 05/03/2018 13:25   Ct Abdomen Pelvis W Contrast  Result Date: 05/03/2018 CLINICAL DATA:  Left abdomen pain. EXAM: CT ABDOMEN AND PELVIS WITH CONTRAST TECHNIQUE: Multidetector CT imaging of the abdomen and pelvis was performed using the standard protocol following bolus administration of intravenous contrast. CONTRAST:  100mL OMNIPAQUE IOHEXOL 300 MG/ML  SOLN COMPARISON:  August 10, 2006 FINDINGS: Lower chest: No acute abnormality. Hepatobiliary: Mild diffuse low density of the liver is identified. No focal liver lesions noted. The gallbladder and biliary tree are normal. Pancreas: Unremarkable. No pancreatic ductal dilatation or surrounding inflammatory changes. Spleen: Normal in size without focal abnormality. Adrenals/Urinary Tract: The adrenal glands are normal. Bilateral kidney cysts are noted. There is no hydronephrosis bilaterally. The bladder is normal. Stomach/Bowel: Stomach is within normal limits. Appendix appears normal. No evidence of bowel wall thickening, distention, or inflammatory changes. There is diverticulosis of colon. Vascular/Lymphatic: Aortic atherosclerosis. No enlarged abdominal or pelvic lymph nodes. Reproductive: Status post hysterectomy. No adnexal masses. Other: None. Musculoskeletal: Degenerative joint changes of the spine are noted. Chronic compression deformity of L3 is noted. IMPRESSION: No acute abnormality. Diverticulosis of colon without diverticulitis. Electronically Signed   By:  Sherian ReinWei-Chen  Lin M.D.   On: 05/03/2018 13:18    EKG: Independently reviewed.  Normal sinus rhythm  Assessment/Plan Principal Problem:   Syncope Active Problems:   DM2 (diabetes mellitus, type 2) (HCC)   HTN (hypertension)   AF (paroxysmal atrial fibrillation) (HCC)  Near syncope, fall -CT head negative -Echocardiogram was recently completed in March 01, 2018 which showed EF 55 to 60%, no regional wall motion abnormalities, no aortic stenosis at that time.   -Check carotid US -Check orthostatic VS -Monitor on cardiac telemetry -PT OT   Paroxysmal atrial fibrillation -Currently in normal sinus rhythm -Lopressor, Eliquis  HTN -Norvasc, Lopressor  HLD -Zocor  DM type 2, noninsulin dependent -Hold januvia, onglyza -SSI   Chronic low back pain -Supportive care    DVT prophylaxis: Eliquis Code Status: Full code Family Communication: Husband and friend at bedside Disposition Plan: Pending work-up, likely  discharge in the next 24 hours Consults called: None Admission status: Observation  Severity of Illness: The appropriate patient status for this patient is OBSERVATION. Observation status is judged to be reasonable and necessary in order to provide the required intensity of service to ensure the patient's safety. The patient's presenting symptoms, physical exam findings, and initial radiographic and laboratory data in the context of their medical condition is felt to place them at decreased risk for further clinical deterioration. Furthermore, it is anticipated that the patient will be medically stable for discharge from the hospital within 2 midnights of admission. The following factors support the patient status of observation.   " The patient's presenting symptoms include near syncope, dizziness, lightheadedness. " The initial radiographic and laboratory data are largely unremarkable.    Hannah Stain, DO [facility] 05/03/2018, 3:11 PM

## 2018-05-04 ENCOUNTER — Observation Stay (HOSPITAL_BASED_OUTPATIENT_CLINIC_OR_DEPARTMENT_OTHER): Payer: Medicare Other

## 2018-05-04 DIAGNOSIS — R55 Syncope and collapse: Secondary | ICD-10-CM

## 2018-05-04 LAB — CBC
HCT: 38 % (ref 36.0–46.0)
Hemoglobin: 12.4 g/dL (ref 12.0–15.0)
MCH: 30.3 pg (ref 26.0–34.0)
MCHC: 32.6 g/dL (ref 30.0–36.0)
MCV: 92.9 fL (ref 80.0–100.0)
Platelets: 129 10*3/uL — ABNORMAL LOW (ref 150–400)
RBC: 4.09 MIL/uL (ref 3.87–5.11)
RDW: 13.3 % (ref 11.5–15.5)
WBC: 4.6 10*3/uL (ref 4.0–10.5)
nRBC: 0 % (ref 0.0–0.2)

## 2018-05-04 LAB — BASIC METABOLIC PANEL
Anion gap: 9 (ref 5–15)
BUN: 13 mg/dL (ref 8–23)
CO2: 23 mmol/L (ref 22–32)
Calcium: 9.4 mg/dL (ref 8.9–10.3)
Chloride: 109 mmol/L (ref 98–111)
Creatinine, Ser: 0.81 mg/dL (ref 0.44–1.00)
GFR calc Af Amer: >60 mL/min (ref >60)
GFR calc non Af Amer: >60 mL/min (ref >60)
Glucose, Bld: 102 mg/dL — ABNORMAL HIGH (ref 70–99)
Potassium: 3.6 mmol/L (ref 3.5–5.1)
Sodium: 141 mmol/L (ref 135–145)

## 2018-05-04 LAB — GLUCOSE, CAPILLARY
Glucose-Capillary: 126 mg/dL — ABNORMAL HIGH (ref 70–99)
Glucose-Capillary: 163 mg/dL — ABNORMAL HIGH (ref 70–99)

## 2018-05-04 MED ORDER — METOPROLOL TARTRATE 25 MG PO TABS: 12.5000 mg | ORAL_TABLET | Freq: Two times a day (BID) | ORAL | 0 refills | Status: DC

## 2018-05-04 MED ORDER — METOPROLOL TARTRATE 12.5 MG HALF TABLET
12.5000 mg | ORAL_TABLET | Freq: Two times a day (BID) | ORAL | Status: DC
  Filled 2018-05-04: qty 1

## 2018-05-04 NOTE — Progress Notes (Signed)
Carotid artery duplex completed. Refer to "CV Proc" under chart review to view preliminary results.  05/04/2018 11:46 AM Gertie Fey, MHA, RVT, RDCS, RDMS

## 2018-05-04 NOTE — Discharge Summary (Signed)
Physician Discharge Summary  [name] NFA:[id number] DOB: 27-Dec-1928 DOA: 05/03/2018  PCP: Renaye Rakers, MD  Admit date: 05/03/2018 Discharge date: 05/04/2018  Admitted From: Home Disposition: Home  Recommendations for Outpatient Follow-up:  1. Follow up with PCP in 1 week 2. Follow up with [facility] clinic in 1 week   Discharge Condition: Stable CODE STATUS: Full  Diet recommendation: Heart healthy   Brief/Interim Summary: [name] is a 85 y.o. female with medical history significant of paroxysmal A. fib, diverticular disease, type 2 diabetes, hyperlipidemia, hypertension who presents after a near syncopal episode and a fall.  She states that last night, she had very frequent urination about 7 times.  She denied any dysuria.  She was in a standing position when she felt very dizzy, room spinning around her, lightheaded with tunnel vision.  She states that she fell backward but did not lose consciousness.  She did hit her head.  She had no episodes of chest pain or heart palpitations or shortness of breath or nausea or vomiting associated with this episode.  Currently in the emergency department, she is feeling well.  She denies any other complaints other than her chronic low back pain.  ED Course: CT head negative for intracranial pathology. Labs unremarkable. TRH called to observe patient overnight, syncope work up.   Near syncope, fall -CT head negative -Echocardiogram was recently completed in March 01, 2018 which showed EF 55 to 60%, no regional wall motion abnormalities, no aortic stenosis at that time.   -Carotid US: Right Carotid: Velocities in the right ICA are consistent with a 1-39% stenosis. Left Carotid: Velocities in the left ICA are consistent with a 1-39% stenosis. -Orthostatic VS negative  -Monitor on cardiac telemetry -PT OT, no further recs  -Decrease lopressor dose, patient noted to be bradycardic (without symptoms) on telemetry  Paroxysmal atrial  fibrillation -Currently in normal sinus rhythm, bradycardic at times on telemetry  -Lopressor, Eliquis. Decrease lopressor dose, patient noted to be bradycardic (without symptoms) on telemetry   HTN -Norvasc, Lopressor  HLD -Zocor  DM type 2, noninsulin dependent -Hold januvia, onglyza - resume as outpatient   Chronic low back pain -Supportive care    Discharge Instructions  Discharge Instructions    Call MD for:   Complete by:  As directed    Dizziness, lightheadedness   Call MD for:  difficulty breathing, headache or visual disturbances   Complete by:  As directed    Call MD for:  extreme fatigue   Complete by:  As directed    Call MD for:  hives   Complete by:  As directed    Call MD for:  persistant dizziness or light-headedness   Complete by:  As directed    Call MD for:  persistant nausea and vomiting   Complete by:  As directed    Call MD for:  severe uncontrolled pain   Complete by:  As directed    Call MD for:  temperature >100.4   Complete by:  As directed    Diet - low sodium heart healthy   Complete by:  As directed    Discharge instructions   Complete by:  As directed    You were cared for by a hospitalist during your hospital stay. If you have any questions about your discharge medications or the care you received while you were in the hospital after you are discharged, you can call the unit and ask to speak with the hospitalist on call  if the hospitalist that took care of you is not available. Once you are discharged, your primary care physician will handle any further medical issues. Please note that NO REFILLS for any discharge medications will be authorized once you are discharged, as it is imperative that you return to your primary care physician (or establish a relationship with a primary care physician if you do not have one) for your aftercare needs so that they can reassess your need for medications and monitor your lab values.   Increase activity  slowly   Complete by:  As directed      Allergies as of 05/04/2018      Reactions   Penicillins Anaphylaxis   Did it involve swelling of the face/tongue/throat, SOB, or low BP? Yes Did it involve sudden or severe rash/hives, skin peeling, or any reaction on the inside of your mouth or nose? Yes Did you need to seek medical attention at a hospital or doctor's office? Yes When did it last happen? Unknown If all above answers are "NO", may proceed with cephalosporin use.      Medication List    STOP taking these medications   meloxicam 15 MG tablet Commonly known as:  MOBIC     TAKE these medications   amLODipine 10 MG tablet Commonly known as:  NORVASC Take 5 mg by mouth daily.   apixaban 2.5 MG Tabs tablet Commonly known as:  ELIQUIS Take 1 tablet (2.5 mg total) by mouth 2 (two) times daily.   CENTRUM SILVER 50+WOMEN Tabs Take 1 tablet by mouth daily.   COMBIGAN 0.2-0.5 % ophthalmic solution Generic drug:  brimonidine-timolol Place 1 drop into both eyes every 12 (twelve) hours.   diclofenac sodium 1 % Gel Commonly known as:  VOLTAREN Apply 4 g topically 4 (four) times daily.   dorzolamide 2 % ophthalmic solution Commonly known as:  TRUSOPT Place 1 drop into both eyes 3 (three) times daily.   ferrous sulfate 325 (65 FE) MG tablet Take 1 tablet (325 mg total) by mouth every Monday, Wednesday, and Friday. What changed:  when to take this   LUMIGAN 0.01 % Soln Generic drug:  bimatoprost Place 1 drop into both eyes at bedtime.   metoprolol tartrate 25 MG tablet Commonly known as:  LOPRESSOR Take 0.5 tablets (12.5 mg total) by mouth 2 (two) times daily for 30 days. What changed:  how much to take   ONGLYZA 5 MG Tabs tablet Generic drug:  saxagliptin HCl Take 5 mg by mouth daily.   polyethylene glycol packet Commonly known as:  MIRALAX Take 17 g by mouth daily. What changed:    when to take this  reasons to take this   simvastatin 20 MG tablet Commonly  known as:  ZOCOR Take 20 mg by mouth daily.   sitaGLIPtin 100 MG tablet Commonly known as:  JANUVIA Take 100 mg by mouth daily.      Follow-up Information    Renaye RakersBland, Veita, MD. Schedule an appointment as soon as possible for a visit in 1 week(s).   Specialty:  Family Medicine Contact information: [address] [place] [address] [phone]        Newman Niparroll, Donna C, NP. Schedule an appointment as soon as possible for a visit in 1 week(s).   Specialties:  Nurse Practitioner, Cardiology Why:  A fib clinic Contact information: [address] [place] [address] [phone]          Allergies  Allergen Reactions  .  Penicillins Anaphylaxis    Did it involve swelling of the face/tongue/throat, SOB, or low BP? Yes Did it involve sudden or severe rash/hives, skin peeling, or any reaction on the inside of your mouth or nose? Yes Did you need to seek medical attention at a hospital or doctor's office? Yes When did it last happen? Unknown If all above answers are "NO", may proceed with cephalosporin use.     Consultations:  None    Procedures/Studies: Ct Head Wo Contrast  Result Date: 05/03/2018 CLINICAL DATA:  Syncope.  Head injury. EXAM: CT HEAD WITHOUT CONTRAST TECHNIQUE: Contiguous axial images were obtained from the base of the skull through the vertex without intravenous contrast. COMPARISON:  11/05/2011 FINDINGS: Brain: Global atrophy appropriate to age. There are chronic ischemic changes in the periventricular white matter. There is no mass effect, midline shift, or acute intracranial hemorrhage. Vascular: No hyperdense vessel or unexpected calcification. Skull: Cranium is intact. Sinuses/Orbits: Orbits are within normal limits. Visualized paranasal sinuses are clear. Mastoid air cells are clear. Other: Noncontributory. IMPRESSION: No acute intracranial pathology. Electronically Signed   By: Jolaine ClickArthur  Hoss M.D.   On: 05/03/2018 13:25   Ct Abdomen Pelvis W  Contrast  Result Date: 05/03/2018 CLINICAL DATA:  Left abdomen pain. EXAM: CT ABDOMEN AND PELVIS WITH CONTRAST TECHNIQUE: Multidetector CT imaging of the abdomen and pelvis was performed using the standard protocol following bolus administration of intravenous contrast. CONTRAST:  100mL OMNIPAQUE IOHEXOL 300 MG/ML  SOLN COMPARISON:  August 10, 2006 FINDINGS: Lower chest: No acute abnormality. Hepatobiliary: Mild diffuse low density of the liver is identified. No focal liver lesions noted. The gallbladder and biliary tree are normal. Pancreas: Unremarkable. No pancreatic ductal dilatation or surrounding inflammatory changes. Spleen: Normal in size without focal abnormality. Adrenals/Urinary Tract: The adrenal glands are normal. Bilateral kidney cysts are noted. There is no hydronephrosis bilaterally. The bladder is normal. Stomach/Bowel: Stomach is within normal limits. Appendix appears normal. No evidence of bowel wall thickening, distention, or inflammatory changes. There is diverticulosis of colon. Vascular/Lymphatic: Aortic atherosclerosis. No enlarged abdominal or pelvic lymph nodes. Reproductive: Status post hysterectomy. No adnexal masses. Other: None. Musculoskeletal: Degenerative joint changes of the spine are noted. Chronic compression deformity of L3 is noted. IMPRESSION: No acute abnormality. Diverticulosis of colon without diverticulitis. Electronically Signed   By: Sherian ReinWei-Chen  Lin M.D.   On: 05/03/2018 13:18   [place]as [place] Carotid  Result Date: 05/04/2018 Carotid Arterial Duplex Study Indications:  Syncope. Risk Factors: Hypertension, hyperlipidemia. Performing Technologist: Gertie FeyMichelle Simonetti MHA, RDMS, RVT, RDCS  Examination Guidelines: A complete evaluation includes B-mode imaging, spectral Doppler, color Doppler, and power Doppler as needed of all accessible portions of each vessel. Bilateral testing is considered an integral part of a complete examination. Limited examinations for reoccurring  indications may be performed as noted.  Right Carotid Findings: +----------+--------+--------+--------+---------------------+------------------+           PSV cm/sEDV cm/sStenosisDescribe             Comments           +----------+--------+--------+--------+---------------------+------------------+ CCA Prox  86      9                                    intimal thickening +----------+--------+--------+--------+---------------------+------------------+ CCA Distal70      10                                                      +----------+--------+--------+--------+---------------------+------------------+  ICA Prox  40      9               smooth, heterogenous                                                      and calcific                            +----------+--------+--------+--------+---------------------+------------------+ ICA Distal65      14                                                      +----------+--------+--------+--------+---------------------+------------------+ ECA       95                                                              +----------+--------+--------+--------+---------------------+------------------+ +----------+--------+-------+----------------+-------------------+           PSV cm/sEDV cmsDescribe        Arm Pressure (mmHG) +----------+--------+-------+----------------+-------------------+ Subclavian89             Multiphasic, WNL                    +----------+--------+-------+----------------+-------------------+ +---------+--------+--+--------+-+---------+ VertebralPSV cm/s50EDV cm/s7Antegrade +---------+--------+--+--------+-+---------+  Left Carotid Findings: +----------+-------+-------+--------+---------------------------------+--------+           PSV    EDV    StenosisDescribe                         Comments           cm/s   cm/s                                                      +----------+-------+-------+--------+---------------------------------+--------+ CCA Prox  79     14                                                       +----------+-------+-------+--------+---------------------------------+--------+ CCA Distal76     9              smooth and heterogenous                   +----------+-------+-------+--------+---------------------------------+--------+ ICA Prox  78     12             irregular, heterogenous and                                               calcific                                  +----------+-------+-------+--------+---------------------------------+--------+  ICA Distal109    20                                                       +----------+-------+-------+--------+---------------------------------+--------+ ECA       124                                                             +----------+-------+-------+--------+---------------------------------+--------+ +----------+--------+--------+----------------+-------------------+ SubclavianPSV cm/sEDV cm/sDescribe        Arm Pressure (mmHG) +----------+--------+--------+----------------+-------------------+           163             Multiphasic, WNL                    +----------+--------+--------+----------------+-------------------+ +---------+--------+---+--------+--+---------+ VertebralPSV cm/s103EDV cm/s17Antegrade +---------+--------+---+--------+--+---------+  Summary: Right Carotid: Velocities in the right ICA are consistent with a 1-39% stenosis. Left Carotid: Velocities in the left ICA are consistent with a 1-39% stenosis. Vertebrals:  Bilateral vertebral arteries demonstrate antegrade flow. Subclavians: Normal flow hemodynamics were seen in bilateral subclavian              arteries.  Incidental finding: Bilateral thyroid lobes were within view of carotid arteries and exhibited mulitiple heterogenous areas with low resistant flow. Etiology is  unknown; further imaging may be warranted if clinically indicated. *See table(s) above for measurements and observations.     Preliminary       Discharge Exam: Vitals:   05/04/18 0342 05/04/18 1030  BP: (!) 131/56 (!) 96/59  Pulse: (!) 52   Resp: 18   Temp: 99.1 F (37.3 C)   SpO2: 94%     General: Pt is alert, awake, not in acute distress Cardiovascular: RRR, S1/S2 +, no rubs, no gallops Respiratory: CTA bilaterally, no wheezing, no rhonchi Abdominal: Soft, NT, ND, bowel sounds + Extremities: no edema, no cyanosis    The results of significant diagnostics from this hospitalization (including imaging, microbiology, ancillary and laboratory) are listed below for reference.     Microbiology: No results found for this or any previous visit (from the past 240 hour(s)).   Labs: BNP (last 3 results) No results for input(s): BNP in the last 8760 hours. Basic Metabolic Panel: Recent Labs  Lab 05/03/18 1058 05/04/18 0514  NA 142 141  K 4.8 3.6  CL 108 109  CO2 27 23  GLUCOSE 109* 102*  BUN 13 13  CREATININE 0.79 0.81  CALCIUM 9.9 9.4   Liver Function Tests: Recent Labs  Lab 05/03/18 1058  AST 24  ALT 24  ALKPHOS 57  BILITOT 1.5*  PROT 6.7  ALBUMIN 4.2   Recent Labs  Lab 05/03/18 1058  LIPASE 39   No results for input(s): AMMONIA in the last 168 hours. CBC: Recent Labs  Lab 05/03/18 1058 05/04/18 0514  WBC 6.1 4.6  NEUTROABS 3.4  --   HGB 13.6 12.4  HCT 41.8 38.0  MCV 94.4 92.9  PLT 144* 129*   Cardiac Enzymes: No results for input(s): CKTOTAL, CKMB, CKMBINDEX, TROPONINI in the last 168 hours. BNP: Invalid input(s): POCBNP CBG: Recent Labs  Lab 05/03/18 1005 05/03/18 1626 05/03/18 2053 05/04/18  0746  GLUCAP 135* 173* 188* 126*   D-Dimer No results for input(s): DDIMER in the last 72 hours. Hgb A1c No results for input(s): HGBA1C in the last 72 hours. Lipid Profile No results for input(s): CHOL, HDL, LDLCALC, TRIG, CHOLHDL, LDLDIRECT  in the last 72 hours. Thyroid function studies No results for input(s): TSH, T4TOTAL, T3FREE, THYROIDAB in the last 72 hours.  Invalid input(s): FREET3 Anemia work up No results for input(s): VITAMINB12, FOLATE, FERRITIN, TIBC, IRON, RETICCTPCT in the last 72 hours. Urinalysis    Component Value Date/Time   COLORURINE YELLOW 05/03/2018 1014   APPEARANCEUR CLEAR 05/03/2018 1014   LABSPEC 1.004 (L) 05/03/2018 1014   PHURINE 7.0 05/03/2018 1014   GLUCOSEU 150 (A) 05/03/2018 1014   HGBUR NEGATIVE 05/03/2018 1014   BILIRUBINUR NEGATIVE 05/03/2018 1014   KETONESUR NEGATIVE 05/03/2018 1014   PROTEINUR NEGATIVE 05/03/2018 1014   UROBILINOGEN 0.2 07/31/2012 0044   NITRITE NEGATIVE 05/03/2018 1014   LEUKOCYTESUR NEGATIVE 05/03/2018 1014   Sepsis Labs Invalid input(s): PROCALCITONIN,  WBC,  LACTICIDVEN Microbiology No results found for this or any previous visit (from the past 240 hour(s)).   Patient was seen and examined on the day of discharge and was found to be in stable condition. Time coordinating discharge: 25 minutes including assessment and coordination of care, as well as examination of the patient.   SIGNED:  Noralee Stain, DO [facility] [email or web address] 05/04/2018, 12:05 PM

## 2018-05-04 NOTE — Evaluation (Signed)
Occupational Therapy Evaluation Patient Details Name: [name] MRN: [id number] DOB: Jan 26, 1929 Today's Date: 05/04/2018    History of Present Illness  85 y.o. female  with a PMH of diabetes, hypertension, hyperlipidemia, prior GI bleed who presents with abdominal pain and fall s/p near syncopal eipsode at home.   Clinical Impression   Pt admitted with the above diagnoses and presents with below problem list. PTA pt was independent with ADLs. She reports she is active at baseline, does her own shopping, and occasionally drives. Pt at/near baseline with ADLs. Spouse present during session. No further OT needs indicated at this time. OT signing off.     Follow Up Recommendations  No OT follow up    Equipment Recommendations  None recommended by OT    Recommendations for Other Services       Precautions / Restrictions Precautions Precautions: Fall      Mobility Bed Mobility Overal bed mobility: Independent                Transfers Overall transfer level: Independent                    Balance Overall balance assessment: Needs assistance Sitting-balance support: No upper extremity supported;Feet supported Sitting balance-Leahy Scale: Good Sitting balance - Comments: adjusts socks sitting EOB   Standing balance support: During functional activity Standing balance-Leahy Scale: Good                             ADL either performed or assessed with clinical judgement   ADL Overall ADL's : Needs assistance/impaired Eating/Feeding: Set up;Sitting   Grooming: Brushing hair;Supervision/safety   Upper Body Bathing: Independent   Lower Body Bathing: Supervison/ safety   Upper Body Dressing : Independent   Lower Body Dressing: Supervision/safety   Toilet Transfer: Supervision/safety   Toileting- Clothing Manipulation and Hygiene: Supervision/safety   Tub/ Shower Transfer: Supervision/safety   Functional mobility during ADLs:  Supervision/safety;Independent General ADL Comments: Pt completed in room functional mobility navigating obstacles and turns in the room, toilet transfer, pericare, and grooming tasks standing at sink.     Vision         Perception     Praxis      Pertinent Vitals/Pain       Hand Dominance Right   Extremity/Trunk Assessment Upper Extremity Assessment Upper Extremity Assessment: Overall WFL for tasks assessed;Generalized weakness   Lower Extremity Assessment Lower Extremity Assessment: Defer to PT evaluation       Communication Communication Communication: No difficulties   Cognition Arousal/Alertness: Awake/alert Behavior During Therapy: WFL for tasks assessed/performed Overall Cognitive Status: Within Functional Limits for tasks assessed                                     General Comments  Spouse present    Exercises     Shoulder Instructions      Home Living Family/patient expects to be discharged to:: Private residence Living Arrangements: Spouse/significant other Available Help at Discharge: Family Type of Home: House Home Access: Stairs to enter Secretary/administrator of Steps: 3 Entrance Stairs-Rails: Can reach both Home Layout: One level(except sunroom down 2 steps)     Bathroom Shower/Tub: Tub/shower unit   Bathroom Toilet: Standard(one of each)     Home Equipment: None          Prior Functioning/Environment Level of  Independence: Independent        Comments: active, drives occasionally, does her own grocery shopping        OT Problem List:        OT Treatment/Interventions:      OT Goals(Current goals can be found in the care plan section)    OT Frequency:     Barriers to D/C:            Co-evaluation              AM-PAC OT "6 Clicks" Daily Activity     Outcome Measure Help from another person eating meals?: None Help from another person taking care of personal grooming?: None Help from another  person toileting, which includes using toliet, bedpan, or urinal?: None Help from another person bathing (including washing, rinsing, drying)?: None Help from another person to put on and taking off regular upper body clothing?: None Help from another person to put on and taking off regular lower body clothing?: None 6 Click Score: 24   End of Session    Activity Tolerance: Patient tolerated treatment well Patient left: in chair;with call bell/phone within reach;with family/visitor present  OT Visit Diagnosis: Unsteadiness on feet (R26.81)                Time: 9604-5409: 0904-0912 OT Time Calculation (min): 8 min Charges:  OT General Charges $OT Visit: 1 Visit OT Evaluation $OT Eval Low Complexity: 1 Low  [name], OT Acute Rehabilitation Services Pager: [phone] Office: [phone]   [name], [name] 05/04/2018, 9:30 AM

## 2018-05-04 NOTE — Progress Notes (Signed)
Acknowledging physician's order for PT evaluation and imminent discharge. Patient seen by Physical Therapy yesterday- independent with mobility and no needs identified (please see PT Evaluation Note 05/03/18). Will sign off again. Please reconsult if new needs arise.  Ina Homes, PT, DPT Acute Rehabilitation Services  Pager [phone] Office [phone]

## 2018-05-04 NOTE — Progress Notes (Signed)
Pt is alert and oriented X4. Denies any pain throughout the entire shift. HR in the 40's and 50's and bedtime dose of metoprolol was held last night because of low HR per MD order. Will monitor

## 2018-05-04 NOTE — Progress Notes (Signed)
Pt has orders to be discharged. Discharge instructions given and pt has no additional questions at this time. Medication regimen reviewed and pt educated. Pt verbalized understanding and has no additional questions. Telemetry box removed. IV removed and site in good condition. Pt stable and finishing lunch and then will be ready to discharge home. Patient requested for MD to call daughter, Rochele Raring. Per MD, daughter has not answered phone and voicemail is full.

## 2018-05-11 ENCOUNTER — Encounter (HOSPITAL_COMMUNITY): Payer: Self-pay | Admitting: Nurse Practitioner

## 2018-05-11 ENCOUNTER — Ambulatory Visit (HOSPITAL_COMMUNITY)
Admission: RE | Admit: 2018-05-11 | Discharge: 2018-05-11 | Disposition: A | Discharge status: Home / Self Care | Payer: Medicare Other | Source: Ambulatory Visit | Attending: Nurse Practitioner | Admitting: Nurse Practitioner

## 2018-05-11 VITALS — BP 110/48 | HR 49 | Ht 61.5 in | Wt 136.0 lb

## 2018-05-11 DIAGNOSIS — Z880 Allergy status to penicillin: Secondary | ICD-10-CM | POA: Insufficient documentation

## 2018-05-11 DIAGNOSIS — Z8719 Personal history of other diseases of the digestive system: Secondary | ICD-10-CM | POA: Diagnosis not present

## 2018-05-11 DIAGNOSIS — I480 Paroxysmal atrial fibrillation: Secondary | ICD-10-CM | POA: Diagnosis present

## 2018-05-11 DIAGNOSIS — Z8249 Family history of ischemic heart disease and other diseases of the circulatory system: Secondary | ICD-10-CM | POA: Diagnosis not present

## 2018-05-11 DIAGNOSIS — E7800 Pure hypercholesterolemia, unspecified: Secondary | ICD-10-CM | POA: Diagnosis not present

## 2018-05-11 DIAGNOSIS — Z96642 Presence of left artificial hip joint: Secondary | ICD-10-CM | POA: Insufficient documentation

## 2018-05-11 DIAGNOSIS — Z7901 Long term (current) use of anticoagulants: Secondary | ICD-10-CM | POA: Diagnosis not present

## 2018-05-11 DIAGNOSIS — Z7984 Long term (current) use of oral hypoglycemic drugs: Secondary | ICD-10-CM | POA: Diagnosis not present

## 2018-05-11 DIAGNOSIS — Z79899 Other long term (current) drug therapy: Secondary | ICD-10-CM | POA: Diagnosis not present

## 2018-05-11 DIAGNOSIS — I10 Essential (primary) hypertension: Secondary | ICD-10-CM | POA: Insufficient documentation

## 2018-05-11 DIAGNOSIS — E119 Type 2 diabetes mellitus without complications: Secondary | ICD-10-CM | POA: Diagnosis not present

## 2018-05-11 NOTE — Progress Notes (Signed)
Primary Care Physician: Renaye Rakers, MD Referring Physician: [facility] f/u    [name] is a 85 y.o. female with a h/o recent  hospitalization for diverticula bleed. She was noted to by ED RN to have syncope with HR in the 30's while having profuse rectal bleeding with clots. She also had some SVT on monitor and left with a holter monitor. This did show paroxysmal afib and she is now in the afib clinic to further discuss. She states that her bleeding spontaneously resolved and she was told she would not need a f/u colonoscopy. She is now in need of anticoagulation with new onset afib and with a chadsvasc score of 4. She is in SR today and was asymptomatic with afib on monitor. Her BB was increased when afib was found. She is chronically anemic with plt's around 120.   F/u in afib clinic 10/28. She was started on anticoagulation after hearing back from Dr. Elnoria Howard that it was reasonable to start and so far she is doing well. No signs of bleeding. No awareness of afib.  F/u in afib clinic, 11/19. Monitor reviewed that pt wore back in September that showed mostly sinus rhythm with low afib burden; She is in SR today but with a HR of 41 bpm, she is asymptomatic.No bleeding issues with eliquis, cbc done recently showed normal H/H. She had an echo done today, results pending.  F/u hospitalization in afib clinic 1/29. Pt has hypotension and dizziness and fell backward. She and her husband states that she did not pass out. Her HR is in the upper 40's with reduced BB dose to just once a day. She is not symptomatic with brady. She was in SR during her hospitalization.  Her BP's at home, reviewed today look in range without any low readings. They have not been recording the HR's.    Today, she denies symptoms of palpitations, chest pain, shortness of breath, orthopnea, PND, lower extremity edema, dizziness, presyncope, syncope, or neurologic sequela. The patient is tolerating medications without difficulties and  is otherwise without complaint today.   Past Medical History:  Diagnosis Date  . Diabetes mellitus without complication (HCC)   . Diverticulitis   . Hypercholesterolemia   . Hypertension   . Shingles    Past Surgical History:  Procedure Laterality Date  . ABDOMINAL HYSTERECTOMY    . COLONOSCOPY    . TOTAL HIP ARTHROPLASTY  2005   left sided    Current Outpatient Medications  Medication Sig Dispense Refill  . amLODipine (NORVASC) 10 MG tablet Take 5 mg by mouth daily.  3  . apixaban (ELIQUIS) 2.5 MG TABS tablet Take 1 tablet (2.5 mg total) by mouth 2 (two) times daily. 60 tablet 6  . brimonidine-timolol (COMBIGAN) 0.2-0.5 % ophthalmic solution Place 1 drop into both eyes every 12 (twelve) hours.    . diclofenac sodium (VOLTAREN) 1 % GEL Apply 4 g topically 4 (four) times daily.   2  . dorzolamide (TRUSOPT) 2 % ophthalmic solution Place 1 drop into both eyes 3 (three) times daily.   5  . ferrous sulfate 325 (65 FE) MG tablet Take 1 tablet (325 mg total) by mouth every Monday, Wednesday, and Friday. (Patient taking differently: Take 325 mg by mouth 2 (two) times a week. ) 30 tablet 0  . LUMIGAN 0.01 % SOLN Place 1 drop into both eyes at bedtime.  5  . metoprolol tartrate (LOPRESSOR) 25 MG tablet Take 12.5 mg by mouth daily.    Marland Kitchen  Multiple Vitamins-Minerals (CENTRUM SILVER 50+WOMEN) TABS Take 1 tablet by mouth daily.    . polyethylene glycol (MIRALAX) packet Take 17 g by mouth daily. (Patient taking differently: Take 17 g by mouth daily as needed for mild constipation. ) 14 each 0  . saxagliptin HCl (ONGLYZA) 5 MG TABS tablet Take 5 mg by mouth daily.    . simvastatin (ZOCOR) 20 MG tablet Take 20 mg by mouth daily.     . sitaGLIPtin (JANUVIA) 100 MG tablet Take 100 mg by mouth daily.     No current facility-administered medications for this encounter.     Allergies  Allergen Reactions  . Penicillins Anaphylaxis    Did it involve swelling of the face/tongue/throat, SOB, or low BP?  Yes Did it involve sudden or severe rash/hives, skin peeling, or any reaction on the inside of your mouth or nose? Yes Did you need to seek medical attention at a hospital or doctor's office? Yes When did it last happen? Unknown If all above answers are "NO", may proceed with cephalosporin use.     Social History   Socioeconomic History  . Marital status: Married    Spouse name: Not on file  . Number of children: Not on file  . Years of education: Not on file  . Highest education level: Not on file  Occupational History  . Not on file  Social Needs  . Financial resource strain: Not on file  . Food insecurity:    Worry: Not on file    Inability: Not on file  . Transportation needs:    Medical: Not on file    Non-medical: Not on file  Tobacco Use  . Smoking status: Never Smoker  . Smokeless tobacco: Never Used  Substance and Sexual Activity  . Alcohol use: Yes  . Drug use: No  . Sexual activity: Not on file  Lifestyle  . Physical activity:    Days per week: Not on file    Minutes per session: Not on file  . Stress: Not on file  Relationships  . Social connections:    Talks on phone: Not on file    Gets together: Not on file    Attends religious service: Not on file    Active member of club or organization: Not on file    Attends meetings of clubs or organizations: Not on file    Relationship status: Not on file  . Intimate partner violence:    Fear of current or ex partner: Not on file    Emotionally abused: Not on file    Physically abused: Not on file    Forced sexual activity: Not on file  Other Topics Concern  . Not on file  Social History Narrative  . Not on file    Family History  Problem Relation Age of Onset  . CAD Father     ROS- All systems are reviewed and negative except as per the HPI above  Physical Exam: Vitals:   05/11/18 0934  BP: (!) 110/48  Pulse: (!) 49  Weight: 61.7 kg  Height: 5' 1.5" (1.562 m)   Wt Readings from Last 3  Encounters:  05/11/18 61.7 kg  05/04/18 60 kg  03/01/18 62.1 kg    Labs: Lab Results  Component Value Date   NA 141 05/04/2018   K 3.6 05/04/2018   CL 109 05/04/2018   CO2 23 05/04/2018   GLUCOSE 102 (H) 05/04/2018   BUN 13 05/04/2018   CREATININE 0.81 05/04/2018  CALCIUM 9.4 05/04/2018   PHOS 4.3 03/23/2008   MG 1.8 12/18/2017   Lab Results  Component Value Date   INR 1.04 12/17/2017   No results found for: CHOL, HDL, LDLCALC, TRIG   GEN- The patient is well appearing, alert and oriented x 3 today.   Head- normocephalic, atraumatic Eyes-  Sclera clear, conjunctiva pink Ears- hearing intact Oropharynx- clear Neck- supple, no JVP Lymph- no cervical lymphadenopathy Lungs- Clear to ausculation bilaterally, normal work of breathing Heart- Regular rate and rhythm, no murmurs, rubs or gallops, PMI not laterally displaced GI- soft, NT, ND, + BS Extremities- no clubbing, cyanosis, or edema MS- no significant deformity or atrophy Skin- no rash or lesion Psych- euthymic mood, full affect Neuro- strength and sensation are intact  EKG- Sinus brady at 49 bpm, normal intervals  Epic records reviewed Monitor results: 9/18-Study Highlights   The basic rhythm is normal sinus There are periods of atrial fibrillation with a low overall atrial fib burden There are short runs of pSVT and wide complex tachycardia suspect aberration There are no pathologic pauses > 3 seconds       Assessment and Plan: 1. Paroxymal asymptomatic afib by holter monitor Recent hypotension and fall Pt now asymptomatic/feels well BP's at home stable  In S brady today, but asymptomatic Continue metoprolol 25 mg 1/2 tab q am, dose reduced in the hospital  Offered a 1-2 week monitor but pt declines Continue to follow BP/HR's at home  2. Chadsvasc score of at least 4 Doing well on anticoagulation without signs of bleeding Continue  eliquis 2.5 mg bid  F/u in 3 months   Lupita LeashDonna C. Matthew Folksarroll,  ANP-C [facility] [address] [place], [place] [address] [phone]

## 2018-05-31 ENCOUNTER — Ambulatory Visit (HOSPITAL_COMMUNITY): Payer: Medicare Other | Admitting: Nurse Practitioner

## 2018-07-05 ENCOUNTER — Ambulatory Visit (HOSPITAL_COMMUNITY)
Admission: RE | Admit: 2018-07-05 | Discharge: 2018-07-05 | Disposition: A | Discharge status: Home / Self Care | Payer: Medicare Other | Source: Ambulatory Visit | Attending: Nurse Practitioner | Admitting: Nurse Practitioner

## 2018-07-05 ENCOUNTER — Other Ambulatory Visit: Payer: Self-pay

## 2018-07-05 DIAGNOSIS — I480 Paroxysmal atrial fibrillation: Secondary | ICD-10-CM | POA: Diagnosis not present

## 2018-07-05 NOTE — Progress Notes (Signed)
Electrophysiology TeleHealth Note   Due to national recommendations of social distancing due to COVID 19, Phone telehealth visit is felt to be most appropriate for this patient at this time.  See consent below  from today for patient consent regarding telehealth for the Atrial Fibrillation Clinic.    Date:  07/05/2018   ID:  [name], DOB 01-05-1929, MRN [id number]  Location: home Provider location: [address] [place], [place] [address] Evaluation Performed: Follow up  PCP:  Renaye Rakers, MD  Primary Cardiologist:  none Primary Electrophysiologist: none  CC: afib    History of Present Illness: [name] is a 85 y.o. female who presents via audio/video conferencing for a telehealth visit today.  The patient is being contacted for f/u of afib/ prior brady/syncope. She reports no change in medication. HR this am 55 bpm, BP 122/74. She has not seen any HR's in the 40's.   She reports that she is felling very well. No sensation of tachyarrhythmia. No lightheadedness or presyncope. No issues with blood thinner/bleeding.No change in medications.  Today, she denies symptoms of palpitations, chest pain, shortness of breath, orthopnea, PND, lower extremity edema, claudication, dizziness, presyncope, syncope, bleeding, or neurologic sequela. The patient is tolerating medications without difficulties and is otherwise without complaint today.   she denies symptoms of cough, fevers, chills, or new SOB worrisome for COVID 19.    Atrial Fibrillation Risk Factors:  she does not  have symptoms or diagnosis of sleep apnea. she does not have a history of rheumatic fever. she does not have a history of alcohol use. The patient does not have a history of early familial atrial fibrillation or other arrhythmias.  she has a BMI of There is no height or weight on file to calculate BMI.. There were no vitals filed for this visit.  Past Medical History:  Diagnosis Date  . Diabetes  mellitus without complication (HCC)   . Diverticulitis   . Hypercholesterolemia   . Hypertension   . Shingles    Past Surgical History:  Procedure Laterality Date  . ABDOMINAL HYSTERECTOMY    . COLONOSCOPY    . TOTAL HIP ARTHROPLASTY  2005   left sided     Current Outpatient Medications  Medication Sig Dispense Refill  . amLODipine (NORVASC) 10 MG tablet Take 5 mg by mouth daily.  3  . apixaban (ELIQUIS) 2.5 MG TABS tablet Take 1 tablet (2.5 mg total) by mouth 2 (two) times daily. 60 tablet 6  . brimonidine-timolol (COMBIGAN) 0.2-0.5 % ophthalmic solution Place 1 drop into both eyes every 12 (twelve) hours.    . diclofenac sodium (VOLTAREN) 1 % GEL Apply 4 g topically 4 (four) times daily.   2  . dorzolamide (TRUSOPT) 2 % ophthalmic solution Place 1 drop into both eyes 3 (three) times daily.   5  . ferrous sulfate 325 (65 FE) MG tablet Take 1 tablet (325 mg total) by mouth every Monday, Wednesday, and Friday. (Patient taking differently: Take 325 mg by mouth 2 (two) times a week. ) 30 tablet 0  . LUMIGAN 0.01 % SOLN Place 1 drop into both eyes at bedtime.  5  . metoprolol tartrate (LOPRESSOR) 25 MG tablet Take 12.5 mg by mouth daily.    . Multiple Vitamins-Minerals (CENTRUM SILVER 50+WOMEN) TABS Take 1 tablet by mouth daily.    . polyethylene glycol (MIRALAX) packet Take 17 g by mouth daily. (Patient taking differently: Take 17 g by mouth daily as needed  for mild constipation. ) 14 each 0  . saxagliptin HCl (ONGLYZA) 5 MG TABS tablet Take 5 mg by mouth daily.    . simvastatin (ZOCOR) 20 MG tablet Take 20 mg by mouth daily.     . sitaGLIPtin (JANUVIA) 100 MG tablet Take 100 mg by mouth daily.     No current facility-administered medications for this encounter.     Allergies:   Penicillins   Social History:  The patient  reports that she has never smoked. She has never used smokeless tobacco. She reports current alcohol use. She reports that she does not use drugs.   Family  History:  The patient's  family history includes CAD in her father.    ROS:  Please see the history of present illness.   All other systems are personally reviewed and negative.   Exam: N/A  Recent Labs: 12/18/2017: Magnesium 1.8; TSH 0.444 05/03/2018: ALT 24 05/04/2018: BUN 13; Creatinine, Ser 0.81; Hemoglobin 12.4; Platelets 129; Potassium 3.6; Sodium 141  personally reviewed    Other studies personally reviewed: Epic labs reviewed Prior notes    ASSESSMENT AND PLAN:  1.  Paroxysmal  atrial fibrillation Pt is currently doing well without any awareness of afib  2. Huston Foley Currently no symptomatic brady with HR's in the 50's  3. Syncope No new symptoms  4. CHA2DS2VASc score of 5 Continue  eliquis 2.5 mg bid    COVID screen The patient does not have any symptoms that suggest any further testing/ screening at this time.  Social distancing reinforced today.  States she and her husband are being very careful.  Follow-up: 4 months  Current medicines are reviewed at length with the patient today.   The patient does not have concerns regarding her medicines.  The following changes were made today:  none  Labs/ tests ordered today include: none No orders of the defined types were placed in this encounter.   Patient Risk:  after full review of this patients clinical status, I feel that they are at moderate risk at this time.   Today, I have spent 5 minutes with the patient with telehealth technology discussing  Above.    Signed, Rudi Coco, NP 07/05/2018 9:48 AM  Afib Clinic [facility] [address] [place], [place] [address] [phone]   I hereby voluntarily request, consent and authorize the Atrial Fibrillation Clinic and its employed or contracted physicians, physician assistants, nurse practitioners or other licensed health care professionals (the Practitioner), to provide me with telemedicine health care services (the "Services") as deemed  necessary by the treating Practitioner. I acknowledge and consent to receive the Services by the Practitioner via telemedicine. I understand that the telemedicine visit will involve communicating with the Practitioner through live audiovisual communication technology and the disclosure of certain medical information by electronic transmission. I acknowledge that I have been given the opportunity to request an in-person assessment or other available alternative prior to the telemedicine visit and am voluntarily participating in the telemedicine visit.   I understand that I have the right to withhold or withdraw my consent to the use of telemedicine in the course of my care at any time, without affecting my right to future care or treatment, and that the Practitioner or I may terminate the telemedicine visit at any time. I understand that I have the right to inspect all information obtained and/or recorded in the course of the telemedicine visit and may receive copies of available information for a reasonable fee.  I  understand that some of the potential risks of receiving the Services via telemedicine include:   Delay or interruption in medical evaluation due to technological equipment failure or disruption;  Information transmitted may not be sufficient (e.g. poor resolution of images) to allow for appropriate medical decision making by the Practitioner; and/or  In rare instances, security protocols could fail, causing a breach of personal health information.   Furthermore, I acknowledge that it is my responsibility to provide information about my medical history, conditions and care that is complete and accurate to the best of my ability. I acknowledge that Practitioner's advice, recommendations, and/or decision may be based on factors not within their control, such as incomplete or inaccurate data provided by me or distortions of diagnostic images or specimens that may result from electronic transmissions.  I understand that the practice of medicine is not an exact science and that Practitioner makes no warranties or guarantees regarding treatment outcomes. I acknowledge that I will receive a copy of this consent concurrently upon execution via email to the email address I last provided but may also request a printed copy by calling the office of the Atrial Fibrillation Clinic.  I understand that my insurance will be billed for this visit.   I have read or had this consent read to me.  I understand the contents of this consent, which adequately explains the benefits and risks of the Services being provided via telemedicine.  I have been provided ample opportunity to ask questions regarding this consent and the Services and have had my questions answered to my satisfaction.  I give my informed consent for the services to be provided through the use of telemedicine in my medical care  By participating in this telemedicine visit I agree to the above.

## 2018-08-17 ENCOUNTER — Telehealth (HOSPITAL_COMMUNITY): Payer: Self-pay | Admitting: *Deleted

## 2018-08-17 NOTE — Telephone Encounter (Signed)
Pt called in stating for the last 3 weeks she has had increased urination and urgency. She has tried azo over the counter without improvement. She denies fever or chills. She states she is off all her diabetes medications now per her primary a few  Months back - she has not checked her glucose since stopping the medication - She does endorse eating a lot of fruit.  instructed pt to contact PCP to have urine checked for glucose and for infection as either could be cause of her frequent urination. Pt verbalized understanding and will call her PCP now.

## 2018-10-01 ENCOUNTER — Other Ambulatory Visit (HOSPITAL_COMMUNITY): Payer: Self-pay | Admitting: Nurse Practitioner

## 2018-11-03 ENCOUNTER — Other Ambulatory Visit: Payer: Self-pay

## 2018-11-03 ENCOUNTER — Ambulatory Visit (HOSPITAL_COMMUNITY)
Admission: RE | Admit: 2018-11-03 | Discharge: 2018-11-03 | Disposition: A | Discharge status: Home / Self Care | Payer: Medicare Other | Source: Ambulatory Visit | Attending: Nurse Practitioner | Admitting: Nurse Practitioner

## 2018-11-03 DIAGNOSIS — I480 Paroxysmal atrial fibrillation: Secondary | ICD-10-CM

## 2018-11-03 NOTE — Progress Notes (Signed)
Electrophysiology TeleHealth Note   Due to national recommendations of social distancing due to COVID 19, Audio/video  telehealth visit is felt to be most appropriate for this patient at this time.  See MyChart message/consent below  from today for patient consent regarding telehealth for the Atrial Fibrillation Clinic.    Date:  11/03/2018   ID:  Hannah HuhCorine M [name], DOB [birth year], MRN [id number]  Location: home  Provider location: [address] [place], [place] [address] Evaluation Performed: Follow up  PCP:  Renaye RakersBland, Veita, MD  Primary Cardiologist:   None  Primary Electrophysiologist: Afib clinic   CC afib   History of Present Illness: [name] is a 85 y.o. female who presents via audio/video conferencing for a telehealth visit today. The patient has history of paroxysmal afib as welll  Brady/syncope in the past.   She still runs a low heart rate in the upper 40's but is not symptomatic. Denies any lightheadedness, falls or further syncope. No awareness of afib. She states that she is doing great.  Today, she denies symptoms of palpitations, chest pain, shortness of breath, orthopnea, PND, lower extremity edema, claudication, dizziness, presyncope, syncope, bleeding, or neurologic sequela. The patient is tolerating medications without difficulties and is otherwise without complaint today.   she denies symptoms of cough, fevers, chills, or new SOB worrisome for COVID 19. BP today 130/68 with HR of 49 bpm   Atrial Fibrillation Risk Factors:  she does not have symptoms or diagnosis of sleep apnea. she does not have a history of rheumatic fever. she does not have a history of alcohol use. The patient does not have a history of early familial atrial fibrillation or other arrhythmias.  she has a BMI of There is no height or weight on file to calculate BMI.. There were no vitals filed for this visit.  Past Medical History:  Diagnosis Date  . Diabetes mellitus without  complication (HCC)   . Diverticulitis   . Hypercholesterolemia   . Hypertension   . Shingles    Past Surgical History:  Procedure Laterality Date  . ABDOMINAL HYSTERECTOMY    . COLONOSCOPY    . TOTAL HIP ARTHROPLASTY  2005   left sided     Current Outpatient Medications  Medication Sig Dispense Refill  . amLODipine (NORVASC) 10 MG tablet Take 5 mg by mouth daily.  3  . brimonidine-timolol (COMBIGAN) 0.2-0.5 % ophthalmic solution Place 1 drop into both eyes every 12 (twelve) hours.    . diclofenac sodium (VOLTAREN) 1 % GEL Apply 4 g topically 4 (four) times daily.   2  . dorzolamide (TRUSOPT) 2 % ophthalmic solution Place 1 drop into both eyes 3 (three) times daily.   5  . ELIQUIS 2.5 MG TABS tablet TAKE 1 TABLET BY MOUTH TWICE DAILY 60 tablet 6  . ferrous sulfate 325 (65 FE) MG tablet Take 1 tablet (325 mg total) by mouth every Monday, Wednesday, and Friday. (Patient taking differently: Take 325 mg by mouth 2 (two) times a week. ) 30 tablet 0  . LUMIGAN 0.01 % SOLN Place 1 drop into both eyes at bedtime.  5  . metoprolol tartrate (LOPRESSOR) 25 MG tablet Take 12.5 mg by mouth daily.    . Multiple Vitamins-Minerals (CENTRUM SILVER 50+WOMEN) TABS Take 1 tablet by mouth daily.    . polyethylene glycol (MIRALAX) packet Take 17 g by mouth daily. (Patient taking differently: Take 17 g by mouth daily as needed for mild constipation. ) 14  each 0  . saxagliptin HCl (ONGLYZA) 5 MG TABS tablet Take 5 mg by mouth daily.    . simvastatin (ZOCOR) 20 MG tablet Take 20 mg by mouth daily.     . sitaGLIPtin (JANUVIA) 100 MG tablet Take 100 mg by mouth daily.     No current facility-administered medications for this encounter.     Allergies:   Penicillins   Social History:  The patient  reports that she has never smoked. She has never used smokeless tobacco. She reports current alcohol use. She reports that she does not use drugs.   Family History:  The patient's  family history includes CAD in  her father.    ROS:  Please see the history of present illness.   All other systems are personally reviewed and negative.   Exam: NA- phone only visit  Recent Labs: 12/18/2017: Magnesium 1.8; TSH 0.444 05/03/2018: ALT 24 05/04/2018: BUN 13; Creatinine, Ser 0.81; Hemoglobin 12.4; Platelets 129; Potassium 3.6; Sodium 141  personally reviewed    Other studies personally reviewed: Additional studies/ records that were reviewed today include:none      ASSESSMENT AND PLAN:  1. Paroxysmal  atrial fibrillation Quiet  Continue metoprolol 12.6 mg bid  2. Bradycardia Currently asymptomatic  No changes No further syncope  If becomes symptomatic, BB will need to be stopped   This patients CHA2DS2-VASc Score and unadjusted Ischemic Stroke Rate (% per year) is equal to 7.2 % stroke rate/year from a score of 5  Above score calculated as 1 point each if present [CHF, HTN, DM, Vascular=MI/PAD/Aortic Plaque, Age if 65-74, or Female] Above score calculated as 2 points each if present [Age > 75, or Stroke/TIA/TE]    COVID screen The patient does not have any symptoms that suggest any further testing/ screening at this time.  Social distancing reinforced today.    Follow-up:  6 months  Current medicines are reviewed at length with the patient today.   The patient does not have concerns regarding her medicines.  The following changes were made today:  none  Labs/ tests ordered today include: none  No orders of the defined types were placed in this encounter.   Patient Risk:  after full review of this patients clinical status, I feel that they are at  Mod to high risk at this time.   Today, I have spent 5-10 minutes with the patient with telehealth technology discussing  above    Signed, Roderic Palau NP  11/03/2018 11:35 AM  Afib [facility] [address] [place], [place] [address] [phone]

## 2018-12-05 ENCOUNTER — Other Ambulatory Visit: Payer: Self-pay | Admitting: Family Medicine

## 2018-12-05 ENCOUNTER — Other Ambulatory Visit: Payer: Self-pay

## 2018-12-05 ENCOUNTER — Ambulatory Visit
Admission: RE | Admit: 2018-12-05 | Discharge: 2018-12-05 | Disposition: A | Discharge status: Home / Self Care | Payer: Medicare Other | Source: Ambulatory Visit | Attending: Family Medicine | Admitting: Family Medicine

## 2018-12-05 DIAGNOSIS — W182XXA Fall in (into) shower or empty bathtub, initial encounter: Secondary | ICD-10-CM

## 2019-02-28 ENCOUNTER — Other Ambulatory Visit (HOSPITAL_COMMUNITY): Payer: Self-pay | Admitting: *Deleted

## 2019-02-28 MED ORDER — METOPROLOL TARTRATE 25 MG PO TABS: 12.5000 mg | ORAL_TABLET | Freq: Every day | ORAL | 1 refills | Status: DC

## 2019-04-19 DIAGNOSIS — M1712 Unilateral primary osteoarthritis, left knee: Secondary | ICD-10-CM | POA: Diagnosis not present

## 2019-05-02 DIAGNOSIS — H5712 Ocular pain, left eye: Secondary | ICD-10-CM | POA: Diagnosis not present

## 2019-05-02 DIAGNOSIS — H209 Unspecified iridocyclitis: Secondary | ICD-10-CM | POA: Diagnosis not present

## 2019-05-02 DIAGNOSIS — H401133 Primary open-angle glaucoma, bilateral, severe stage: Secondary | ICD-10-CM | POA: Diagnosis not present

## 2019-05-02 DIAGNOSIS — E119 Type 2 diabetes mellitus without complications: Secondary | ICD-10-CM | POA: Diagnosis not present

## 2019-05-04 DIAGNOSIS — N3281 Overactive bladder: Secondary | ICD-10-CM | POA: Diagnosis not present

## 2019-05-04 DIAGNOSIS — E782 Mixed hyperlipidemia: Secondary | ICD-10-CM | POA: Diagnosis not present

## 2019-05-04 DIAGNOSIS — R7309 Other abnormal glucose: Secondary | ICD-10-CM | POA: Diagnosis not present

## 2019-05-04 DIAGNOSIS — I10 Essential (primary) hypertension: Secondary | ICD-10-CM | POA: Diagnosis not present

## 2019-05-10 ENCOUNTER — Other Ambulatory Visit (HOSPITAL_COMMUNITY): Payer: Self-pay | Admitting: Nurse Practitioner

## 2019-06-06 DIAGNOSIS — I10 Essential (primary) hypertension: Secondary | ICD-10-CM | POA: Diagnosis not present

## 2019-06-06 DIAGNOSIS — E782 Mixed hyperlipidemia: Secondary | ICD-10-CM | POA: Diagnosis not present

## 2019-06-06 DIAGNOSIS — N3281 Overactive bladder: Secondary | ICD-10-CM | POA: Diagnosis not present

## 2019-06-06 DIAGNOSIS — I482 Chronic atrial fibrillation, unspecified: Secondary | ICD-10-CM | POA: Diagnosis not present

## 2019-06-08 DIAGNOSIS — H401133 Primary open-angle glaucoma, bilateral, severe stage: Secondary | ICD-10-CM | POA: Diagnosis not present

## 2019-06-08 DIAGNOSIS — E119 Type 2 diabetes mellitus without complications: Secondary | ICD-10-CM | POA: Diagnosis not present

## 2019-06-08 DIAGNOSIS — Z888 Allergy status to other drugs, medicaments and biological substances status: Secondary | ICD-10-CM | POA: Diagnosis not present

## 2019-06-08 DIAGNOSIS — H209 Unspecified iridocyclitis: Secondary | ICD-10-CM | POA: Diagnosis not present

## 2019-06-08 DIAGNOSIS — H5712 Ocular pain, left eye: Secondary | ICD-10-CM | POA: Diagnosis not present

## 2019-06-09 ENCOUNTER — Other Ambulatory Visit (HOSPITAL_COMMUNITY): Payer: Self-pay | Admitting: *Deleted

## 2019-06-09 MED ORDER — APIXABAN 2.5 MG PO TABS: 2.5000 mg | ORAL_TABLET | Freq: Two times a day (BID) | ORAL | 3 refills | Status: DC

## 2019-06-13 ENCOUNTER — Ambulatory Visit (HOSPITAL_COMMUNITY)
Admission: RE | Admit: 2019-06-13 | Discharge: 2019-06-13 | Disposition: A | Discharge status: Home / Self Care | Payer: Medicare PPO | Source: Ambulatory Visit | Attending: Nurse Practitioner | Admitting: Nurse Practitioner

## 2019-06-13 ENCOUNTER — Encounter (HOSPITAL_COMMUNITY): Payer: Self-pay | Admitting: Nurse Practitioner

## 2019-06-13 ENCOUNTER — Other Ambulatory Visit: Payer: Self-pay

## 2019-06-13 VITALS — BP 106/68 | HR 101 | Ht 61.5 in | Wt 140.2 lb

## 2019-06-13 DIAGNOSIS — Z79899 Other long term (current) drug therapy: Secondary | ICD-10-CM | POA: Insufficient documentation

## 2019-06-13 DIAGNOSIS — I10 Essential (primary) hypertension: Secondary | ICD-10-CM | POA: Diagnosis not present

## 2019-06-13 DIAGNOSIS — E7800 Pure hypercholesterolemia, unspecified: Secondary | ICD-10-CM | POA: Diagnosis not present

## 2019-06-13 DIAGNOSIS — I4892 Unspecified atrial flutter: Secondary | ICD-10-CM | POA: Insufficient documentation

## 2019-06-13 DIAGNOSIS — I480 Paroxysmal atrial fibrillation: Secondary | ICD-10-CM

## 2019-06-13 DIAGNOSIS — Z7901 Long term (current) use of anticoagulants: Secondary | ICD-10-CM | POA: Diagnosis not present

## 2019-06-13 DIAGNOSIS — D6869 Other thrombophilia: Secondary | ICD-10-CM | POA: Diagnosis not present

## 2019-06-13 DIAGNOSIS — E119 Type 2 diabetes mellitus without complications: Secondary | ICD-10-CM | POA: Diagnosis not present

## 2019-06-13 MED ORDER — METOPROLOL TARTRATE 25 MG PO TABS: 12.5000 mg | ORAL_TABLET | Freq: Two times a day (BID) | ORAL | 1 refills | Status: DC

## 2019-06-13 NOTE — Progress Notes (Signed)
Primary Care Physician: Renaye Rakers, MD Referring Physician: [facility] f/u    [name] is a 86 y.o. female with a h/o recent  hospitalization for diverticula bleed. She was noted to by ED RN to have syncope with HR in the 30's while having profuse rectal bleeding with clots. She also had some SVT on monitor and left with a holter monitor. This did show paroxysmal afib and she is now in the afib clinic to further discuss. She states that her bleeding spontaneously resolved and she was told she would not need a f/u colonoscopy. She is now in need of anticoagulation with new onset afib and with a chadsvasc score of 4. She is in SR today and was asymptomatic with afib on monitor. Her BB was increased when afib was found. She is chronically anemic with plt's around 120.   F/u in afib clinic 10/28. She was started on anticoagulation after hearing back from Dr. Elnoria Howard that it was reasonable to start and so far she is doing well. No signs of bleeding. No awareness of afib.  F/u in afib clinic, 11/19. Monitor reviewed that pt wore back in September that showed mostly sinus rhythm with low afib burden; She is in SR today but with a HR of 41 bpm, she is asymptomatic.No bleeding issues with eliquis, cbc done recently showed normal H/H. She had an echo done today, results pending.  F/u hospitalization in afib clinic 1/29. Pt has hypotension and dizziness and fell backward. She and her husband states that she did not pass out. Her HR is in the upper 40's with reduced BB dose to just once a day. She is not symptomatic with brady. She was in SR during her hospitalization.  Her BP's at home, reviewed today look in range without any low readings. They have not been recording the HR's.    F/u in afib clinic 06/13/19. She feels great but her EKG looks like an  atrial flutter with v rates around 100 bpm. She is asymptomatic. She does not think that she is on BB, for some reason it is not on her med list, but pharmacy  reports that she did pick up lopressor the end of January. It was a 90 day supply.   Today, she denies symptoms of palpitations, chest pain, shortness of breath, orthopnea, PND, lower extremity edema, dizziness, presyncope, syncope, or neurologic sequela. The patient is tolerating medications without difficulties and is otherwise without complaint today.   Past Medical History:  Diagnosis Date  . Diabetes mellitus without complication (HCC)   . Diverticulitis   . Hypercholesterolemia   . Hypertension   . Shingles    Past Surgical History:  Procedure Laterality Date  . ABDOMINAL HYSTERECTOMY    . COLONOSCOPY    . TOTAL HIP ARTHROPLASTY  2005   left sided    Current Outpatient Medications  Medication Sig Dispense Refill  . amLODipine (NORVASC) 10 MG tablet Take by mouth daily.   3  . apixaban (ELIQUIS) 2.5 MG TABS tablet Take 1 tablet (2.5 mg total) by mouth 2 (two) times daily. 60 tablet 3  . brimonidine-timolol (COMBIGAN) 0.2-0.5 % ophthalmic solution Place 1 drop into both eyes every 12 (twelve) hours.    . dorzolamide (TRUSOPT) 2 % ophthalmic solution Place 1 drop into both eyes 3 (three) times daily.   5  . ferrous sulfate 325 (65 FE) MG tablet Take 1 tablet (325 mg total) by mouth every Monday, Wednesday, and Friday. (Patient taking differently: Take  325 mg by mouth 2 (two) times a week. ) 30 tablet 0  . FML FORTE 0.25 % ophthalmic suspension     . influenza vaccine adjuvanted (FLUAD QUADRIVALENT) 0.5 ML injection Fluad Quad 2020-2021(54yr up)(PF) 60 mcg (15 mcg x 4)/0.59mL IM syringe  ADM 0.5ML IM UTD    . ketorolac (ACULAR) 0.5 % ophthalmic solution     . LUMIGAN 0.01 % SOLN Place 1 drop into both eyes at bedtime.  5  . Multiple Vitamins-Minerals (CENTRUM SILVER 50+WOMEN) TABS Take 1 tablet by mouth daily.    Marland Kitchen MYRBETRIQ 50 MG TB24 tablet Take by mouth daily.     . simvastatin (ZOCOR) 20 MG tablet Take 20 mg by mouth daily.      No current facility-administered medications  for this encounter.    Allergies  Allergen Reactions  . Penicillins Anaphylaxis    Did it involve swelling of the face/tongue/throat, SOB, or low BP? Yes Did it involve sudden or severe rash/hives, skin peeling, or any reaction on the inside of your mouth or nose? Yes Did you need to seek medical attention at a hospital or doctor's office? Yes When did it last happen? Unknown If all above answers are "NO", may proceed with cephalosporin use.     Social History   Socioeconomic History  . Marital status: Married    Spouse name: Not on file  . Number of children: Not on file  . Years of education: Not on file  . Highest education level: Not on file  Occupational History  . Not on file  Tobacco Use  . Smoking status: Never Smoker  . Smokeless tobacco: Never Used  Substance and Sexual Activity  . Alcohol use: Yes  . Drug use: No  . Sexual activity: Not on file  Other Topics Concern  . Not on file  Social History Narrative  . Not on file   Social Determinants of Health   Financial Resource Strain:   . Difficulty of Paying Living Expenses: Not on file  Food Insecurity:   . Worried About Charity fundraiser in the Last Year: Not on file  . Ran Out of Food in the Last Year: Not on file  Transportation Needs:   . Lack of Transportation (Medical): Not on file  . Lack of Transportation (Non-Medical): Not on file  Physical Activity:   . Days of Exercise per Week: Not on file  . Minutes of Exercise per Session: Not on file  Stress:   . Feeling of Stress : Not on file  Social Connections:   . Frequency of Communication with Friends and Family: Not on file  . Frequency of Social Gatherings with Friends and Family: Not on file  . Attends Religious Services: Not on file  . Active Member of Clubs or Organizations: Not on file  . Attends Archivist Meetings: Not on file  . Marital Status: Not on file  Intimate Partner Violence:   . Fear of Current or Ex-Partner: Not  on file  . Emotionally Abused: Not on file  . Physically Abused: Not on file  . Sexually Abused: Not on file    Family History  Problem Relation Age of Onset  . CAD Father     ROS- All systems are reviewed and negative except as per the HPI above  Physical Exam: Vitals:   06/13/19 1033  BP: 106/68  Pulse: (!) 101  Weight: 63.6 kg  Height: 5' 1.5" (1.562 m)   Wt Readings from  Last 3 Encounters:  06/13/19 63.6 kg  05/11/18 61.7 kg  05/04/18 60 kg    Labs: Lab Results  Component Value Date   NA 141 05/04/2018   K 3.6 05/04/2018   CL 109 05/04/2018   CO2 23 05/04/2018   GLUCOSE 102 (H) 05/04/2018   BUN 13 05/04/2018   CREATININE 0.81 05/04/2018   CALCIUM 9.4 05/04/2018   PHOS 4.3 03/23/2008   MG 1.8 12/18/2017   Lab Results  Component Value Date   INR 1.04 12/17/2017   No results found for: CHOL, HDL, LDLCALC, TRIG   GEN- The patient is well appearing, alert and oriented x 3 today.   Head- normocephalic, atraumatic Eyes-  Sclera clear, conjunctiva pink Ears- hearing intact Oropharynx- clear Neck- supple, no JVP Lymph- no cervical lymphadenopathy Lungs- Clear to ausculation bilaterally, normal work of breathing Heart- Regular rate and rhythm, no murmurs, rubs or gallops, PMI not laterally displaced GI- soft, NT, ND, + BS Extremities- no clubbing, cyanosis, or edema MS- no significant deformity or atrophy Skin- no rash or lesion Psych- euthymic mood, full affect Neuro- strength and sensation are intact  EKG- Sinus brady at 49 bpm, normal intervals  Epic records reviewed Monitor results: 9/18-Study Highlights   The basic rhythm is normal sinus There are periods of atrial fibrillation with a low overall atrial fib burden There are short runs of pSVT and wide complex tachycardia suspect aberration There are no pathologic pauses > 3 seconds       Assessment and Plan: 1. Asymptomatic atrial flutter  Pt unsure if she is on BB Pharmacy reports she  did have it refilled end of January She feels well She will go home and call back to office and review her meds with [place] Then I will make decisions going forward.  2. Chadsvasc score of at least 4 Doing well on anticoagulation without signs of bleeding Continue  eliquis 2.5 mg bid  F/u after review of meds and any changes that need to be made    Lupita Leash C. [facility] [address] [place], [place] [address] [phone]

## 2019-06-13 NOTE — Addendum Note (Signed)
Encounter addended by: Shona Simpson, RN on: 06/13/2019 2:51 PM  Actions taken: Order list changed

## 2019-06-22 ENCOUNTER — Encounter (HOSPITAL_COMMUNITY): Payer: Self-pay | Admitting: Nurse Practitioner

## 2019-06-22 ENCOUNTER — Other Ambulatory Visit: Payer: Self-pay

## 2019-06-22 ENCOUNTER — Ambulatory Visit (HOSPITAL_COMMUNITY)
Admission: RE | Admit: 2019-06-22 | Discharge: 2019-06-22 | Disposition: A | Discharge status: Home / Self Care | Payer: Medicare PPO | Source: Ambulatory Visit | Attending: Nurse Practitioner | Admitting: Nurse Practitioner

## 2019-06-22 VITALS — BP 118/62 | HR 78 | Ht 61.5 in | Wt 139.4 lb

## 2019-06-22 DIAGNOSIS — Z8249 Family history of ischemic heart disease and other diseases of the circulatory system: Secondary | ICD-10-CM | POA: Insufficient documentation

## 2019-06-22 DIAGNOSIS — Z880 Allergy status to penicillin: Secondary | ICD-10-CM | POA: Insufficient documentation

## 2019-06-22 DIAGNOSIS — I484 Atypical atrial flutter: Secondary | ICD-10-CM

## 2019-06-22 DIAGNOSIS — Z8719 Personal history of other diseases of the digestive system: Secondary | ICD-10-CM | POA: Insufficient documentation

## 2019-06-22 DIAGNOSIS — I10 Essential (primary) hypertension: Secondary | ICD-10-CM | POA: Diagnosis not present

## 2019-06-22 DIAGNOSIS — I4892 Unspecified atrial flutter: Secondary | ICD-10-CM | POA: Diagnosis not present

## 2019-06-22 DIAGNOSIS — Z96642 Presence of left artificial hip joint: Secondary | ICD-10-CM | POA: Insufficient documentation

## 2019-06-22 DIAGNOSIS — E119 Type 2 diabetes mellitus without complications: Secondary | ICD-10-CM | POA: Insufficient documentation

## 2019-06-22 DIAGNOSIS — I4891 Unspecified atrial fibrillation: Secondary | ICD-10-CM | POA: Diagnosis present

## 2019-06-22 DIAGNOSIS — Z79899 Other long term (current) drug therapy: Secondary | ICD-10-CM | POA: Insufficient documentation

## 2019-06-22 DIAGNOSIS — E7800 Pure hypercholesterolemia, unspecified: Secondary | ICD-10-CM | POA: Diagnosis not present

## 2019-06-22 DIAGNOSIS — D6869 Other thrombophilia: Secondary | ICD-10-CM | POA: Diagnosis not present

## 2019-06-22 DIAGNOSIS — Z7901 Long term (current) use of anticoagulants: Secondary | ICD-10-CM | POA: Diagnosis not present

## 2019-06-22 MED ORDER — FUROSEMIDE 20 MG PO TABS: 10.0000 mg | ORAL_TABLET | ORAL | 1 refills | Status: DC

## 2019-06-22 NOTE — Progress Notes (Signed)
Primary Care Physician: Renaye Rakers, MD Referring Physician: [facility] f/u    [name] is a 86 y.o. female with a h/o recent  hospitalization for diverticula bleed. She was noted to by ED RN to have syncope with HR in the 30's while having profuse rectal bleeding with clots. She also had some SVT on monitor and left with a holter monitor. This did show paroxysmal afib and she is now in the afib clinic to further discuss. She states that her bleeding spontaneously resolved and she was told she would not need a f/u colonoscopy. She is now in need of anticoagulation with new onset afib and with a chadsvasc score of 4. She is in SR today and was asymptomatic with afib on monitor. Her BB was increased when afib was found. She is chronically anemic with plt's around 120.   F/u in afib clinic 10/28. She was started on anticoagulation after hearing back from Dr. Elnoria Howard that it was reasonable to start and so far she is doing well. No signs of bleeding. No awareness of afib.  F/u in afib clinic, 11/19. Monitor reviewed that pt wore back in September that showed mostly sinus rhythm with low afib burden; She is in SR today but with a HR of 41 bpm, she is asymptomatic.No bleeding issues with eliquis, cbc done recently showed normal H/H. She had an echo done today, results pending.  F/u hospitalization in afib clinic 1/29. Pt has hypotension and dizziness and fell backward. She and her husband states that she did not pass out. Her HR is in the upper 40's with reduced BB dose to just once a day. She is not symptomatic with brady. She was in SR during her hospitalization.  Her BP's at home, reviewed today look in range without any low readings. They have not been recording the HR's.    F/u in afib clinic 06/13/19. She feels great but her EKG looks like an  atrial flutter with v rates around 100 bpm. She is asymptomatic. She does not think that she is on BB, for some reason it is not on her med list, but pharmacy  reports that she did pick up lopressor the end of January. It was a 90 day supply.   F/u in afib clinic, 06/22/19. She remains in atrial flutter but rate controlled now. She does confirm that she is taking BB. She  is asymptomatic other than noting some pedal edema. Her weight may be up 1-2 lbs. She  has had both covid shots now in the last month.  Today, she denies symptoms of palpitations, chest pain, shortness of breath, orthopnea, PND, mild lower extremity edema, dizziness, presyncope, syncope, or neurologic sequela. The patient is tolerating medications without difficulties and is otherwise without complaint today.   Past Medical History:  Diagnosis Date  . Diabetes mellitus without complication (HCC)   . Diverticulitis   . Hypercholesterolemia   . Hypertension   . Shingles    Past Surgical History:  Procedure Laterality Date  . ABDOMINAL HYSTERECTOMY    . COLONOSCOPY    . TOTAL HIP ARTHROPLASTY  2005   left sided    Current Outpatient Medications  Medication Sig Dispense Refill  . amLODipine (NORVASC) 10 MG tablet Take by mouth daily.   3  . apixaban (ELIQUIS) 2.5 MG TABS tablet Take 1 tablet (2.5 mg total) by mouth 2 (two) times daily. 60 tablet 3  . brimonidine-timolol (COMBIGAN) 0.2-0.5 % ophthalmic solution Place 1 drop into both eyes  every 12 (twelve) hours.    . ferrous sulfate 325 (65 FE) MG tablet Take 1 tablet (325 mg total) by mouth every Monday, Wednesday, and Friday. (Patient taking differently: Take 325 mg by mouth 2 (two) times a week. ) 30 tablet 0  . influenza vaccine adjuvanted (FLUAD QUADRIVALENT) 0.5 ML injection Fluad Quad 2020-2021(34yr up)(PF) 60 mcg (15 mcg x 4)/0.38mL IM syringe  ADM 0.5ML IM UTD    . LUMIGAN 0.01 % SOLN Place 1 drop into both eyes at bedtime.  5  . metoprolol tartrate (LOPRESSOR) 25 MG tablet Take 0.5 tablets (12.5 mg total) by mouth 2 (two) times daily. 45 tablet 1  . Multiple Vitamins-Minerals (CENTRUM SILVER 50+WOMEN) TABS Take 1 tablet  by mouth daily.    Marland Kitchen MYRBETRIQ 50 MG TB24 tablet Take by mouth daily.     . simvastatin (ZOCOR) 20 MG tablet Take 20 mg by mouth daily.     Derrill Memo ON 06/23/2019] furosemide (LASIX) 20 MG tablet Take 0.5 tablets (10 mg total) by mouth every Monday, Wednesday, and Friday. 20 tablet 1   No current facility-administered medications for this encounter.    Allergies  Allergen Reactions  . Penicillins Anaphylaxis    Did it involve swelling of the face/tongue/throat, SOB, or low BP? Yes Did it involve sudden or severe rash/hives, skin peeling, or any reaction on the inside of your mouth or nose? Yes Did you need to seek medical attention at a hospital or doctor's office? Yes When did it last happen? Unknown If all above answers are "NO", may proceed with cephalosporin use.     Social History   Socioeconomic History  . Marital status: Married    Spouse name: Not on file  . Number of children: Not on file  . Years of education: Not on file  . Highest education level: Not on file  Occupational History  . Not on file  Tobacco Use  . Smoking status: Never Smoker  . Smokeless tobacco: Never Used  Substance and Sexual Activity  . Alcohol use: Yes    Alcohol/week: 2.0 standard drinks    Types: 2 Standard drinks or equivalent per week    Comment: occassionally  . Drug use: No  . Sexual activity: Not on file  Other Topics Concern  . Not on file  Social History Narrative  . Not on file   Social Determinants of Health   Financial Resource Strain:   . Difficulty of Paying Living Expenses:   Food Insecurity:   . Worried About Charity fundraiser in the Last Year:   . Arboriculturist in the Last Year:   Transportation Needs:   . Film/video editor (Medical):   Marland Kitchen Lack of Transportation (Non-Medical):   Physical Activity:   . Days of Exercise per Week:   . Minutes of Exercise per Session:   Stress:   . Feeling of Stress :   Social Connections:   . Frequency of Communication  with Friends and Family:   . Frequency of Social Gatherings with Friends and Family:   . Attends Religious Services:   . Active Member of Clubs or Organizations:   . Attends Archivist Meetings:   Marland Kitchen Marital Status:   Intimate Partner Violence:   . Fear of Current or Ex-Partner:   . Emotionally Abused:   Marland Kitchen Physically Abused:   . Sexually Abused:     Family History  Problem Relation Age of Onset  . CAD Father  ROS- All systems are reviewed and negative except as per the HPI above  Physical Exam: Vitals:   06/22/19 1017  BP: 118/62  Pulse: 78  Weight: 63.2 kg  Height: 5' 1.5" (1.562 m)   Wt Readings from Last 3 Encounters:  06/22/19 63.2 kg  06/13/19 63.6 kg  05/11/18 61.7 kg    Labs: Lab Results  Component Value Date   NA 141 05/04/2018   K 3.6 05/04/2018   CL 109 05/04/2018   CO2 23 05/04/2018   GLUCOSE 102 (H) 05/04/2018   BUN 13 05/04/2018   CREATININE 0.81 05/04/2018   CALCIUM 9.4 05/04/2018   PHOS 4.3 03/23/2008   MG 1.8 12/18/2017   Lab Results  Component Value Date   INR 1.04 12/17/2017   No results found for: CHOL, HDL, LDLCALC, TRIG   GEN- The patient is well appearing, alert and oriented x 3 today.   Head- normocephalic, atraumatic Eyes-  Sclera clear, conjunctiva pink Ears- hearing intact Oropharynx- clear Neck- supple, no JVP Lymph- no cervical lymphadenopathy Lungs- Clear to ausculation bilaterally, normal work of breathing Heart- irregular rate and rhythm, no murmurs, rubs or gallops, PMI not laterally displaced GI- soft, NT, ND, + BS Extremities- no clubbing, cyanosis, or edema MS- no significant deformity or atrophy Skin- no rash or lesion Psych- euthymic mood, full affect Neuro- strength and sensation are intact  EKG-atrial flutter at 78 bpm Epic records reviewed Monitor results: 9/18-Study Highlights   The basic rhythm is normal sinus There are periods of atrial fibrillation with a low overall atrial fib  burden There are short runs of pSVT and wide complex tachycardia suspect aberration There are no pathologic pauses > 3 seconds       Assessment and Plan: 1. Asymptomatic atrial flutter  On BB and rate controlled She feels well other thatn has noted mild pedal edema I offered cardioversion but she declines I will try 20 mg lasix, 1/2 tab on M/W/F   2. Chadsvasc score of at least 4 Doing well on anticoagulation without signs of bleeding Continue  eliquis 2.5 mg bid  I will see back in 2 weeks    Lupita Leash C. [facility] [address] [place], [place] [address] [phone]

## 2019-06-22 NOTE — Patient Instructions (Signed)
Start Lasix 1/2 tablet (10mg ) on Monday, Wednesday, Friday only

## 2019-07-06 ENCOUNTER — Other Ambulatory Visit: Payer: Self-pay

## 2019-07-06 ENCOUNTER — Encounter (HOSPITAL_COMMUNITY): Payer: Self-pay | Admitting: Nurse Practitioner

## 2019-07-06 ENCOUNTER — Ambulatory Visit (HOSPITAL_COMMUNITY)
Admission: RE | Admit: 2019-07-06 | Discharge: 2019-07-06 | Disposition: A | Discharge status: Home / Self Care | Payer: Medicare PPO | Source: Ambulatory Visit | Attending: Nurse Practitioner | Admitting: Nurse Practitioner

## 2019-07-06 VITALS — BP 122/62 | HR 52 | Ht 61.5 in | Wt 139.8 lb

## 2019-07-06 DIAGNOSIS — E119 Type 2 diabetes mellitus without complications: Secondary | ICD-10-CM | POA: Diagnosis not present

## 2019-07-06 DIAGNOSIS — D6869 Other thrombophilia: Secondary | ICD-10-CM

## 2019-07-06 DIAGNOSIS — Z96642 Presence of left artificial hip joint: Secondary | ICD-10-CM | POA: Insufficient documentation

## 2019-07-06 DIAGNOSIS — Z79899 Other long term (current) drug therapy: Secondary | ICD-10-CM | POA: Diagnosis not present

## 2019-07-06 DIAGNOSIS — Z880 Allergy status to penicillin: Secondary | ICD-10-CM | POA: Insufficient documentation

## 2019-07-06 DIAGNOSIS — E7800 Pure hypercholesterolemia, unspecified: Secondary | ICD-10-CM | POA: Diagnosis not present

## 2019-07-06 DIAGNOSIS — I4892 Unspecified atrial flutter: Secondary | ICD-10-CM | POA: Diagnosis not present

## 2019-07-06 DIAGNOSIS — I10 Essential (primary) hypertension: Secondary | ICD-10-CM | POA: Insufficient documentation

## 2019-07-06 DIAGNOSIS — Z8249 Family history of ischemic heart disease and other diseases of the circulatory system: Secondary | ICD-10-CM | POA: Insufficient documentation

## 2019-07-06 DIAGNOSIS — D649 Anemia, unspecified: Secondary | ICD-10-CM | POA: Insufficient documentation

## 2019-07-06 DIAGNOSIS — I480 Paroxysmal atrial fibrillation: Secondary | ICD-10-CM | POA: Insufficient documentation

## 2019-07-06 DIAGNOSIS — Z7901 Long term (current) use of anticoagulants: Secondary | ICD-10-CM | POA: Diagnosis not present

## 2019-07-06 DIAGNOSIS — R600 Localized edema: Secondary | ICD-10-CM

## 2019-07-06 DIAGNOSIS — I4891 Unspecified atrial fibrillation: Secondary | ICD-10-CM | POA: Diagnosis present

## 2019-07-06 MED ORDER — AMLODIPINE BESYLATE 5 MG PO TABS: 5.0000 mg | ORAL_TABLET | Freq: Every day | ORAL | 3 refills | Status: DC

## 2019-07-06 NOTE — Progress Notes (Addendum)
Primary Care Physician: Lucianne Lei, MD Referring Physician: [facility] f/u    [name] is a 86 y.o. female with a h/o recent  hospitalization for diverticula bleed. She was noted to by ED RN to have syncope with HR in the 30's while having profuse rectal bleeding with clots. She also had some SVT on monitor and left with a holter monitor. This did show paroxysmal afib and she is now in the afib clinic to further discuss. She states that her bleeding spontaneously resolved and she was told she would not need a f/u colonoscopy. She is now in need of anticoagulation with new onset afib and with a chadsvasc score of 4. She is in SR today and was asymptomatic with afib on monitor. Her BB was increased when afib was found. She is chronically anemic with plt's around 120.   F/u in afib clinic 10/28. She was started on anticoagulation after hearing back from Dr. Benson Norway that it was reasonable to start and so far she is doing well. No signs of bleeding. No awareness of afib.  F/u in afib clinic, 11/19. Monitor reviewed that pt wore back in September that showed mostly sinus rhythm with low afib burden; She is in SR today but with a HR of 41 bpm, she is asymptomatic.No bleeding issues with eliquis, cbc done recently showed normal H/H. She had an echo done today, results pending.  F/u hospitalization in afib clinic 1/29. Pt has hypotension and dizziness and fell backward. She and her husband states that she did not pass out. Her HR is in the upper 40's with reduced BB dose to just once a day. She is not symptomatic with brady. She was in SR during her hospitalization.  Her BP's at home, reviewed today look in range without any low readings. They have not been recording the HR's.    F/u in afib clinic 06/13/19. She feels great but her EKG looks like an  atrial flutter with v rates around 100 bpm. She is asymptomatic. She does not think that she is on BB, for some reason it is not on her med list, but pharmacy  reports that she did pick up lopressor the end of January. It was a 90 day supply.   F/u in afib clinic, 06/22/19. She remains in atrial flutter but rate controlled now. She does confirm that she is taking BB. She  is asymptomatic other than noting some pedal edema. Her weight may be up 1-2 lbs. She  has had both covid shots now in the last month.  F/u in afib clinic, 07/06/19. She  is back in rhythm. Lasix did not help with her LLE.  Today, she denies symptoms of palpitations, chest pain, shortness of breath, orthopnea, PND, mild lower extremity edema, dizziness, presyncope, syncope, or neurologic sequela. The patient is tolerating medications without difficulties and is otherwise without complaint today.   Past Medical History:  Diagnosis Date  . Diabetes mellitus without complication ([place])   . Diverticulitis   . Hypercholesterolemia   . Hypertension   . Shingles    Past Surgical History:  Procedure Laterality Date  . ABDOMINAL HYSTERECTOMY    . COLONOSCOPY    . TOTAL HIP ARTHROPLASTY  2005   left sided    Current Outpatient Medications  Medication Sig Dispense Refill  . amLODipine (NORVASC) 10 MG tablet Take 5 mg by mouth daily.  3  . apixaban (ELIQUIS) 2.5 MG TABS tablet Take 1 tablet (2.5 mg total) by mouth 2 (  two) times daily. 60 tablet 3  . brimonidine-timolol (COMBIGAN) 0.2-0.5 % ophthalmic solution Place 1 drop into both eyes every 12 (twelve) hours.    . ferrous sulfate 325 (65 FE) MG tablet Take 1 tablet (325 mg total) by mouth every Monday, Wednesday, and Friday. (Patient taking differently: Take 325 mg by mouth 2 (two) times a week. ) 30 tablet 0  . furosemide (LASIX) 20 MG tablet Take 0.5 tablets (10 mg total) by mouth every Monday, Wednesday, and Friday. 20 tablet 1  . influenza vaccine adjuvanted (FLUAD QUADRIVALENT) 0.5 ML injection Fluad Quad 2020-2021(52yr up)(PF) 60 mcg (15 mcg x 4)/0.38mL IM syringe  ADM 0.5ML IM UTD    . LUMIGAN 0.01 % SOLN Place 1 drop into both  eyes at bedtime.  5  . metoprolol tartrate (LOPRESSOR) 25 MG tablet Take 0.5 tablets (12.5 mg total) by mouth 2 (two) times daily. 45 tablet 1  . Multiple Vitamins-Minerals (CENTRUM SILVER 50+WOMEN) TABS Take 1 tablet by mouth daily.    Marland Kitchen MYRBETRIQ 50 MG TB24 tablet Take by mouth daily.     . simvastatin (ZOCOR) 20 MG tablet Take 20 mg by mouth daily.      No current facility-administered medications for this encounter.    Allergies  Allergen Reactions  . Penicillins Anaphylaxis    Did it involve swelling of the face/tongue/throat, SOB, or low BP? Yes Did it involve sudden or severe rash/hives, skin peeling, or any reaction on the inside of your mouth or nose? Yes Did you need to seek medical attention at a hospital or doctor's office? Yes When did it last happen? Unknown If all above answers are "NO", may proceed with cephalosporin use.     Social History   Socioeconomic History  . Marital status: Married    Spouse name: Not on file  . Number of children: Not on file  . Years of education: Not on file  . Highest education level: Not on file  Occupational History  . Not on file  Tobacco Use  . Smoking status: Never Smoker  . Smokeless tobacco: Never Used  Substance and Sexual Activity  . Alcohol use: Yes    Alcohol/week: 2.0 standard drinks    Types: 2 Standard drinks or equivalent per week    Comment: occassionally  . Drug use: No  . Sexual activity: Not on file  Other Topics Concern  . Not on file  Social History Narrative  . Not on file   Social Determinants of Health   Financial Resource Strain:   . Difficulty of Paying Living Expenses:   Food Insecurity:   . Worried About Programme researcher, broadcasting/film/video in the Last Year:   . Barista in the Last Year:   Transportation Needs:   . Freight forwarder (Medical):   Marland Kitchen Lack of Transportation (Non-Medical):   Physical Activity:   . Days of Exercise per Week:   . Minutes of Exercise per Session:   Stress:   .  Feeling of Stress :   Social Connections:   . Frequency of Communication with Friends and Family:   . Frequency of Social Gatherings with Friends and Family:   . Attends Religious Services:   . Active Member of Clubs or Organizations:   . Attends Banker Meetings:   Marland Kitchen Marital Status:   Intimate Partner Violence:   . Fear of Current or Ex-Partner:   . Emotionally Abused:   Marland Kitchen Physically Abused:   . Sexually Abused:  Family History  Problem Relation Age of Onset  . CAD Father     ROS- All systems are reviewed and negative except as per the HPI above  Physical Exam: Vitals:   07/06/19 1022  BP: 122/62  Pulse: (!) 52  Weight: 63.4 kg  Height: 5' 1.5" (1.562 m)   Wt Readings from Last 3 Encounters:  07/06/19 63.4 kg  06/22/19 63.2 kg  06/13/19 63.6 kg    Labs: Lab Results  Component Value Date   NA 141 05/04/2018   K 3.6 05/04/2018   CL 109 05/04/2018   CO2 23 05/04/2018   GLUCOSE 102 (H) 05/04/2018   BUN 13 05/04/2018   CREATININE 0.81 05/04/2018   CALCIUM 9.4 05/04/2018   PHOS 4.3 03/23/2008   MG 1.8 12/18/2017   Lab Results  Component Value Date   INR 1.04 12/17/2017   No results found for: CHOL, HDL, LDLCALC, TRIG   GEN- The patient is well appearing, alert and oriented x 3 today.   Head- normocephalic, atraumatic Eyes-  Sclera clear, conjunctiva pink Ears- hearing intact Oropharynx- clear Neck- supple, no JVP Lymph- no cervical lymphadenopathy Lungs- Clear to ausculation bilaterally, normal work of breathing Heart- irregular rate and rhythm, no murmurs, rubs or gallops, PMI not laterally displaced GI- soft, NT, ND, + BS Extremities- no clubbing, cyanosis, or edema MS- no significant deformity or atrophy Skin- no rash or lesion Psych- euthymic mood, full affect Neuro- strength and sensation are intact  EKG-sinus brady at 52 bpm Epic records reviewed Monitor results: 9/18-Study Highlights   The basic rhythm is normal  sinus There are periods of atrial fibrillation with a low overall atrial fib burden There are short runs of pSVT and wide complex tachycardia suspect aberration There are no pathologic pauses > 3 seconds       Assessment and Plan: 1. Asymptomatic atrial flutter  Now back in rhythm  Continue BB   She feels well other thatn has noted mild pedal edema 20 mg lasix, 1/2 tab on M/W/F , did not seem to help Will  lower amlodipine to 1/2 tab daily, may be contributing   2. Chadsvasc score of at least 4 Doing well on anticoagulation without signs of bleeding Continue  eliquis 2.5 mg bid  I will see back in 10 days for BP/HR check and to assess LLE on lower dose of amlodipine    Lupita Leash C. [facility] [address] [place], [place] [address] [phone]

## 2019-07-06 NOTE — Addendum Note (Signed)
Encounter addended by: Shona Simpson, RN on: 07/06/2019 3:25 PM  Actions taken: Order list changed

## 2019-07-06 NOTE — Patient Instructions (Signed)
Decrease amlodipine (norvasc) to 5mg  a day  (1/2 tablet of 10mg  tablet)

## 2019-07-17 ENCOUNTER — Ambulatory Visit (HOSPITAL_COMMUNITY)
Admission: RE | Admit: 2019-07-17 | Discharge: 2019-07-17 | Disposition: A | Discharge status: Home / Self Care | Payer: Medicare PPO | Source: Ambulatory Visit | Attending: Nurse Practitioner | Admitting: Nurse Practitioner

## 2019-07-17 ENCOUNTER — Telehealth (HOSPITAL_COMMUNITY): Payer: Self-pay | Admitting: *Deleted

## 2019-07-17 ENCOUNTER — Other Ambulatory Visit: Payer: Self-pay

## 2019-07-17 VITALS — BP 106/62 | HR 92

## 2019-07-17 DIAGNOSIS — I480 Paroxysmal atrial fibrillation: Secondary | ICD-10-CM

## 2019-07-17 DIAGNOSIS — D509 Iron deficiency anemia, unspecified: Secondary | ICD-10-CM | POA: Diagnosis not present

## 2019-07-17 DIAGNOSIS — E785 Hyperlipidemia, unspecified: Secondary | ICD-10-CM | POA: Diagnosis not present

## 2019-07-17 DIAGNOSIS — I4892 Unspecified atrial flutter: Secondary | ICD-10-CM | POA: Insufficient documentation

## 2019-07-17 DIAGNOSIS — Z7901 Long term (current) use of anticoagulants: Secondary | ICD-10-CM | POA: Diagnosis not present

## 2019-07-17 DIAGNOSIS — I4891 Unspecified atrial fibrillation: Secondary | ICD-10-CM | POA: Diagnosis not present

## 2019-07-17 DIAGNOSIS — Z9181 History of falling: Secondary | ICD-10-CM | POA: Diagnosis not present

## 2019-07-17 DIAGNOSIS — D6869 Other thrombophilia: Secondary | ICD-10-CM | POA: Diagnosis not present

## 2019-07-17 DIAGNOSIS — Z818 Family history of other mental and behavioral disorders: Secondary | ICD-10-CM | POA: Diagnosis not present

## 2019-07-17 DIAGNOSIS — R600 Localized edema: Secondary | ICD-10-CM | POA: Diagnosis not present

## 2019-07-17 DIAGNOSIS — Z880 Allergy status to penicillin: Secondary | ICD-10-CM | POA: Diagnosis not present

## 2019-07-17 DIAGNOSIS — Z823 Family history of stroke: Secondary | ICD-10-CM | POA: Diagnosis not present

## 2019-07-17 DIAGNOSIS — G8929 Other chronic pain: Secondary | ICD-10-CM | POA: Diagnosis not present

## 2019-07-17 DIAGNOSIS — Z811 Family history of alcohol abuse and dependence: Secondary | ICD-10-CM | POA: Diagnosis not present

## 2019-07-17 DIAGNOSIS — Z96649 Presence of unspecified artificial hip joint: Secondary | ICD-10-CM | POA: Diagnosis not present

## 2019-07-17 DIAGNOSIS — I10 Essential (primary) hypertension: Secondary | ICD-10-CM | POA: Diagnosis not present

## 2019-07-17 DIAGNOSIS — E1165 Type 2 diabetes mellitus with hyperglycemia: Secondary | ICD-10-CM | POA: Diagnosis not present

## 2019-07-17 DIAGNOSIS — G3184 Mild cognitive impairment, so stated: Secondary | ICD-10-CM | POA: Diagnosis not present

## 2019-07-17 DIAGNOSIS — H409 Unspecified glaucoma: Secondary | ICD-10-CM | POA: Diagnosis not present

## 2019-07-17 NOTE — Telephone Encounter (Signed)
Home Health NP going over patient's medications in home states patient was accidentally taking 15mg  of amlodipine daily as she had 2 prescriptions. Per NP recommendations she will stop amlodipine and keep scheduled follow up/echocardiogram. Patient was in the background and again verbalized understanding to stop amlodipine.

## 2019-07-17 NOTE — Patient Instructions (Signed)
STOP amlodipine (norvasc)

## 2019-07-17 NOTE — Progress Notes (Signed)
Pt is in atrial flutter at 92 bpm, was in Sinus brady at 52 bpm on last visit. She reduced amlodipine but LEE persists. This is a fairly new finding. Will stop amlodipine as her BP is fairly soft at 106  systolic. I can  not increase BB as she is very slow in rhythm. I will order an echo and see her back in 2 weeks. She has lasix as need, does not feel this helps.

## 2019-07-17 NOTE — Addendum Note (Signed)
Encounter addended by: Newman Nip, NP on: 07/17/2019 11:05 AM  Actions taken: Clinical Note Signed

## 2019-07-18 ENCOUNTER — Other Ambulatory Visit (HOSPITAL_COMMUNITY): Payer: Self-pay | Admitting: Nurse Practitioner

## 2019-07-25 ENCOUNTER — Ambulatory Visit (HOSPITAL_COMMUNITY)
Admission: RE | Admit: 2019-07-25 | Discharge: 2019-07-25 | Disposition: A | Discharge status: Home / Self Care | Payer: Medicare PPO | Source: Ambulatory Visit | Attending: Family Medicine | Admitting: Family Medicine

## 2019-07-25 ENCOUNTER — Other Ambulatory Visit: Payer: Self-pay

## 2019-07-25 DIAGNOSIS — I351 Nonrheumatic aortic (valve) insufficiency: Secondary | ICD-10-CM | POA: Insufficient documentation

## 2019-07-25 DIAGNOSIS — I10 Essential (primary) hypertension: Secondary | ICD-10-CM | POA: Insufficient documentation

## 2019-07-25 DIAGNOSIS — E785 Hyperlipidemia, unspecified: Secondary | ICD-10-CM | POA: Insufficient documentation

## 2019-07-25 DIAGNOSIS — I480 Paroxysmal atrial fibrillation: Secondary | ICD-10-CM | POA: Diagnosis not present

## 2019-07-25 DIAGNOSIS — E119 Type 2 diabetes mellitus without complications: Secondary | ICD-10-CM | POA: Insufficient documentation

## 2019-07-25 NOTE — Progress Notes (Signed)
  Echocardiogram 2D Echocardiogram has been performed.  Leta Jungling M 07/25/2019, 10:05 AM

## 2019-08-03 ENCOUNTER — Ambulatory Visit (HOSPITAL_COMMUNITY)
Admission: RE | Admit: 2019-08-03 | Discharge: 2019-08-03 | Disposition: A | Discharge status: Home / Self Care | Payer: Medicare PPO | Source: Ambulatory Visit | Attending: Nurse Practitioner | Admitting: Nurse Practitioner

## 2019-08-03 ENCOUNTER — Encounter (HOSPITAL_COMMUNITY): Payer: Self-pay | Admitting: Nurse Practitioner

## 2019-08-03 ENCOUNTER — Other Ambulatory Visit: Payer: Self-pay

## 2019-08-03 VITALS — BP 122/76 | HR 92 | Ht 61.5 in | Wt 139.6 lb

## 2019-08-03 DIAGNOSIS — E119 Type 2 diabetes mellitus without complications: Secondary | ICD-10-CM | POA: Diagnosis not present

## 2019-08-03 DIAGNOSIS — I10 Essential (primary) hypertension: Secondary | ICD-10-CM | POA: Insufficient documentation

## 2019-08-03 DIAGNOSIS — I484 Atypical atrial flutter: Secondary | ICD-10-CM | POA: Diagnosis not present

## 2019-08-03 DIAGNOSIS — D6869 Other thrombophilia: Secondary | ICD-10-CM | POA: Diagnosis not present

## 2019-08-03 DIAGNOSIS — Z79899 Other long term (current) drug therapy: Secondary | ICD-10-CM | POA: Insufficient documentation

## 2019-08-03 DIAGNOSIS — E7800 Pure hypercholesterolemia, unspecified: Secondary | ICD-10-CM | POA: Insufficient documentation

## 2019-08-03 DIAGNOSIS — Z7901 Long term (current) use of anticoagulants: Secondary | ICD-10-CM | POA: Diagnosis not present

## 2019-08-03 DIAGNOSIS — K5790 Diverticulosis of intestine, part unspecified, without perforation or abscess without bleeding: Secondary | ICD-10-CM | POA: Insufficient documentation

## 2019-08-03 DIAGNOSIS — I480 Paroxysmal atrial fibrillation: Secondary | ICD-10-CM | POA: Insufficient documentation

## 2019-08-03 DIAGNOSIS — I4892 Unspecified atrial flutter: Secondary | ICD-10-CM | POA: Diagnosis not present

## 2019-08-03 DIAGNOSIS — M1712 Unilateral primary osteoarthritis, left knee: Secondary | ICD-10-CM | POA: Diagnosis not present

## 2019-08-03 NOTE — Progress Notes (Signed)
Primary Care Physician: Renaye Rakers, MD Referring Physician: [facility] f/u    [name] is a 86 y.o. female with a h/o recent  hospitalization for diverticula bleed. She was noted to by ED RN to have syncope with HR in the 30's while having profuse rectal bleeding with clots. She also had some SVT on monitor and left with a holter monitor. This did show paroxysmal afib and she is now in the afib clinic to further discuss. She states that her bleeding spontaneously resolved and she was told she would not need a f/u colonoscopy. She is now in need of anticoagulation with new onset afib and with a chadsvasc score of 4. She is in SR today and was asymptomatic with afib on monitor. Her BB was increased when afib was found. She is chronically anemic with plt's around 120.   F/u in afib clinic 10/28. She was started on anticoagulation after hearing back from Dr. Elnoria Howard that it was reasonable to start and so far she is doing well. No signs of bleeding. No awareness of afib.  F/u in afib clinic, 11/19. Monitor reviewed that pt wore back in September that showed mostly sinus rhythm with low afib burden; She is in SR today but with a HR of 41 bpm, she is asymptomatic.No bleeding issues with eliquis, cbc done recently showed normal H/H. She had an echo done today, results pending.  F/u hospitalization in afib clinic 1/29. Pt has hypotension and dizziness and fell backward. She and her husband states that she did not pass out. Her HR is in the upper 40's with reduced BB dose to just once a day. She is not symptomatic with brady. She was in SR during her hospitalization.  Her BP's at home, reviewed today look in range without any low readings. They have not been recording the HR's.    F/u in afib clinic 06/13/19. She feels great but her EKG looks like an  atrial flutter with v rates around 100 bpm. She is asymptomatic. She does not think that she is on BB, for some reason it is not on her med list, but pharmacy  reports that she did pick up lopressor the end of January. It was a 90 day supply.   F/u in afib clinic, 06/22/19. She remains in atrial flutter but rate controlled now. She does confirm that she is taking BB. She  is asymptomatic other than noting some pedal edema. Her weight may be up 1-2 lbs. She  has had both covid shots now in the last month.  F/u in afib clinic, 07/06/19. She  is back in rhythm. Lasix did not help with her LLE.  F/u 4/22. Stopping amlodipine did help with her LLE plus low dose furosemide. Echo showed normal EF. She  is in rate controlled flutter today, she is completely   asymptomatic. Appears to be paroxysmal as she was in rhythm on last visit.  She denies any fatigue, shortness of breath with exertion, lightheaded.  Today, she denies symptoms of palpitations, chest pain, shortness of breath, orthopnea, PND, mild lower extremity edema, dizziness, presyncope, syncope, or neurologic sequela. The patient is tolerating medications without difficulties and is otherwise without complaint today.   Past Medical History:  Diagnosis Date  . Diabetes mellitus without complication ([facility])   . Diverticulitis   . Hypercholesterolemia   . Hypertension   . Shingles    Past Surgical History:  Procedure Laterality Date  . ABDOMINAL HYSTERECTOMY    . COLONOSCOPY    .  TOTAL HIP ARTHROPLASTY  2005   left sided    Current Outpatient Medications  Medication Sig Dispense Refill  . apixaban (ELIQUIS) 2.5 MG TABS tablet Take 1 tablet (2.5 mg total) by mouth 2 (two) times daily. 60 tablet 3  . brimonidine-timolol (COMBIGAN) 0.2-0.5 % ophthalmic solution Place 1 drop into both eyes every 12 (twelve) hours.    . ferrous sulfate 325 (65 FE) MG tablet Take 1 tablet (325 mg total) by mouth every Monday, Wednesday, and Friday. (Patient taking differently: Take 325 mg by mouth 2 (two) times a week. ) 30 tablet 0  . furosemide (LASIX) 20 MG tablet Take 0.5 tablets (10 mg total) by mouth every Monday,  Wednesday, and Friday. 20 tablet 1  . influenza vaccine adjuvanted (FLUAD QUADRIVALENT) 0.5 ML injection Fluad Quad 2020-2021(92yr up)(PF) 60 mcg (15 mcg x 4)/0.69mL IM syringe  ADM 0.5ML IM UTD    . LUMIGAN 0.01 % SOLN Place 1 drop into both eyes at bedtime.  5  . metoprolol tartrate (LOPRESSOR) 25 MG tablet Take 0.5 tablets (12.5 mg total) by mouth 2 (two) times daily. 30 tablet 2  . Multiple Vitamins-Minerals (CENTRUM SILVER 50+WOMEN) TABS Take 1 tablet by mouth daily.    Marland Kitchen MYRBETRIQ 50 MG TB24 tablet Take by mouth daily.     . simvastatin (ZOCOR) 20 MG tablet Take 20 mg by mouth daily.      No current facility-administered medications for this encounter.    Allergies  Allergen Reactions  . Penicillins Anaphylaxis    Did it involve swelling of the face/tongue/throat, SOB, or low BP? Yes Did it involve sudden or severe rash/hives, skin peeling, or any reaction on the inside of your mouth or nose? Yes Did you need to seek medical attention at a hospital or doctor's office? Yes When did it last happen? Unknown If all above answers are "NO", may proceed with cephalosporin use.     Social History   Socioeconomic History  . Marital status: Married    Spouse name: Not on file  . Number of children: Not on file  . Years of education: Not on file  . Highest education level: Not on file  Occupational History  . Not on file  Tobacco Use  . Smoking status: Never Smoker  . Smokeless tobacco: Never Used  Substance and Sexual Activity  . Alcohol use: Yes    Alcohol/week: 2.0 standard drinks    Types: 2 Standard drinks or equivalent per week    Comment: occassionally  . Drug use: No  . Sexual activity: Not on file  Other Topics Concern  . Not on file  Social History Narrative  . Not on file   Social Determinants of Health   Financial Resource Strain:   . Difficulty of Paying Living Expenses:   Food Insecurity:   . Worried About Programme researcher, broadcasting/film/video in the Last Year:   . Garment/textile technologist in the Last Year:   Transportation Needs:   . Freight forwarder (Medical):   Marland Kitchen Lack of Transportation (Non-Medical):   Physical Activity:   . Days of Exercise per Week:   . Minutes of Exercise per Session:   Stress:   . Feeling of Stress :   Social Connections:   . Frequency of Communication with Friends and Family:   . Frequency of Social Gatherings with Friends and Family:   . Attends Religious Services:   . Active Member of Clubs or Organizations:   . Attends  Club or Organization Meetings:   Marland Kitchen Marital Status:   Intimate Partner Violence:   . Fear of Current or Ex-Partner:   . Emotionally Abused:   Marland Kitchen Physically Abused:   . Sexually Abused:     Family History  Problem Relation Age of Onset  . CAD Father     ROS- All systems are reviewed and negative except as per the HPI above  Physical Exam: Vitals:   08/03/19 1126  BP: 122/76  Pulse: 92  Weight: 63.3 kg  Height: 5' 1.5" (1.562 m)   Wt Readings from Last 3 Encounters:  08/03/19 63.3 kg  07/06/19 63.4 kg  06/22/19 63.2 kg    Labs: Lab Results  Component Value Date   NA 141 05/04/2018   K 3.6 05/04/2018   CL 109 05/04/2018   CO2 23 05/04/2018   GLUCOSE 102 (H) 05/04/2018   BUN 13 05/04/2018   CREATININE 0.81 05/04/2018   CALCIUM 9.4 05/04/2018   PHOS 4.3 03/23/2008   MG 1.8 12/18/2017   Lab Results  Component Value Date   INR 1.04 12/17/2017   No results found for: CHOL, HDL, LDLCALC, TRIG   GEN- The patient is well appearing, alert and oriented x 3 today.   Head- normocephalic, atraumatic Eyes-  Sclera clear, conjunctiva pink Ears- hearing intact Oropharynx- clear Neck- supple, no JVP Lymph- no cervical lymphadenopathy Lungs- Clear to ausculation bilaterally, normal work of breathing Heart- irregular rate and rhythm, no murmurs, rubs or gallops, PMI not laterally displaced GI- soft, NT, ND, + BS Extremities- no clubbing, cyanosis, or edema MS- no significant deformity or  atrophy Skin- no rash or lesion Psych- euthymic mood, full affect Neuro- strength and sensation are intact  EKG-sinus brady at 52 bpm Epic records reviewed Monitor results: 9/18-Study Highlights   The basic rhythm is normal sinus There are periods of atrial fibrillation with a low overall atrial fib burden There are short runs of pSVT and wide complex tachycardia suspect aberration There are no pathologic pauses > 3 seconds   Echo-IMPRESSIONS    1. Inferior basal hypokinesis . Left ventricular ejection fraction, by  estimation, is 55 to 60%. The left ventricle has normal function. The left  ventricle has no regional wall motion abnormalities. Left ventricular  diastolic parameters are  indeterminate.  2. Right ventricular systolic function is normal. The right ventricular  size is normal. There is normal pulmonary artery systolic pressure.  3. Left atrial size was mildly dilated.  4. Aneurysmal segment no obvious PFO.  5. The mitral valve is normal in structure. Mild mitral valve  regurgitation. No evidence of mitral stenosis.  6. The aortic valve is tricuspid. Aortic valve regurgitation is mild.  Mild aortic valve sclerosis is present, with no evidence of aortic valve  stenosis.  7. Aortic dilatation noted. There is mild dilatation of the aortic root  measuring 37 mm.  8. The inferior vena cava is normal in size with greater than 50%  respiratory variability, suggesting right atrial pressure of 3 mmHg.    Assessment and Plan: 1. Asymptomatic paroxysmal  atrial flutter  Out of rhythm today Will not increase BB as she is slow when she returns to normal rhythm  Continue BB  at current dose Discussed antiarrythmic's to encourage more SR, she defers as she is asymptomatic, which is reasonable at this point  She feels well other than has noted mild pedal edema 20 mg lasix, 1/2 tab on M/W/F  has now helped with also  stopping  amlodipine  Told to notify the office if any  increase in PE, shortness of breath, fatigue   2. Chadsvasc score of at least 4 Doing well on anticoagulation without signs of bleeding Continue  eliquis 2.5 mg bid  I will see back in 3 months    Butch Penny C. Giang Hemme, [facility] [address] [place], [place] [address] [phone]

## 2019-08-15 DIAGNOSIS — R7309 Other abnormal glucose: Secondary | ICD-10-CM | POA: Diagnosis not present

## 2019-08-15 DIAGNOSIS — I4891 Unspecified atrial fibrillation: Secondary | ICD-10-CM | POA: Diagnosis not present

## 2019-08-15 DIAGNOSIS — M130 Polyarthritis, unspecified: Secondary | ICD-10-CM | POA: Diagnosis not present

## 2019-08-15 DIAGNOSIS — I10 Essential (primary) hypertension: Secondary | ICD-10-CM | POA: Diagnosis not present

## 2019-08-15 DIAGNOSIS — E782 Mixed hyperlipidemia: Secondary | ICD-10-CM | POA: Diagnosis not present

## 2019-08-15 DIAGNOSIS — I4589 Other specified conduction disorders: Secondary | ICD-10-CM | POA: Diagnosis not present

## 2019-10-03 DIAGNOSIS — H5712 Ocular pain, left eye: Secondary | ICD-10-CM | POA: Diagnosis not present

## 2019-10-03 DIAGNOSIS — H209 Unspecified iridocyclitis: Secondary | ICD-10-CM | POA: Diagnosis not present

## 2019-10-03 DIAGNOSIS — E119 Type 2 diabetes mellitus without complications: Secondary | ICD-10-CM | POA: Diagnosis not present

## 2019-10-03 DIAGNOSIS — H401133 Primary open-angle glaucoma, bilateral, severe stage: Secondary | ICD-10-CM | POA: Diagnosis not present

## 2019-10-18 ENCOUNTER — Other Ambulatory Visit (HOSPITAL_COMMUNITY): Payer: Self-pay | Admitting: Nurse Practitioner

## 2019-10-26 ENCOUNTER — Other Ambulatory Visit (HOSPITAL_COMMUNITY): Payer: Self-pay | Admitting: Nurse Practitioner

## 2019-10-26 DIAGNOSIS — R351 Nocturia: Secondary | ICD-10-CM | POA: Diagnosis not present

## 2019-10-28 ENCOUNTER — Other Ambulatory Visit (HOSPITAL_COMMUNITY): Payer: Self-pay | Admitting: Nurse Practitioner

## 2019-11-14 DIAGNOSIS — M1712 Unilateral primary osteoarthritis, left knee: Secondary | ICD-10-CM | POA: Diagnosis not present

## 2019-11-17 DIAGNOSIS — H209 Unspecified iridocyclitis: Secondary | ICD-10-CM | POA: Diagnosis not present

## 2019-11-17 DIAGNOSIS — E119 Type 2 diabetes mellitus without complications: Secondary | ICD-10-CM | POA: Diagnosis not present

## 2019-11-17 DIAGNOSIS — H5712 Ocular pain, left eye: Secondary | ICD-10-CM | POA: Diagnosis not present

## 2019-11-17 DIAGNOSIS — H401133 Primary open-angle glaucoma, bilateral, severe stage: Secondary | ICD-10-CM | POA: Diagnosis not present

## 2019-11-30 DIAGNOSIS — H401133 Primary open-angle glaucoma, bilateral, severe stage: Secondary | ICD-10-CM | POA: Diagnosis not present

## 2019-11-30 DIAGNOSIS — H209 Unspecified iridocyclitis: Secondary | ICD-10-CM | POA: Diagnosis not present

## 2019-11-30 DIAGNOSIS — E119 Type 2 diabetes mellitus without complications: Secondary | ICD-10-CM | POA: Diagnosis not present

## 2019-11-30 DIAGNOSIS — H5712 Ocular pain, left eye: Secondary | ICD-10-CM | POA: Diagnosis not present

## 2019-12-01 DIAGNOSIS — N3281 Overactive bladder: Secondary | ICD-10-CM | POA: Diagnosis not present

## 2019-12-01 DIAGNOSIS — R3915 Urgency of urination: Secondary | ICD-10-CM | POA: Diagnosis not present

## 2019-12-01 DIAGNOSIS — N3941 Urge incontinence: Secondary | ICD-10-CM | POA: Diagnosis not present

## 2019-12-03 DIAGNOSIS — I10 Essential (primary) hypertension: Secondary | ICD-10-CM | POA: Diagnosis not present

## 2019-12-03 DIAGNOSIS — Z8719 Personal history of other diseases of the digestive system: Secondary | ICD-10-CM | POA: Diagnosis not present

## 2019-12-03 DIAGNOSIS — E785 Hyperlipidemia, unspecified: Secondary | ICD-10-CM | POA: Diagnosis not present

## 2019-12-03 DIAGNOSIS — K5731 Diverticulosis of large intestine without perforation or abscess with bleeding: Secondary | ICD-10-CM | POA: Diagnosis not present

## 2019-12-03 DIAGNOSIS — K5791 Diverticulosis of intestine, part unspecified, without perforation or abscess with bleeding: Secondary | ICD-10-CM | POA: Diagnosis not present

## 2019-12-03 DIAGNOSIS — Z7901 Long term (current) use of anticoagulants: Secondary | ICD-10-CM | POA: Diagnosis not present

## 2019-12-03 DIAGNOSIS — K921 Melena: Secondary | ICD-10-CM | POA: Diagnosis not present

## 2019-12-03 DIAGNOSIS — I480 Paroxysmal atrial fibrillation: Secondary | ICD-10-CM | POA: Diagnosis not present

## 2019-12-03 DIAGNOSIS — R0902 Hypoxemia: Secondary | ICD-10-CM | POA: Diagnosis not present

## 2019-12-03 DIAGNOSIS — R1084 Generalized abdominal pain: Secondary | ICD-10-CM | POA: Diagnosis not present

## 2019-12-03 DIAGNOSIS — D696 Thrombocytopenia, unspecified: Secondary | ICD-10-CM | POA: Diagnosis not present

## 2019-12-03 DIAGNOSIS — R55 Syncope and collapse: Secondary | ICD-10-CM | POA: Diagnosis not present

## 2019-12-03 DIAGNOSIS — R58 Hemorrhage, not elsewhere classified: Secondary | ICD-10-CM | POA: Diagnosis not present

## 2019-12-03 DIAGNOSIS — R231 Pallor: Secondary | ICD-10-CM | POA: Diagnosis not present

## 2019-12-03 DIAGNOSIS — Z79899 Other long term (current) drug therapy: Secondary | ICD-10-CM | POA: Diagnosis not present

## 2019-12-03 DIAGNOSIS — E119 Type 2 diabetes mellitus without complications: Secondary | ICD-10-CM | POA: Diagnosis not present

## 2019-12-04 DIAGNOSIS — K5791 Diverticulosis of intestine, part unspecified, without perforation or abscess with bleeding: Secondary | ICD-10-CM | POA: Diagnosis not present

## 2019-12-05 DIAGNOSIS — K5791 Diverticulosis of intestine, part unspecified, without perforation or abscess with bleeding: Secondary | ICD-10-CM | POA: Diagnosis not present

## 2019-12-13 DIAGNOSIS — E1165 Type 2 diabetes mellitus with hyperglycemia: Secondary | ICD-10-CM | POA: Diagnosis not present

## 2019-12-13 DIAGNOSIS — I10 Essential (primary) hypertension: Secondary | ICD-10-CM | POA: Diagnosis not present

## 2019-12-13 DIAGNOSIS — Z6827 Body mass index (BMI) 27.0-27.9, adult: Secondary | ICD-10-CM | POA: Diagnosis not present

## 2019-12-13 DIAGNOSIS — K5701 Diverticulitis of small intestine with perforation and abscess with bleeding: Secondary | ICD-10-CM | POA: Diagnosis not present

## 2019-12-25 ENCOUNTER — Inpatient Hospital Stay (HOSPITAL_COMMUNITY): Admission: RE | Admit: 2019-12-25 | Payer: Medicare PPO | Source: Ambulatory Visit | Admitting: Nurse Practitioner

## 2019-12-29 ENCOUNTER — Other Ambulatory Visit: Payer: Self-pay

## 2019-12-29 ENCOUNTER — Ambulatory Visit (HOSPITAL_COMMUNITY)
Admission: RE | Admit: 2019-12-29 | Discharge: 2019-12-29 | Disposition: A | Discharge status: Home / Self Care | Payer: Medicare PPO | Source: Ambulatory Visit | Attending: Nurse Practitioner | Admitting: Nurse Practitioner

## 2019-12-29 ENCOUNTER — Encounter (HOSPITAL_COMMUNITY): Payer: Self-pay | Admitting: Nurse Practitioner

## 2019-12-29 VITALS — BP 166/76 | HR 60 | Ht 61.5 in | Wt 136.4 lb

## 2019-12-29 DIAGNOSIS — D6869 Other thrombophilia: Secondary | ICD-10-CM | POA: Diagnosis not present

## 2019-12-29 DIAGNOSIS — I10 Essential (primary) hypertension: Secondary | ICD-10-CM | POA: Diagnosis not present

## 2019-12-29 DIAGNOSIS — Z79899 Other long term (current) drug therapy: Secondary | ICD-10-CM | POA: Diagnosis not present

## 2019-12-29 DIAGNOSIS — I4892 Unspecified atrial flutter: Secondary | ICD-10-CM | POA: Insufficient documentation

## 2019-12-29 DIAGNOSIS — Z7901 Long term (current) use of anticoagulants: Secondary | ICD-10-CM | POA: Diagnosis not present

## 2019-12-29 DIAGNOSIS — I480 Paroxysmal atrial fibrillation: Secondary | ICD-10-CM | POA: Diagnosis not present

## 2019-12-29 DIAGNOSIS — E119 Type 2 diabetes mellitus without complications: Secondary | ICD-10-CM | POA: Diagnosis not present

## 2019-12-29 DIAGNOSIS — E7800 Pure hypercholesterolemia, unspecified: Secondary | ICD-10-CM | POA: Insufficient documentation

## 2019-12-29 DIAGNOSIS — I517 Cardiomegaly: Secondary | ICD-10-CM | POA: Insufficient documentation

## 2019-12-29 NOTE — Progress Notes (Signed)
Primary Care Physician: Renaye Rakers, MD Referring Physician: [facility] f/u    [name] is a 86 y.o. female with a h/o recent  hospitalization for diverticula bleed. She was noted to by ED RN to have syncope with HR in the 30's while having profuse rectal bleeding with clots. She also had some SVT on monitor and left with a holter monitor. This did show paroxysmal afib and she is now in the afib clinic to further discuss. She states that her bleeding spontaneously resolved and she was told she would not need a f/u colonoscopy. She is now in need of anticoagulation with new onset afib and with a chadsvasc score of 4. She is in SR today and was asymptomatic with afib on monitor. Her BB was increased when afib was found. She is chronically anemic with plt's around 120.   F/u in afib clinic 10/28. She was started on anticoagulation after hearing back from Dr. Elnoria Howard that it was reasonable to start and so far she is doing well. No signs of bleeding. No awareness of afib.  F/u in afib clinic, 11/19. Monitor reviewed that pt wore back in September that showed mostly sinus rhythm with low afib burden; She is in SR today but with a HR of 41 bpm, she is asymptomatic.No bleeding issues with eliquis, cbc done recently showed normal H/H. She had an echo done today, results pending.  F/u hospitalization in afib clinic 1/29. Pt has hypotension and dizziness and fell backward. She and her husband states that she did not pass out. Her HR is in the upper 40's with reduced BB dose to just once a day. She is not symptomatic with brady. She was in SR during her hospitalization.  Her BP's at home, reviewed today look in range without any low readings. They have not been recording the HR's.    F/u in afib clinic 06/13/19. She feels great but her EKG looks like an  atrial flutter with v rates around 100 bpm. She is asymptomatic. She does not think that she is on BB, for some reason it is not on her med list, but pharmacy  reports that she did pick up lopressor the end of January. It was a 90 day supply.   F/u in afib clinic, 06/22/19. She remains in atrial flutter but rate controlled now. She does confirm that she is taking BB. She  is asymptomatic other than noting some pedal edema. Her weight may be up 1-2 lbs. She  has had both covid shots now in the last month.  F/u in afib clinic, 07/06/19. She  is back in rhythm. Lasix did not help with her LLE.  F/u 4/22. Stopping amlodipine did help with her LLE plus low dose furosemide. Echo showed normal EF. She  is in rate controlled flutter today, she is completely   asymptomatic. Appears to be paroxysmal as she was in rhythm on last visit.  She denies any fatigue, shortness of breath with exertion, lightheaded.  F/u in afib clinic, 9/17. She had a diverticular bleed 12/03/19,treated at [facility]. HGB dropped  to 10.3 with HGB back to 11.0 8/24, she held her anticoagulation x one week and restarted . She  has not noted any further bleeding. She is in SR today. Has had some bilateral LLE , but  has not taken her prn lasix. Otherwise, no complaints. She  had labs drawn with her PCP right after hospitalization and has another appointment 10/5 for f/u.    Today, she  denies symptoms of palpitations, chest pain, shortness of breath, orthopnea, PND, mild lower extremity edema, dizziness, presyncope, syncope, or neurologic sequela. The patient is tolerating medications without difficulties and is otherwise without complaint today.   Past Medical History:  Diagnosis Date  . Diabetes mellitus without complication (HCC)   . Diverticulitis   . Hypercholesterolemia   . Hypertension   . Shingles    Past Surgical History:  Procedure Laterality Date  . ABDOMINAL HYSTERECTOMY    . COLONOSCOPY    . TOTAL HIP ARTHROPLASTY  2005   left sided    Current Outpatient Medications  Medication Sig Dispense Refill  . acetaminophen (TYLENOL) 500 MG tablet Take 500 mg by mouth every 6 (six)  hours as needed.     Marland Kitchen apixaban (ELIQUIS) 2.5 MG TABS tablet Take 1 tablet (2.5 mg total) by mouth 2 (two) times daily. Appointment Required For Further Refills [phone] tablet 3  . brimonidine-timolol (COMBIGAN) 0.2-0.5 % ophthalmic solution Place 1 drop into both eyes every 12 (twelve) hours.    . influenza vaccine adjuvanted (FLUAD QUADRIVALENT) 0.5 ML injection Fluad Quad 2020-2021(34yr up)(PF) 60 mcg (15 mcg x 4)/0.25mL IM syringe  ADM 0.5ML IM UTD    . LUMIGAN 0.01 % SOLN Place 1 drop into both eyes at bedtime.  5  . metoprolol tartrate (LOPRESSOR) 25 MG tablet Take 0.5 tablets (12.5 mg total) by mouth 2 (two) times daily. 30 tablet 2  . Multiple Vitamins-Minerals (CENTRUM SILVER 50+WOMEN) TABS Take 1 tablet by mouth daily.    . nepafenac (ILEVRO) 0.3 % ophthalmic suspension Place 1 drop into both eyes 2 (two) times daily.    . simvastatin (ZOCOR) 20 MG tablet Take 20 mg by mouth daily.     . furosemide (LASIX) 20 MG tablet TAKE HALF TABLET BY MOUTH EVERY MONDAY, WEDNESDAY, AND FRIDAY (Patient not taking: Reported on 12/29/2019) 20 tablet 3   No current facility-administered medications for this encounter.    Allergies  Allergen Reactions  . Penicillins Anaphylaxis and Other (See Comments)    Did it involve swelling of the face/tongue/throat, SOB, or low BP? Yes Did it involve sudden or severe rash/hives, skin peeling, or any reaction on the inside of your mouth or nose? Yes Did you need to seek medical attention at a hospital or doctor's office? Yes When did it last happen? Unknown If all above answers are "NO", may proceed with cephalosporin use.  Did it involve swelling of the face/tongue/throat, SOB, or low BP? Yes Did it involve sudden or severe rash/hives, skin peeling, or any reaction on the inside of your mouth or nose? Yes Did you need to seek medical attention at a hospital or doctor's office? Yes When did it last happen? Unknown If all above answers are "NO", may  proceed with cephalosporin use.     Social History   Socioeconomic History  . Marital status: Married    Spouse name: Not on file  . Number of children: Not on file  . Years of education: Not on file  . Highest education level: Not on file  Occupational History  . Not on file  Tobacco Use  . Smoking status: Never Smoker  . Smokeless tobacco: Never Used  Substance and Sexual Activity  . Alcohol use: Yes    Alcohol/week: 2.0 standard drinks    Types: 2 Standard drinks or equivalent per week    Comment: occassionally  . Drug use: No  . Sexual activity: Not on file  Other Topics  Concern  . Not on file  Social History Narrative  . Not on file   Social Determinants of Health   Financial Resource Strain:   . Difficulty of Paying Living Expenses: Not on file  Food Insecurity:   . Worried About Programme researcher, broadcasting/film/video in the Last Year: Not on file  . Ran Out of Food in the Last Year: Not on file  Transportation Needs:   . Lack of Transportation (Medical): Not on file  . Lack of Transportation (Non-Medical): Not on file  Physical Activity:   . Days of Exercise per Week: Not on file  . Minutes of Exercise per Session: Not on file  Stress:   . Feeling of Stress : Not on file  Social Connections:   . Frequency of Communication with Friends and Family: Not on file  . Frequency of Social Gatherings with Friends and Family: Not on file  . Attends Religious Services: Not on file  . Active Member of Clubs or Organizations: Not on file  . Attends Banker Meetings: Not on file  . Marital Status: Not on file  Intimate Partner Violence:   . Fear of Current or Ex-Partner: Not on file  . Emotionally Abused: Not on file  . Physically Abused: Not on file  . Sexually Abused: Not on file    Family History  Problem Relation Age of Onset  . CAD Father     ROS- All systems are reviewed and negative except as per the HPI above  Physical Exam: Vitals:   12/29/19 1001  BP:  (!) 166/76  Pulse: 60  Weight: 61.9 kg  Height: 5' 1.5" (1.562 m)   Wt Readings from Last 3 Encounters:  12/29/19 61.9 kg  08/03/19 63.3 kg  07/06/19 63.4 kg    Labs: Lab Results  Component Value Date   NA 141 05/04/2018   K 3.6 05/04/2018   CL 109 05/04/2018   CO2 23 05/04/2018   GLUCOSE 102 (H) 05/04/2018   BUN 13 05/04/2018   CREATININE 0.81 05/04/2018   CALCIUM 9.4 05/04/2018   PHOS 4.3 03/23/2008   MG 1.8 12/18/2017   Lab Results  Component Value Date   INR 1.04 12/17/2017   No results found for: CHOL, HDL, LDLCALC, TRIG   GEN- The patient is well appearing, alert and oriented x 3 today.   Head- normocephalic, atraumatic Eyes-  Sclera clear, conjunctiva pink Ears- hearing intact Oropharynx- clear Neck- supple, no JVP Lymph- no cervical lymphadenopathy Lungs- Clear to ausculation bilaterally, normal work of breathing Heart regular rate and rhythm, no murmurs, rubs or gallops, PMI not laterally displaced GI- soft, NT, ND, + BS Extremities- no clubbing, cyanosis, or edema MS- no significant deformity or atrophy Skin- no rash or lesion Psych- euthymic mood, full affect Neuro- strength and sensation are intact  EKG-NSR at 60 bpm, pr int 180 ms, qrs int 76 ms, qtc 402 ms  Epic records reviewed Monitor results: 9/18-Study Highlights   The basic rhythm is normal sinus There are periods of atrial fibrillation with a low overall atrial fib burden There are short runs of pSVT and wide complex tachycardia suspect aberration There are no pathologic pauses > 3 seconds   Echo-IMPRESSIONS    1. Inferior basal hypokinesis . Left ventricular ejection fraction, by  estimation, is 55 to 60%. The left ventricle has normal function. The left  ventricle has no regional wall motion abnormalities. Left ventricular  diastolic parameters are  indeterminate.  2. Right ventricular  systolic function is normal. The right ventricular  size is normal. There is normal pulmonary  artery systolic pressure.  3. Left atrial size was mildly dilated.  4. Aneurysmal segment no obvious PFO.  5. The mitral valve is normal in structure. Mild mitral valve  regurgitation. No evidence of mitral stenosis.  6. The aortic valve is tricuspid. Aortic valve regurgitation is mild.  Mild aortic valve sclerosis is present, with no evidence of aortic valve  stenosis.  7. Aortic dilatation noted. There is mild dilatation of the aortic root  measuring 37 mm.  8. The inferior vena cava is normal in size with greater than 50%  respiratory variability, suggesting right atrial pressure of 3 mmHg.    Assessment and Plan:  1. Asymptomatic paroxysmal  atrial flutter  In SR today  Continue BB  at current dose She feels well other than has intermittent  pedal edema She has not taken her prn lasix , encouraged to go home and take a dose  and will help BP as well  2. Chadsvasc score of at least 4 Doing well on anticoagulation without signs of bleeding with restart one week ago Has f/u with PCP 10/5, will need repeat CBC at that time will restart of anticoagulation  Continue  eliquis 2.5 mg bid   I will see back in 6 months    Lupita LeashDonna C. Matthew Folksarroll, ANP-C [facility] [address] [place], [place] [address] [phone]

## 2020-01-04 DIAGNOSIS — T50901A Poisoning by unspecified drugs, medicaments and biological substances, accidental (unintentional), initial encounter: Secondary | ICD-10-CM | POA: Diagnosis not present

## 2020-01-04 DIAGNOSIS — R413 Other amnesia: Secondary | ICD-10-CM | POA: Diagnosis not present

## 2020-01-04 DIAGNOSIS — Z6821 Body mass index (BMI) 21.0-21.9, adult: Secondary | ICD-10-CM | POA: Diagnosis not present

## 2020-01-05 ENCOUNTER — Ambulatory Visit
Admission: EM | Admit: 2020-01-05 | Discharge: 2020-01-05 | Disposition: A | Discharge status: Home / Self Care | Payer: Medicare PPO

## 2020-01-05 ENCOUNTER — Other Ambulatory Visit: Payer: Self-pay

## 2020-01-05 ENCOUNTER — Emergency Department (HOSPITAL_COMMUNITY)
Admission: EM | Admit: 2020-01-05 | Discharge: 2020-01-05 | Disposition: A | Discharge status: Home / Self Care | Payer: Medicare PPO | Attending: Emergency Medicine | Admitting: Emergency Medicine

## 2020-01-05 ENCOUNTER — Encounter (HOSPITAL_COMMUNITY): Payer: Self-pay | Admitting: Emergency Medicine

## 2020-01-05 ENCOUNTER — Emergency Department (HOSPITAL_COMMUNITY): Payer: Medicare PPO

## 2020-01-05 DIAGNOSIS — M1712 Unilateral primary osteoarthritis, left knee: Secondary | ICD-10-CM | POA: Diagnosis not present

## 2020-01-05 DIAGNOSIS — Z96652 Presence of left artificial knee joint: Secondary | ICD-10-CM | POA: Insufficient documentation

## 2020-01-05 DIAGNOSIS — T1490XA Injury, unspecified, initial encounter: Secondary | ICD-10-CM

## 2020-01-05 DIAGNOSIS — Y9389 Activity, other specified: Secondary | ICD-10-CM | POA: Diagnosis not present

## 2020-01-05 DIAGNOSIS — I10 Essential (primary) hypertension: Secondary | ICD-10-CM | POA: Diagnosis not present

## 2020-01-05 DIAGNOSIS — R29898 Other symptoms and signs involving the musculoskeletal system: Secondary | ICD-10-CM | POA: Diagnosis not present

## 2020-01-05 DIAGNOSIS — R4189 Other symptoms and signs involving cognitive functions and awareness: Secondary | ICD-10-CM | POA: Diagnosis not present

## 2020-01-05 DIAGNOSIS — R55 Syncope and collapse: Secondary | ICD-10-CM

## 2020-01-05 DIAGNOSIS — M19072 Primary osteoarthritis, left ankle and foot: Secondary | ICD-10-CM | POA: Diagnosis not present

## 2020-01-05 DIAGNOSIS — S52502A Unspecified fracture of the lower end of left radius, initial encounter for closed fracture: Secondary | ICD-10-CM | POA: Diagnosis not present

## 2020-01-05 DIAGNOSIS — Z79899 Other long term (current) drug therapy: Secondary | ICD-10-CM | POA: Diagnosis not present

## 2020-01-05 DIAGNOSIS — W19XXXA Unspecified fall, initial encounter: Secondary | ICD-10-CM | POA: Insufficient documentation

## 2020-01-05 DIAGNOSIS — I959 Hypotension, unspecified: Secondary | ICD-10-CM

## 2020-01-05 DIAGNOSIS — S52572A Other intraarticular fracture of lower end of left radius, initial encounter for closed fracture: Secondary | ICD-10-CM | POA: Diagnosis not present

## 2020-01-05 DIAGNOSIS — Y998 Other external cause status: Secondary | ICD-10-CM | POA: Diagnosis not present

## 2020-01-05 DIAGNOSIS — E119 Type 2 diabetes mellitus without complications: Secondary | ICD-10-CM | POA: Diagnosis not present

## 2020-01-05 DIAGNOSIS — M25462 Effusion, left knee: Secondary | ICD-10-CM | POA: Diagnosis not present

## 2020-01-05 DIAGNOSIS — M79642 Pain in left hand: Secondary | ICD-10-CM | POA: Diagnosis not present

## 2020-01-05 DIAGNOSIS — Z7901 Long term (current) use of anticoagulants: Secondary | ICD-10-CM | POA: Diagnosis not present

## 2020-01-05 DIAGNOSIS — R600 Localized edema: Secondary | ICD-10-CM | POA: Diagnosis not present

## 2020-01-05 DIAGNOSIS — R52 Pain, unspecified: Secondary | ICD-10-CM | POA: Diagnosis not present

## 2020-01-05 DIAGNOSIS — S0990XA Unspecified injury of head, initial encounter: Secondary | ICD-10-CM | POA: Diagnosis present

## 2020-01-05 DIAGNOSIS — R001 Bradycardia, unspecified: Secondary | ICD-10-CM

## 2020-01-05 DIAGNOSIS — M79672 Pain in left foot: Secondary | ICD-10-CM | POA: Insufficient documentation

## 2020-01-05 DIAGNOSIS — M25562 Pain in left knee: Secondary | ICD-10-CM | POA: Insufficient documentation

## 2020-01-05 DIAGNOSIS — R4020 Unspecified coma: Secondary | ICD-10-CM | POA: Diagnosis not present

## 2020-01-05 DIAGNOSIS — R58 Hemorrhage, not elsewhere classified: Secondary | ICD-10-CM | POA: Diagnosis not present

## 2020-01-05 DIAGNOSIS — Y9289 Other specified places as the place of occurrence of the external cause: Secondary | ICD-10-CM | POA: Diagnosis not present

## 2020-01-05 DIAGNOSIS — M25532 Pain in left wrist: Secondary | ICD-10-CM | POA: Diagnosis not present

## 2020-01-05 LAB — CBC
HCT: 34.7 % — ABNORMAL LOW (ref 36.0–46.0)
Hemoglobin: 11.4 g/dL — ABNORMAL LOW (ref 12.0–15.0)
MCH: 32.7 pg (ref 26.0–34.0)
MCHC: 32.9 g/dL (ref 30.0–36.0)
MCV: 99.4 fL (ref 80.0–100.0)
Platelets: 159 10*3/uL (ref 150–400)
RBC: 3.49 MIL/uL — ABNORMAL LOW (ref 3.87–5.11)
RDW: 12.5 % (ref 11.5–15.5)
WBC: 6.2 10*3/uL (ref 4.0–10.5)
nRBC: 0 % (ref 0.0–0.2)

## 2020-01-05 LAB — SAMPLE TO BLOOD BANK

## 2020-01-05 LAB — COMPREHENSIVE METABOLIC PANEL
ALT: 14 U/L (ref 0–44)
AST: 19 U/L (ref 15–41)
Albumin: 3.8 g/dL (ref 3.5–5.0)
Alkaline Phosphatase: 52 U/L (ref 38–126)
Anion gap: 14 (ref 5–15)
BUN: 23 mg/dL (ref 8–23)
CO2: 23 mmol/L (ref 22–32)
Calcium: 9.3 mg/dL (ref 8.9–10.3)
Chloride: 102 mmol/L (ref 98–111)
Creatinine, Ser: 1.18 mg/dL — ABNORMAL HIGH (ref 0.44–1.00)
GFR calc Af Amer: 48 mL/min — ABNORMAL LOW (ref >60)
GFR calc non Af Amer: 41 mL/min — ABNORMAL LOW (ref >60)
Glucose, Bld: 151 mg/dL — ABNORMAL HIGH (ref 70–99)
Potassium: 4 mmol/L (ref 3.5–5.1)
Sodium: 139 mmol/L (ref 135–145)
Total Bilirubin: 1.4 mg/dL — ABNORMAL HIGH (ref 0.3–1.2)
Total Protein: 5.9 g/dL — ABNORMAL LOW (ref 6.5–8.1)

## 2020-01-05 LAB — I-STAT CHEM 8, ED
BUN: 24 mg/dL — ABNORMAL HIGH (ref 8–23)
Calcium, Ion: 1.13 mmol/L — ABNORMAL LOW (ref 1.15–1.40)
Chloride: 103 mmol/L (ref 98–111)
Creatinine, Ser: 1.2 mg/dL — ABNORMAL HIGH (ref 0.44–1.00)
Glucose, Bld: 149 mg/dL — ABNORMAL HIGH (ref 70–99)
HCT: 32 % — ABNORMAL LOW (ref 36.0–46.0)
Hemoglobin: 10.9 g/dL — ABNORMAL LOW (ref 12.0–15.0)
Potassium: 4.1 mmol/L (ref 3.5–5.1)
Sodium: 137 mmol/L (ref 135–145)
TCO2: 23 mmol/L (ref 22–32)

## 2020-01-05 LAB — PROTIME-INR
INR: 1.1 (ref 0.8–1.2)
Prothrombin Time: 14.1 seconds (ref 11.4–15.2)

## 2020-01-05 LAB — URINALYSIS, ROUTINE W REFLEX MICROSCOPIC
Bilirubin Urine: NEGATIVE
Glucose, UA: 50 mg/dL — AB
Hgb urine dipstick: NEGATIVE
Ketones, ur: NEGATIVE mg/dL
Leukocytes,Ua: NEGATIVE
Nitrite: NEGATIVE
Protein, ur: NEGATIVE mg/dL
Specific Gravity, Urine: 1.009 (ref 1.005–1.030)
pH: 5 (ref 5.0–8.0)

## 2020-01-05 LAB — LACTIC ACID, PLASMA: Lactic Acid, Venous: 0.9 mmol/L (ref 0.5–1.9)

## 2020-01-05 MED ORDER — FENTANYL CITRATE (PF) 100 MCG/2ML IJ SOLN
50.0000 ug | Freq: Once | INTRAMUSCULAR | Status: AC
  Administered 2020-01-05: 50 ug via INTRAVENOUS
  Filled 2020-01-05: qty 2

## 2020-01-05 NOTE — ED Provider Notes (Addendum)
[facility]    CSN: [id number] Arrival date & time: 01/05/20  1604      History   Chief Complaint Chief Complaint  Patient presents with  . Loss of Consciousness    HPI [name] is a 86 y.o. female  Extensive medical history as outlined below presenting for syncopal event that occurred outside of urgent care facility.  Has been was witness: Endorsing head trauma.  Wife is on blood thinners.  History limited due to patient's clinical status.    Past Medical History:  Diagnosis Date  . Diabetes mellitus without complication (HCC)   . Diverticulitis   . Hypercholesterolemia   . Hypertension   . Shingles     Patient Active Problem List   Diagnosis Date Noted  . Syncope 05/03/2018  . AF (paroxysmal atrial fibrillation) (HCC) 05/03/2018  . Lower GI bleeding 12/17/2017  . Lower GI bleed 07/31/2012  . DM2 (diabetes mellitus, type 2) (HCC) 07/31/2012  . HTN (hypertension) 07/31/2012    Past Surgical History:  Procedure Laterality Date  . ABDOMINAL HYSTERECTOMY    . COLONOSCOPY    . TOTAL HIP ARTHROPLASTY  2005   left sided    OB History   No obstetric history on file.      Home Medications    Prior to Admission medications   Medication Sig Start Date End Date Taking? Authorizing Provider  acetaminophen (TYLENOL) 500 MG tablet Take 500 mg by mouth every 6 (six) hours as needed.     [provider]  apixaban (ELIQUIS) 2.5 MG TABS tablet Take 1 tablet (2.5 mg total) by mouth 2 (two) times daily. Appointment Required For Further Refills [phone] 10/30/19   Newman Nip, NP  brimonidine-timolol (COMBIGAN) 0.2-0.5 % ophthalmic solution Place 1 drop into both eyes every 12 (twelve) hours.    [provider]  furosemide (LASIX) 20 MG tablet TAKE HALF TABLET BY MOUTH EVERY MONDAY, WEDNESDAY, AND FRIDAY Patient not taking: Reported on 12/29/2019 10/26/19   Newman Nip, NP  influenza vaccine adjuvanted (FLUAD QUADRIVALENT) 0.5  ML injection [facility] 2020-2021(70yr up)(PF) 60 mcg (15 mcg x 4)/0.70mL IM syringe  ADM 0.5ML IM UTD    [provider]  LUMIGAN 0.01 % SOLN Place 1 drop into both eyes at bedtime. 11/10/17   [provider]  metoprolol tartrate (LOPRESSOR) 25 MG tablet Take 0.5 tablets (12.5 mg total) by mouth 2 (two) times daily. 07/18/19   Newman Nip, NP  Multiple Vitamins-Minerals (CENTRUM SILVER 50+WOMEN) TABS Take 1 tablet by mouth daily.    [provider]  nepafenac (ILEVRO) 0.3 % ophthalmic suspension Place 1 drop into both eyes 2 (two) times daily. 11/20/19   [provider]  simvastatin (ZOCOR) 20 MG tablet Take 20 mg by mouth daily.     [provider]    Family History Family History  Problem Relation Age of Onset  . CAD Father     Social History Social History   Tobacco Use  . Smoking status: Never Smoker  . Smokeless tobacco: Never Used  Substance Use Topics  . Alcohol use: Yes    Alcohol/week: 2.0 standard drinks    Types: 2 Standard drinks or equivalent per week    Comment: occassionally  . Drug use: No     Allergies   Penicillins   Review of Systems Limited 2/2 clinical status   Physical Exam Triage Vital Signs ED Triage Vitals [01/05/20 1633]  Enc Vitals Group  BP (!) 86/53     Pulse Rate (!) 52     Resp 14     Temp      Temp src      SpO2 91 %     Weight      Height      Head Circumference      Peak Flow      Pain Score      Pain Loc      Pain Edu?      Excl. in GC?    No data found.  Updated Vital Signs BP (!) 86/53   Pulse (!) 52   Resp 14   SpO2 91%   Visual Acuity Right Eye Distance:   Left Eye Distance:   Bilateral Distance:    Right Eye Near:   Left Eye Near:    Bilateral Near:     Physical Exam Constitutional:      General: She is in acute distress.     Appearance: She is normal weight. She is ill-appearing.  HENT:     Head: Normocephalic and atraumatic.     Mouth/Throat:      Mouth: Mucous membranes are moist.     Comments: Blood in mouth, left anterior tongue Eyes:     General: No scleral icterus.    Conjunctiva/sclera: Conjunctivae normal.  Cardiovascular:     Rate and Rhythm: Regular rhythm. Bradycardia present.  Pulmonary:     Effort: Pulmonary effort is normal. No respiratory distress.     Breath sounds: No wheezing.  Skin:    Coloration: Skin is not jaundiced or pale.  Neurological:     Mental Status: She is alert. She is disoriented.     GCS: GCS eye subscore is 2. GCS verbal subscore is 2. GCS motor subscore is 4.     Cranial Nerves: Cranial nerve deficit present.     Motor: Weakness present.     Comments: Gross deficits.  Patient somewhat responsive to painful stimuli.      UC Treatments / Results  Labs (all labs ordered are listed, but only abnormal results are displayed) Labs Reviewed - No data to display  EKG   Radiology No results found.  Procedures Procedures (including critical care time)  Medications Ordered in UC Medications - No data to display  Initial Impression / Assessment and Plan / UC Course  I have reviewed the triage vital signs and the nursing notes.  Pertinent labs & imaging results that were available during my care of the patient were reviewed by me and considered in my medical decision making (see chart for details).     Patient hypotensive, bradycardic s/p syncopal VS ?seizure vs TIA.  CBG 91.  EKG in office, compared to previous from 12/29/2019: Sinus bradycardia with ventricular rate 54 bpm.  No QTC prolongation.  Waveforms stable in all leads: Nonacute.  GCS 8 on arrival with improvement at time of discharge with paramedics.  Transported to [facility] for further evaluation/management. Final Clinical Impressions(s) / UC Diagnoses   Final diagnoses:  Syncope and collapse  Hypotension, unspecified hypotension type  Bradycardia  Unresponsive episode   Discharge Instructions   None    ED Prescriptions     None     PDMP not reviewed this encounter.      Hall[place]Potvin, [place], [place] 01/05/20 1656

## 2020-01-05 NOTE — ED Triage Notes (Signed)
Pt brought [facility] after she had a LOC and fell hitting the back of her head, initial BP 80/40 some confusion on  Triage AO x 3  Deformity noticed right wrist , pain on right wrist and right foot. Pt is on blood thinner, DR. Trifan notified pt on triage sent by CN.

## 2020-01-05 NOTE — Care Management (Signed)
ED CM discussed possible HH services with EDP who agreed that patient may benefit from [facility], but patient was already discharge, ED CM will follow up with the patient tomorrow.

## 2020-01-05 NOTE — Progress Notes (Signed)
   01/05/20 1800  Clinical Encounter Type  Visited With Patient  Visit Type Trauma  Referral From Nurse  Consult/Referral To Chaplain   Chaplain called to check in. No need for chaplain at this time. Chaplain remains available as needed.  This note was prepared by Chaplain Resident, Tacy Learn, MDiv. Chaplain remains available as needed through the on-call pager: [phone].

## 2020-01-05 NOTE — ED Notes (Signed)
Ortho tech notified about splint need.

## 2020-01-05 NOTE — ED Provider Notes (Signed)
[facility] EMERGENCY DEPARTMENT Provider Note   CSN: [id number] Arrival date & time: 01/05/20  1750     History Chief Complaint  Patient presents with  . Fall    on blood thinner    [name] is a 86 y.o. female.  Patient comments on a mechanical fall while going up the steps of the bank, fall backwards and hit her head.  She does not feel that she lost consciousness.  She has mild head discomfort, she is claiming no weakness no sensation loss, no nausea vomiting no chest pain no shortness of breath no abdominal pain.  She complains of left wrist pain and left foot and knee pain.  Been ambulatory since she is on blood thinners        Past Medical History:  Diagnosis Date  . Diabetes mellitus without complication (HCC)   . Diverticulitis   . Hypercholesterolemia   . Hypertension   . Shingles     Patient Active Problem List   Diagnosis Date Noted  . Syncope 05/03/2018  . AF (paroxysmal atrial fibrillation) (HCC) 05/03/2018  . Lower GI bleeding 12/17/2017  . Lower GI bleed 07/31/2012  . DM2 (diabetes mellitus, type 2) (HCC) 07/31/2012  . HTN (hypertension) 07/31/2012    Past Surgical History:  Procedure Laterality Date  . ABDOMINAL HYSTERECTOMY    . COLONOSCOPY    . TOTAL HIP ARTHROPLASTY  2005   left sided     OB History   No obstetric history on file.     Family History  Problem Relation Age of Onset  . CAD Father     Social History   Tobacco Use  . Smoking status: Never Smoker  . Smokeless tobacco: Never Used  Substance Use Topics  . Alcohol use: Yes    Alcohol/week: 2.0 standard drinks    Types: 2 Standard drinks or equivalent per week    Comment: occassionally  . Drug use: No    Home Medications Prior to Admission medications   Medication Sig Start Date End Date Taking? Authorizing Provider  apixaban (ELIQUIS) 2.5 MG TABS tablet Take 1 tablet (2.5 mg total) by mouth 2 (two) times daily. Appointment Required For  Further Refills [phone] Patient taking differently: Take 2.5 mg by mouth 2 (two) times daily.  10/30/19  Yes Newman Nip, NP  acetaminophen (TYLENOL) 500 MG tablet Take 500 mg by mouth every 6 (six) hours as needed.     [provider]  brimonidine-timolol (COMBIGAN) 0.2-0.5 % ophthalmic solution Place 1 drop into both eyes every 12 (twelve) hours.    [provider]  furosemide (LASIX) 20 MG tablet TAKE HALF TABLET BY MOUTH EVERY MONDAY, WEDNESDAY, AND FRIDAY 10/26/19   Newman Nip, NP  influenza vaccine adjuvanted (FLUAD QUADRIVALENT) 0.5 ML injection Fluad Quad 2020-2021(46yr up)(PF) 60 mcg (15 mcg x 4)/0.77mL IM syringe  ADM 0.5ML IM UTD    [provider]  LUMIGAN 0.01 % SOLN Place 1 drop into both eyes at bedtime. 11/10/17   [provider]  metoprolol tartrate (LOPRESSOR) 25 MG tablet Take 0.5 tablets (12.5 mg total) by mouth 2 (two) times daily. 07/18/19   Newman Nip, NP  Multiple Vitamins-Minerals (CENTRUM SILVER 50+WOMEN) TABS Take 1 tablet by mouth daily.    [provider]  nepafenac (ILEVRO) 0.3 % ophthalmic suspension Place 1 drop into both eyes 2 (two) times daily. 11/20/19   [provider]  simvastatin (ZOCOR) 20 MG tablet Take 20 mg  by mouth daily.     [provider]    Allergies    Penicillins  Review of Systems   Review of Systems  Constitutional: Negative for chills and fever.  HENT: Negative for congestion and rhinorrhea.   Respiratory: Negative for cough and shortness of breath.   Cardiovascular: Negative for chest pain and palpitations.  Gastrointestinal: Negative for diarrhea, nausea and vomiting.  Genitourinary: Negative for difficulty urinating and dysuria.  Musculoskeletal: Positive for arthralgias and joint swelling. Negative for back pain.  Skin: Negative for rash and wound.  Neurological: Negative for light-headedness and headaches.    Physical Exam Updated Vital Signs BP (!)  165/59   Pulse 76   Temp 98.6 F (37 C)   Resp 18   Ht 5\' 2"  (1.575 m)   Wt 61.9 kg   SpO2 96%   BMI 24.96 kg/m   Physical Exam Vitals and nursing note reviewed. Exam conducted with a chaperone present.  Constitutional:      General: She is not in acute distress.    Appearance: Normal appearance.  HENT:     Head: Normocephalic.     Comments: Mild area of contusion to the occiput no depression no crepitus minimal tenderness    Nose: No rhinorrhea.  Eyes:     General:        Right eye: No discharge.        Left eye: No discharge.     Conjunctiva/sclera: Conjunctivae normal.     Pupils: Pupils are equal, round, and reactive to light.  Cardiovascular:     Rate and Rhythm: Regular rhythm. Tachycardia present.  Pulmonary:     Effort: Pulmonary effort is normal. No respiratory distress.     Breath sounds: No stridor. No wheezing or rales.  Abdominal:     General: Abdomen is flat. There is no distension.     Palpations: Abdomen is soft.     Tenderness: There is no abdominal tenderness.  Musculoskeletal:        General: Swelling and tenderness present. No signs of injury.     Comments: Swelling to the dorsal surface of the left distal forearm tenderness to palpation neurovascularly intact  Skin:    General: Skin is warm and dry.  Neurological:     General: No focal deficit present.     Mental Status: She is alert and oriented to person, place, and time. Mental status is at baseline.     Cranial Nerves: No cranial nerve deficit.     Sensory: No sensory deficit.     Motor: No weakness.     Coordination: Coordination normal.     Gait: Gait normal.  Psychiatric:        Mood and Affect: Mood normal.        Behavior: Behavior normal.    ED Results / Procedures / Treatments   Labs (all labs ordered are listed, but only abnormal results are displayed) Labs Reviewed  COMPREHENSIVE METABOLIC PANEL - Abnormal; Notable for the following components:      Result Value   Glucose,  Bld 151 (*)    Creatinine, Ser 1.18 (*)    Total Protein 5.9 (*)    Total Bilirubin 1.4 (*)    GFR calc non Af Amer 41 (*)    GFR calc Af Amer 48 (*)    All other components within normal limits  CBC - Abnormal; Notable for the following components:   RBC 3.49 (*)    Hemoglobin 11.4 (*)  HCT 34.7 (*)    All other components within normal limits  I-STAT CHEM 8, ED - Abnormal; Notable for the following components:   BUN 24 (*)    Creatinine, Ser 1.20 (*)    Glucose, Bld 149 (*)    Calcium, Ion 1.13 (*)    Hemoglobin 10.9 (*)    HCT 32.0 (*)    All other components within normal limits  LACTIC ACID, PLASMA  PROTIME-INR  ETHANOL  URINALYSIS, ROUTINE W REFLEX MICROSCOPIC  SAMPLE TO BLOOD BANK    EKG EKG Interpretation  Date/Time:  Friday January 05 2020 17:24:55 EDT Ventricular Rate:  63 PR Interval:  180 QRS Duration: 80 QT Interval:  402 QTC Calculation: 411 R Axis:   0 Text Interpretation: Normal sinus rhythm Left ventricular hypertrophy with repolarization abnormality ( R in aVL ) Abnormal ECG Confirmed by Cherlynn Perches ([id number]) on 01/05/2020 8:24:10 PM   Radiology DG Wrist Complete Left  Result Date: 01/05/2020 CLINICAL DATA:  Fall on steps walking into the bank. Left hand and wrist pain. Left knee pain. Left foot pain. EXAM: LEFT WRIST - COMPLETE 3+ VIEW COMPARISON:  None. FINDINGS: Mildly displaced distal radius fracture primarily involving the radial styloid as well as volar surface. Fracture extends to the radiocarpal joint. No definite distal radioulnar joint involvement. There is no ulna styloid fracture. Moderate wrist osteoarthritis. Rounded soft tissue calcification projecting distal to the ulna styloid appears chronic. There is generalized soft tissue edema. IMPRESSION: Mildly displaced distal radius fracture with radiocarpal extension. Electronically Signed   By: Narda Rutherford M.D.   On: 01/05/2020 18:52   CT Head Wo Contrast  Result Date:  01/05/2020 CLINICAL DATA:  Syncope, facial droop, left-sided weakness EXAM: CT HEAD WITHOUT CONTRAST TECHNIQUE: Contiguous axial images were obtained from the base of the skull through the vertex without intravenous contrast. COMPARISON:  05/03/2018 FINDINGS: Brain: Stable hypodensities in the periventricular white matter consistent with chronic small vessel ischemic change. No signs of acute infarct or hemorrhage. Lateral ventricles and remaining midline structures are unremarkable. No acute extra-axial fluid collections. No mass effect. Vascular: No hyperdense vessel or unexpected calcification. Skull: Normal. Negative for fracture or focal lesion. Sinuses/Orbits: No acute finding. Other: None. IMPRESSION: 1. Stable head CT, no acute process. Electronically Signed   By: Sharlet Salina M.D.   On: 01/05/2020 19:51   DG Knee Complete 4 Views Left  Result Date: 01/05/2020 CLINICAL DATA:  Fall on steps walking into the bank. Left hand and wrist pain. Left knee pain. Left foot pain. EXAM: LEFT KNEE - COMPLETE 4+ VIEW COMPARISON:  None. FINDINGS: No evidence of fracture or dislocation. Mild medial tibiofemoral joint space narrowing. Mild tricompartmental peripheral spurs. Small knee joint effusion without lipohemarthrosis. No focal soft tissue abnormality. IMPRESSION: Mild tricompartmental osteoarthritis with small joint effusion. No acute fracture or subluxation. Electronically Signed   By: Narda Rutherford M.D.   On: 01/05/2020 18:54   DG Hand Complete Left  Result Date: 01/05/2020 CLINICAL DATA:  Fall on steps walking into the bank. Left hand and wrist pain. Left knee pain. Left foot pain. EXAM: LEFT HAND - COMPLETE 3+ VIEW COMPARISON:  None. FINDINGS: Distal radius fracture is better assessed on concurrent wrist exam. There is no additional fracture of the hand. Multifocal osteoarthritis, prominently involving the thumb carpal metacarpal joint. Diffuse bony under mineralization. No dislocation. IMPRESSION: 1.  Distal radius fracture is better assessed on concurrent wrist exam. No additional fracture of the hand. 2. Multifocal osteoarthritis, advanced involving the  thumb carpal metacarpal joint. Electronically Signed   By: Narda RutherfordMelanie  Sanford M.D.   On: 01/05/2020 18:51   DG Foot Complete Left  Result Date: 01/05/2020 CLINICAL DATA:  Fall on steps walking into the bank. Left hand and wrist pain. Left knee pain. Left foot pain. EXAM: LEFT FOOT - COMPLETE 3+ VIEW COMPARISON:  None. FINDINGS: There is no evidence of fracture or dislocation. Mild osteoarthritis involving the midfoot, with mild to moderate osteoarthritis of the digits. Mild hallux valgus and degenerative change of the first metatarsal phalangeal joint. Plantar calcaneal spur and Achilles tendon enthesophyte. There are vascular calcifications. Soft tissues are unremarkable. IMPRESSION: 1. No fracture or subluxation of the left foot. 2. Mild to moderate osteoarthritis. Electronically Signed   By: Narda RutherfordMelanie  Sanford M.D.   On: 01/05/2020 18:53    Procedures Procedures (including critical care time)  Medications Ordered in ED Medications  fentaNYL (SUBLIMAZE) injection 50 mcg (has no administration in time range)    ED Course  I have reviewed the triage vital signs and the nursing notes.  Pertinent labs & imaging results that were available during my care of the patient were reviewed by me and considered in my medical decision making (see chart for details).    MDM Rules/Calculators/A&P                         Mechanical fall from standing, no loss of consciousness on blood thinners, level 2 trauma.  Upon arrival patient's airway is intact with bilateral breath sounds.  She is taken immediately for imaging.  She is hemodynamically stable.  She comes to us from urgent care where they comment on a altered GCS and low blood pressure however the patient is mentating normally and can recount the entire episode.  She has musculoskeletal tenderness as  described, we will CT scan her head.  Her C-spine is cleared by [employer] and [place] rules.  ECG was performed and shows sinus rhythm no acute ischemic change interval abnormality or arrhythmia.  I reviewed compared to previous is unchanged.  X-ray imaging reviewed by myself and radiology shows chronic arthritic changes as well as a left distal radius intra-articular fracture with minimal displacement, again she is neurovascularly intact no open wounds, I consulted Dr. Merlyn LotKuzma on call for hand he said he would work her into his clinic and he agrees with the sugar tong splint, consulted our orthopedic technician to place sugar tong splint.  CT imaging reviewed by myself and radiology shows no acute intracranial process, she is safe for discharge home.  Strict return precautions are given   Final Clinical Impression(s) / ED Diagnoses Final diagnoses:  Trauma  Other closed intra-articular fracture of distal end of left radius, initial encounter    Rx / DC Orders ED Discharge Orders    None       Sabino DonovanKatz, Tanner Yeley C, MD 01/05/20 2024

## 2020-01-05 NOTE — Discharge Instructions (Signed)
You can take 600 mg of ibuprofen every 6 hours, you can take 1000 mg of Tylenol every 6 hours, you can alternate these every 3 or you can take them together.  

## 2020-01-05 NOTE — ED Triage Notes (Signed)
Pts husband comes in UC states wife passed out in car. Pt alert to self on arrival. Pt unable to bare weight. Pt has facial drooping, positive lt drift. Lt side weakness notified.

## 2020-01-05 NOTE — ED Notes (Signed)
Patient verbalizes understanding of discharge instructions. Opportunity for questioning and answers were provided. Armband removed by staff, pt discharged from ED via wheelchair with family.  

## 2020-01-05 NOTE — Progress Notes (Signed)
Orthopedic Tech Progress Note Patient Details:  [name] 1928-11-04 [id number]  Ortho Devices Type of Ortho Device: Sugartong splint, Arm sling Ortho Device/Splint Location: LUE Ortho Device/Splint Interventions: Ordered, Application, Adjustment   Post Interventions Patient Tolerated: Well Instructions Provided: Care of device, Poper ambulation with device   Tevin  Richardson 01/05/2020, 8:38 PM

## 2020-01-06 ENCOUNTER — Telehealth: Payer: Self-pay | Admitting: Surgery

## 2020-01-06 DIAGNOSIS — R413 Other amnesia: Secondary | ICD-10-CM | POA: Diagnosis not present

## 2020-01-06 DIAGNOSIS — T50901A Poisoning by unspecified drugs, medicaments and biological substances, accidental (unintentional), initial encounter: Secondary | ICD-10-CM | POA: Diagnosis not present

## 2020-01-06 NOTE — Telephone Encounter (Signed)
ED CM attempted to contact patient and family concerning recommendations for [facility] services, no answer CM will make a second attempt tomorrow.

## 2020-01-08 ENCOUNTER — Telehealth: Payer: Self-pay | Admitting: Surgery

## 2020-01-08 ENCOUNTER — Other Ambulatory Visit (HOSPITAL_COMMUNITY): Payer: Self-pay | Admitting: Nurse Practitioner

## 2020-01-08 DIAGNOSIS — Z9181 History of falling: Secondary | ICD-10-CM | POA: Diagnosis not present

## 2020-01-08 DIAGNOSIS — S52572D Other intraarticular fracture of lower end of left radius, subsequent encounter for closed fracture with routine healing: Secondary | ICD-10-CM | POA: Diagnosis not present

## 2020-01-08 DIAGNOSIS — Z96642 Presence of left artificial hip joint: Secondary | ICD-10-CM | POA: Diagnosis not present

## 2020-01-08 DIAGNOSIS — K5790 Diverticulosis of intestine, part unspecified, without perforation or abscess without bleeding: Secondary | ICD-10-CM | POA: Diagnosis not present

## 2020-01-08 DIAGNOSIS — E7800 Pure hypercholesterolemia, unspecified: Secondary | ICD-10-CM | POA: Diagnosis not present

## 2020-01-08 DIAGNOSIS — I10 Essential (primary) hypertension: Secondary | ICD-10-CM | POA: Diagnosis not present

## 2020-01-08 DIAGNOSIS — Z7901 Long term (current) use of anticoagulants: Secondary | ICD-10-CM | POA: Diagnosis not present

## 2020-01-08 DIAGNOSIS — E119 Type 2 diabetes mellitus without complications: Secondary | ICD-10-CM | POA: Diagnosis not present

## 2020-01-08 DIAGNOSIS — I480 Paroxysmal atrial fibrillation: Secondary | ICD-10-CM | POA: Diagnosis not present

## 2020-01-08 NOTE — Telephone Encounter (Signed)
ED CM contacted patient and spouse to follow up on Ortho Appointment. Patient has appt scheduled with [facility] tomorrow 9/28.  HH services with [facility] started today 9/27.  No further ED Care Coordination needs identified.

## 2020-01-09 DIAGNOSIS — M25532 Pain in left wrist: Secondary | ICD-10-CM | POA: Diagnosis not present

## 2020-01-09 DIAGNOSIS — S52572A Other intraarticular fracture of lower end of left radius, initial encounter for closed fracture: Secondary | ICD-10-CM | POA: Diagnosis not present

## 2020-01-11 DIAGNOSIS — I4891 Unspecified atrial fibrillation: Secondary | ICD-10-CM | POA: Diagnosis not present

## 2020-01-11 DIAGNOSIS — R413 Other amnesia: Secondary | ICD-10-CM | POA: Diagnosis not present

## 2020-01-11 DIAGNOSIS — I10 Essential (primary) hypertension: Secondary | ICD-10-CM | POA: Diagnosis not present

## 2020-01-11 DIAGNOSIS — E1169 Type 2 diabetes mellitus with other specified complication: Secondary | ICD-10-CM | POA: Diagnosis not present

## 2020-01-12 DIAGNOSIS — K5790 Diverticulosis of intestine, part unspecified, without perforation or abscess without bleeding: Secondary | ICD-10-CM | POA: Diagnosis not present

## 2020-01-12 DIAGNOSIS — S52572D Other intraarticular fracture of lower end of left radius, subsequent encounter for closed fracture with routine healing: Secondary | ICD-10-CM | POA: Diagnosis not present

## 2020-01-12 DIAGNOSIS — I10 Essential (primary) hypertension: Secondary | ICD-10-CM | POA: Diagnosis not present

## 2020-01-12 DIAGNOSIS — E7800 Pure hypercholesterolemia, unspecified: Secondary | ICD-10-CM | POA: Diagnosis not present

## 2020-01-12 DIAGNOSIS — Z7901 Long term (current) use of anticoagulants: Secondary | ICD-10-CM | POA: Diagnosis not present

## 2020-01-12 DIAGNOSIS — I480 Paroxysmal atrial fibrillation: Secondary | ICD-10-CM | POA: Diagnosis not present

## 2020-01-12 DIAGNOSIS — E119 Type 2 diabetes mellitus without complications: Secondary | ICD-10-CM | POA: Diagnosis not present

## 2020-01-12 DIAGNOSIS — Z9181 History of falling: Secondary | ICD-10-CM | POA: Diagnosis not present

## 2020-01-12 DIAGNOSIS — Z96642 Presence of left artificial hip joint: Secondary | ICD-10-CM | POA: Diagnosis not present

## 2020-01-17 DIAGNOSIS — Z9181 History of falling: Secondary | ICD-10-CM | POA: Diagnosis not present

## 2020-01-17 DIAGNOSIS — Z7901 Long term (current) use of anticoagulants: Secondary | ICD-10-CM | POA: Diagnosis not present

## 2020-01-17 DIAGNOSIS — I480 Paroxysmal atrial fibrillation: Secondary | ICD-10-CM | POA: Diagnosis not present

## 2020-01-17 DIAGNOSIS — Z96642 Presence of left artificial hip joint: Secondary | ICD-10-CM | POA: Diagnosis not present

## 2020-01-17 DIAGNOSIS — E7800 Pure hypercholesterolemia, unspecified: Secondary | ICD-10-CM | POA: Diagnosis not present

## 2020-01-17 DIAGNOSIS — S52572D Other intraarticular fracture of lower end of left radius, subsequent encounter for closed fracture with routine healing: Secondary | ICD-10-CM | POA: Diagnosis not present

## 2020-01-17 DIAGNOSIS — K5790 Diverticulosis of intestine, part unspecified, without perforation or abscess without bleeding: Secondary | ICD-10-CM | POA: Diagnosis not present

## 2020-01-17 DIAGNOSIS — I10 Essential (primary) hypertension: Secondary | ICD-10-CM | POA: Diagnosis not present

## 2020-01-17 DIAGNOSIS — E119 Type 2 diabetes mellitus without complications: Secondary | ICD-10-CM | POA: Diagnosis not present

## 2020-01-18 DIAGNOSIS — Z9181 History of falling: Secondary | ICD-10-CM | POA: Diagnosis not present

## 2020-01-18 DIAGNOSIS — E7800 Pure hypercholesterolemia, unspecified: Secondary | ICD-10-CM | POA: Diagnosis not present

## 2020-01-18 DIAGNOSIS — E119 Type 2 diabetes mellitus without complications: Secondary | ICD-10-CM | POA: Diagnosis not present

## 2020-01-18 DIAGNOSIS — K5790 Diverticulosis of intestine, part unspecified, without perforation or abscess without bleeding: Secondary | ICD-10-CM | POA: Diagnosis not present

## 2020-01-18 DIAGNOSIS — I10 Essential (primary) hypertension: Secondary | ICD-10-CM | POA: Diagnosis not present

## 2020-01-18 DIAGNOSIS — Z7901 Long term (current) use of anticoagulants: Secondary | ICD-10-CM | POA: Diagnosis not present

## 2020-01-18 DIAGNOSIS — S52572D Other intraarticular fracture of lower end of left radius, subsequent encounter for closed fracture with routine healing: Secondary | ICD-10-CM | POA: Diagnosis not present

## 2020-01-18 DIAGNOSIS — I480 Paroxysmal atrial fibrillation: Secondary | ICD-10-CM | POA: Diagnosis not present

## 2020-01-18 DIAGNOSIS — Z96642 Presence of left artificial hip joint: Secondary | ICD-10-CM | POA: Diagnosis not present

## 2020-01-22 DIAGNOSIS — Z9181 History of falling: Secondary | ICD-10-CM | POA: Diagnosis not present

## 2020-01-22 DIAGNOSIS — K5790 Diverticulosis of intestine, part unspecified, without perforation or abscess without bleeding: Secondary | ICD-10-CM | POA: Diagnosis not present

## 2020-01-22 DIAGNOSIS — I480 Paroxysmal atrial fibrillation: Secondary | ICD-10-CM | POA: Diagnosis not present

## 2020-01-22 DIAGNOSIS — Z96642 Presence of left artificial hip joint: Secondary | ICD-10-CM | POA: Diagnosis not present

## 2020-01-22 DIAGNOSIS — E7800 Pure hypercholesterolemia, unspecified: Secondary | ICD-10-CM | POA: Diagnosis not present

## 2020-01-22 DIAGNOSIS — E119 Type 2 diabetes mellitus without complications: Secondary | ICD-10-CM | POA: Diagnosis not present

## 2020-01-22 DIAGNOSIS — S52572D Other intraarticular fracture of lower end of left radius, subsequent encounter for closed fracture with routine healing: Secondary | ICD-10-CM | POA: Diagnosis not present

## 2020-01-22 DIAGNOSIS — I10 Essential (primary) hypertension: Secondary | ICD-10-CM | POA: Diagnosis not present

## 2020-01-22 DIAGNOSIS — Z7901 Long term (current) use of anticoagulants: Secondary | ICD-10-CM | POA: Diagnosis not present

## 2020-01-23 DIAGNOSIS — Z96642 Presence of left artificial hip joint: Secondary | ICD-10-CM | POA: Diagnosis not present

## 2020-01-23 DIAGNOSIS — K5790 Diverticulosis of intestine, part unspecified, without perforation or abscess without bleeding: Secondary | ICD-10-CM | POA: Diagnosis not present

## 2020-01-23 DIAGNOSIS — Z9181 History of falling: Secondary | ICD-10-CM | POA: Diagnosis not present

## 2020-01-23 DIAGNOSIS — I480 Paroxysmal atrial fibrillation: Secondary | ICD-10-CM | POA: Diagnosis not present

## 2020-01-23 DIAGNOSIS — S52572D Other intraarticular fracture of lower end of left radius, subsequent encounter for closed fracture with routine healing: Secondary | ICD-10-CM | POA: Diagnosis not present

## 2020-01-23 DIAGNOSIS — Z7901 Long term (current) use of anticoagulants: Secondary | ICD-10-CM | POA: Diagnosis not present

## 2020-01-23 DIAGNOSIS — E7800 Pure hypercholesterolemia, unspecified: Secondary | ICD-10-CM | POA: Diagnosis not present

## 2020-01-23 DIAGNOSIS — E119 Type 2 diabetes mellitus without complications: Secondary | ICD-10-CM | POA: Diagnosis not present

## 2020-01-23 DIAGNOSIS — I10 Essential (primary) hypertension: Secondary | ICD-10-CM | POA: Diagnosis not present

## 2020-01-24 DIAGNOSIS — Z7901 Long term (current) use of anticoagulants: Secondary | ICD-10-CM | POA: Diagnosis not present

## 2020-01-24 DIAGNOSIS — Z96642 Presence of left artificial hip joint: Secondary | ICD-10-CM | POA: Diagnosis not present

## 2020-01-24 DIAGNOSIS — K5790 Diverticulosis of intestine, part unspecified, without perforation or abscess without bleeding: Secondary | ICD-10-CM | POA: Diagnosis not present

## 2020-01-24 DIAGNOSIS — I10 Essential (primary) hypertension: Secondary | ICD-10-CM | POA: Diagnosis not present

## 2020-01-24 DIAGNOSIS — E7800 Pure hypercholesterolemia, unspecified: Secondary | ICD-10-CM | POA: Diagnosis not present

## 2020-01-24 DIAGNOSIS — Z9181 History of falling: Secondary | ICD-10-CM | POA: Diagnosis not present

## 2020-01-24 DIAGNOSIS — E119 Type 2 diabetes mellitus without complications: Secondary | ICD-10-CM | POA: Diagnosis not present

## 2020-01-24 DIAGNOSIS — S52572D Other intraarticular fracture of lower end of left radius, subsequent encounter for closed fracture with routine healing: Secondary | ICD-10-CM | POA: Diagnosis not present

## 2020-01-24 DIAGNOSIS — I480 Paroxysmal atrial fibrillation: Secondary | ICD-10-CM | POA: Diagnosis not present

## 2020-01-25 DIAGNOSIS — E119 Type 2 diabetes mellitus without complications: Secondary | ICD-10-CM | POA: Diagnosis not present

## 2020-01-25 DIAGNOSIS — K5790 Diverticulosis of intestine, part unspecified, without perforation or abscess without bleeding: Secondary | ICD-10-CM | POA: Diagnosis not present

## 2020-01-25 DIAGNOSIS — Z7901 Long term (current) use of anticoagulants: Secondary | ICD-10-CM | POA: Diagnosis not present

## 2020-01-25 DIAGNOSIS — I10 Essential (primary) hypertension: Secondary | ICD-10-CM | POA: Diagnosis not present

## 2020-01-25 DIAGNOSIS — S52572D Other intraarticular fracture of lower end of left radius, subsequent encounter for closed fracture with routine healing: Secondary | ICD-10-CM | POA: Diagnosis not present

## 2020-01-25 DIAGNOSIS — E7800 Pure hypercholesterolemia, unspecified: Secondary | ICD-10-CM | POA: Diagnosis not present

## 2020-01-25 DIAGNOSIS — S52572A Other intraarticular fracture of lower end of left radius, initial encounter for closed fracture: Secondary | ICD-10-CM | POA: Diagnosis not present

## 2020-01-25 DIAGNOSIS — Z96642 Presence of left artificial hip joint: Secondary | ICD-10-CM | POA: Diagnosis not present

## 2020-01-25 DIAGNOSIS — Z9181 History of falling: Secondary | ICD-10-CM | POA: Diagnosis not present

## 2020-01-25 DIAGNOSIS — I480 Paroxysmal atrial fibrillation: Secondary | ICD-10-CM | POA: Diagnosis not present

## 2020-01-26 DIAGNOSIS — E7800 Pure hypercholesterolemia, unspecified: Secondary | ICD-10-CM | POA: Diagnosis not present

## 2020-01-26 DIAGNOSIS — Z9181 History of falling: Secondary | ICD-10-CM | POA: Diagnosis not present

## 2020-01-26 DIAGNOSIS — I480 Paroxysmal atrial fibrillation: Secondary | ICD-10-CM | POA: Diagnosis not present

## 2020-01-26 DIAGNOSIS — Z96642 Presence of left artificial hip joint: Secondary | ICD-10-CM | POA: Diagnosis not present

## 2020-01-26 DIAGNOSIS — Z7901 Long term (current) use of anticoagulants: Secondary | ICD-10-CM | POA: Diagnosis not present

## 2020-01-26 DIAGNOSIS — I10 Essential (primary) hypertension: Secondary | ICD-10-CM | POA: Diagnosis not present

## 2020-01-26 DIAGNOSIS — S52572D Other intraarticular fracture of lower end of left radius, subsequent encounter for closed fracture with routine healing: Secondary | ICD-10-CM | POA: Diagnosis not present

## 2020-01-26 DIAGNOSIS — K5790 Diverticulosis of intestine, part unspecified, without perforation or abscess without bleeding: Secondary | ICD-10-CM | POA: Diagnosis not present

## 2020-01-26 DIAGNOSIS — E119 Type 2 diabetes mellitus without complications: Secondary | ICD-10-CM | POA: Diagnosis not present

## 2020-01-29 DIAGNOSIS — Z9181 History of falling: Secondary | ICD-10-CM | POA: Diagnosis not present

## 2020-01-29 DIAGNOSIS — K5790 Diverticulosis of intestine, part unspecified, without perforation or abscess without bleeding: Secondary | ICD-10-CM | POA: Diagnosis not present

## 2020-01-29 DIAGNOSIS — I480 Paroxysmal atrial fibrillation: Secondary | ICD-10-CM | POA: Diagnosis not present

## 2020-01-29 DIAGNOSIS — Z7901 Long term (current) use of anticoagulants: Secondary | ICD-10-CM | POA: Diagnosis not present

## 2020-01-29 DIAGNOSIS — I10 Essential (primary) hypertension: Secondary | ICD-10-CM | POA: Diagnosis not present

## 2020-01-29 DIAGNOSIS — E119 Type 2 diabetes mellitus without complications: Secondary | ICD-10-CM | POA: Diagnosis not present

## 2020-01-29 DIAGNOSIS — S52572D Other intraarticular fracture of lower end of left radius, subsequent encounter for closed fracture with routine healing: Secondary | ICD-10-CM | POA: Diagnosis not present

## 2020-01-29 DIAGNOSIS — Z96642 Presence of left artificial hip joint: Secondary | ICD-10-CM | POA: Diagnosis not present

## 2020-01-29 DIAGNOSIS — E7800 Pure hypercholesterolemia, unspecified: Secondary | ICD-10-CM | POA: Diagnosis not present

## 2020-01-30 ENCOUNTER — Other Ambulatory Visit (HOSPITAL_COMMUNITY): Payer: Self-pay | Admitting: *Deleted

## 2020-01-30 MED ORDER — FUROSEMIDE 20 MG PO TABS
ORAL_TABLET | ORAL | 3 refills | Status: DC
Start: 2020-01-30 — End: 2021-06-03

## 2020-01-31 ENCOUNTER — Telehealth (HOSPITAL_COMMUNITY): Payer: Self-pay | Admitting: *Deleted

## 2020-01-31 DIAGNOSIS — Z96642 Presence of left artificial hip joint: Secondary | ICD-10-CM | POA: Diagnosis not present

## 2020-01-31 DIAGNOSIS — S52572D Other intraarticular fracture of lower end of left radius, subsequent encounter for closed fracture with routine healing: Secondary | ICD-10-CM | POA: Diagnosis not present

## 2020-01-31 DIAGNOSIS — I480 Paroxysmal atrial fibrillation: Secondary | ICD-10-CM | POA: Diagnosis not present

## 2020-01-31 DIAGNOSIS — I10 Essential (primary) hypertension: Secondary | ICD-10-CM | POA: Diagnosis not present

## 2020-01-31 DIAGNOSIS — Z7901 Long term (current) use of anticoagulants: Secondary | ICD-10-CM | POA: Diagnosis not present

## 2020-01-31 DIAGNOSIS — K5790 Diverticulosis of intestine, part unspecified, without perforation or abscess without bleeding: Secondary | ICD-10-CM | POA: Diagnosis not present

## 2020-01-31 DIAGNOSIS — Z9181 History of falling: Secondary | ICD-10-CM | POA: Diagnosis not present

## 2020-01-31 DIAGNOSIS — E7800 Pure hypercholesterolemia, unspecified: Secondary | ICD-10-CM | POA: Diagnosis not present

## 2020-01-31 DIAGNOSIS — E119 Type 2 diabetes mellitus without complications: Secondary | ICD-10-CM | POA: Diagnosis not present

## 2020-01-31 NOTE — Telephone Encounter (Signed)
pts daughter, [name], called stating her mother is having early stages of dementia which they feel like is exacerbated by the metoprolol. The patient lost the bottle of metoprolol earlier this weekend and they have noticed improvement in her "brain fog" and fatigue with no metoprolol.  Discussed with Rudi Coco NP she can stop metoprolol and monitor her HRs. As long as HRs are controlled will keep off rate control at this point.  Daughter will call if issues arise.

## 2020-02-01 DIAGNOSIS — Z7901 Long term (current) use of anticoagulants: Secondary | ICD-10-CM | POA: Diagnosis not present

## 2020-02-01 DIAGNOSIS — I480 Paroxysmal atrial fibrillation: Secondary | ICD-10-CM | POA: Diagnosis not present

## 2020-02-01 DIAGNOSIS — I10 Essential (primary) hypertension: Secondary | ICD-10-CM | POA: Diagnosis not present

## 2020-02-01 DIAGNOSIS — E668 Other obesity: Secondary | ICD-10-CM | POA: Diagnosis not present

## 2020-02-01 DIAGNOSIS — Z96642 Presence of left artificial hip joint: Secondary | ICD-10-CM | POA: Diagnosis not present

## 2020-02-01 DIAGNOSIS — K5790 Diverticulosis of intestine, part unspecified, without perforation or abscess without bleeding: Secondary | ICD-10-CM | POA: Diagnosis not present

## 2020-02-01 DIAGNOSIS — S52572D Other intraarticular fracture of lower end of left radius, subsequent encounter for closed fracture with routine healing: Secondary | ICD-10-CM | POA: Diagnosis not present

## 2020-02-01 DIAGNOSIS — E1169 Type 2 diabetes mellitus with other specified complication: Secondary | ICD-10-CM | POA: Diagnosis not present

## 2020-02-01 DIAGNOSIS — E039 Hypothyroidism, unspecified: Secondary | ICD-10-CM | POA: Diagnosis not present

## 2020-02-01 DIAGNOSIS — Z9181 History of falling: Secondary | ICD-10-CM | POA: Diagnosis not present

## 2020-02-01 DIAGNOSIS — E7800 Pure hypercholesterolemia, unspecified: Secondary | ICD-10-CM | POA: Diagnosis not present

## 2020-02-01 DIAGNOSIS — E119 Type 2 diabetes mellitus without complications: Secondary | ICD-10-CM | POA: Diagnosis not present

## 2020-02-02 DIAGNOSIS — E119 Type 2 diabetes mellitus without complications: Secondary | ICD-10-CM | POA: Diagnosis not present

## 2020-02-02 DIAGNOSIS — Z96642 Presence of left artificial hip joint: Secondary | ICD-10-CM | POA: Diagnosis not present

## 2020-02-02 DIAGNOSIS — E7800 Pure hypercholesterolemia, unspecified: Secondary | ICD-10-CM | POA: Diagnosis not present

## 2020-02-02 DIAGNOSIS — Z7901 Long term (current) use of anticoagulants: Secondary | ICD-10-CM | POA: Diagnosis not present

## 2020-02-02 DIAGNOSIS — I480 Paroxysmal atrial fibrillation: Secondary | ICD-10-CM | POA: Diagnosis not present

## 2020-02-02 DIAGNOSIS — I10 Essential (primary) hypertension: Secondary | ICD-10-CM | POA: Diagnosis not present

## 2020-02-02 DIAGNOSIS — Z9181 History of falling: Secondary | ICD-10-CM | POA: Diagnosis not present

## 2020-02-02 DIAGNOSIS — S52572D Other intraarticular fracture of lower end of left radius, subsequent encounter for closed fracture with routine healing: Secondary | ICD-10-CM | POA: Diagnosis not present

## 2020-02-02 DIAGNOSIS — K5790 Diverticulosis of intestine, part unspecified, without perforation or abscess without bleeding: Secondary | ICD-10-CM | POA: Diagnosis not present

## 2020-02-05 DIAGNOSIS — E7800 Pure hypercholesterolemia, unspecified: Secondary | ICD-10-CM | POA: Diagnosis not present

## 2020-02-05 DIAGNOSIS — S52572D Other intraarticular fracture of lower end of left radius, subsequent encounter for closed fracture with routine healing: Secondary | ICD-10-CM | POA: Diagnosis not present

## 2020-02-05 DIAGNOSIS — E119 Type 2 diabetes mellitus without complications: Secondary | ICD-10-CM | POA: Diagnosis not present

## 2020-02-05 DIAGNOSIS — Z9181 History of falling: Secondary | ICD-10-CM | POA: Diagnosis not present

## 2020-02-05 DIAGNOSIS — I10 Essential (primary) hypertension: Secondary | ICD-10-CM | POA: Diagnosis not present

## 2020-02-05 DIAGNOSIS — I480 Paroxysmal atrial fibrillation: Secondary | ICD-10-CM | POA: Diagnosis not present

## 2020-02-05 DIAGNOSIS — Z96642 Presence of left artificial hip joint: Secondary | ICD-10-CM | POA: Diagnosis not present

## 2020-02-05 DIAGNOSIS — Z7901 Long term (current) use of anticoagulants: Secondary | ICD-10-CM | POA: Diagnosis not present

## 2020-02-05 DIAGNOSIS — K5790 Diverticulosis of intestine, part unspecified, without perforation or abscess without bleeding: Secondary | ICD-10-CM | POA: Diagnosis not present

## 2020-02-07 DIAGNOSIS — I480 Paroxysmal atrial fibrillation: Secondary | ICD-10-CM | POA: Diagnosis not present

## 2020-02-07 DIAGNOSIS — Z9181 History of falling: Secondary | ICD-10-CM | POA: Diagnosis not present

## 2020-02-07 DIAGNOSIS — I10 Essential (primary) hypertension: Secondary | ICD-10-CM | POA: Diagnosis not present

## 2020-02-07 DIAGNOSIS — S52572D Other intraarticular fracture of lower end of left radius, subsequent encounter for closed fracture with routine healing: Secondary | ICD-10-CM | POA: Diagnosis not present

## 2020-02-07 DIAGNOSIS — E7800 Pure hypercholesterolemia, unspecified: Secondary | ICD-10-CM | POA: Diagnosis not present

## 2020-02-07 DIAGNOSIS — E119 Type 2 diabetes mellitus without complications: Secondary | ICD-10-CM | POA: Diagnosis not present

## 2020-02-07 DIAGNOSIS — Z7901 Long term (current) use of anticoagulants: Secondary | ICD-10-CM | POA: Diagnosis not present

## 2020-02-07 DIAGNOSIS — K5790 Diverticulosis of intestine, part unspecified, without perforation or abscess without bleeding: Secondary | ICD-10-CM | POA: Diagnosis not present

## 2020-02-07 DIAGNOSIS — Z96642 Presence of left artificial hip joint: Secondary | ICD-10-CM | POA: Diagnosis not present

## 2020-02-09 DIAGNOSIS — E7800 Pure hypercholesterolemia, unspecified: Secondary | ICD-10-CM | POA: Diagnosis not present

## 2020-02-09 DIAGNOSIS — Z7901 Long term (current) use of anticoagulants: Secondary | ICD-10-CM | POA: Diagnosis not present

## 2020-02-09 DIAGNOSIS — S52572D Other intraarticular fracture of lower end of left radius, subsequent encounter for closed fracture with routine healing: Secondary | ICD-10-CM | POA: Diagnosis not present

## 2020-02-09 DIAGNOSIS — E119 Type 2 diabetes mellitus without complications: Secondary | ICD-10-CM | POA: Diagnosis not present

## 2020-02-09 DIAGNOSIS — I480 Paroxysmal atrial fibrillation: Secondary | ICD-10-CM | POA: Diagnosis not present

## 2020-02-09 DIAGNOSIS — I10 Essential (primary) hypertension: Secondary | ICD-10-CM | POA: Diagnosis not present

## 2020-02-09 DIAGNOSIS — Z9181 History of falling: Secondary | ICD-10-CM | POA: Diagnosis not present

## 2020-02-09 DIAGNOSIS — Z96642 Presence of left artificial hip joint: Secondary | ICD-10-CM | POA: Diagnosis not present

## 2020-02-09 DIAGNOSIS — K5790 Diverticulosis of intestine, part unspecified, without perforation or abscess without bleeding: Secondary | ICD-10-CM | POA: Diagnosis not present

## 2020-02-10 ENCOUNTER — Encounter (HOSPITAL_COMMUNITY): Payer: Self-pay | Admitting: Emergency Medicine

## 2020-02-10 ENCOUNTER — Other Ambulatory Visit: Payer: Self-pay

## 2020-02-10 ENCOUNTER — Ambulatory Visit (HOSPITAL_COMMUNITY)
Admission: EM | Admit: 2020-02-10 | Discharge: 2020-02-10 | Disposition: A | Discharge status: Home / Self Care | Payer: Medicare PPO | Attending: Emergency Medicine | Admitting: Emergency Medicine

## 2020-02-10 DIAGNOSIS — R21 Rash and other nonspecific skin eruption: Secondary | ICD-10-CM

## 2020-02-10 MED ORDER — PREDNISONE 20 MG PO TABS: 20.0000 mg | ORAL_TABLET | Freq: Every day | ORAL | 0 refills | Status: AC

## 2020-02-10 MED ORDER — CLINDAMYCIN HCL 150 MG PO CAPS: 450.0000 mg | ORAL_CAPSULE | Freq: Three times a day (TID) | ORAL | 0 refills | Status: AC
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
## 2020-02-10 NOTE — ED Triage Notes (Signed)
Pt presents with rash and peeling on face. Daughter states started yesterday but was much worse this morning.

## 2020-02-10 NOTE — Discharge Instructions (Signed)
I do feel concern for infection to the skin because of the peeling and pain, the yellow tinge.  I would like you complete a course of antibiotics.  I have also sent 5 days of prednisone.  If any worsening or no improvement please return.

## 2020-02-12 DIAGNOSIS — Z96642 Presence of left artificial hip joint: Secondary | ICD-10-CM | POA: Diagnosis not present

## 2020-02-12 DIAGNOSIS — S52572D Other intraarticular fracture of lower end of left radius, subsequent encounter for closed fracture with routine healing: Secondary | ICD-10-CM | POA: Diagnosis not present

## 2020-02-12 DIAGNOSIS — E7800 Pure hypercholesterolemia, unspecified: Secondary | ICD-10-CM | POA: Diagnosis not present

## 2020-02-12 DIAGNOSIS — Z7901 Long term (current) use of anticoagulants: Secondary | ICD-10-CM | POA: Diagnosis not present

## 2020-02-12 DIAGNOSIS — K5790 Diverticulosis of intestine, part unspecified, without perforation or abscess without bleeding: Secondary | ICD-10-CM | POA: Diagnosis not present

## 2020-02-12 DIAGNOSIS — I10 Essential (primary) hypertension: Secondary | ICD-10-CM | POA: Diagnosis not present

## 2020-02-12 DIAGNOSIS — Z9181 History of falling: Secondary | ICD-10-CM | POA: Diagnosis not present

## 2020-02-12 DIAGNOSIS — E119 Type 2 diabetes mellitus without complications: Secondary | ICD-10-CM | POA: Diagnosis not present

## 2020-02-12 DIAGNOSIS — I480 Paroxysmal atrial fibrillation: Secondary | ICD-10-CM | POA: Diagnosis not present

## 2020-02-13 DIAGNOSIS — Z9181 History of falling: Secondary | ICD-10-CM | POA: Diagnosis not present

## 2020-02-13 DIAGNOSIS — E7800 Pure hypercholesterolemia, unspecified: Secondary | ICD-10-CM | POA: Diagnosis not present

## 2020-02-13 DIAGNOSIS — E119 Type 2 diabetes mellitus without complications: Secondary | ICD-10-CM | POA: Diagnosis not present

## 2020-02-13 DIAGNOSIS — S52572D Other intraarticular fracture of lower end of left radius, subsequent encounter for closed fracture with routine healing: Secondary | ICD-10-CM | POA: Diagnosis not present

## 2020-02-13 DIAGNOSIS — I10 Essential (primary) hypertension: Secondary | ICD-10-CM | POA: Diagnosis not present

## 2020-02-13 DIAGNOSIS — Z7901 Long term (current) use of anticoagulants: Secondary | ICD-10-CM | POA: Diagnosis not present

## 2020-02-13 DIAGNOSIS — I480 Paroxysmal atrial fibrillation: Secondary | ICD-10-CM | POA: Diagnosis not present

## 2020-02-13 DIAGNOSIS — Z96642 Presence of left artificial hip joint: Secondary | ICD-10-CM | POA: Diagnosis not present

## 2020-02-13 DIAGNOSIS — K5790 Diverticulosis of intestine, part unspecified, without perforation or abscess without bleeding: Secondary | ICD-10-CM | POA: Diagnosis not present

## 2020-02-14 DIAGNOSIS — Z7901 Long term (current) use of anticoagulants: Secondary | ICD-10-CM | POA: Diagnosis not present

## 2020-02-14 DIAGNOSIS — Z9181 History of falling: Secondary | ICD-10-CM | POA: Diagnosis not present

## 2020-02-14 DIAGNOSIS — S52572D Other intraarticular fracture of lower end of left radius, subsequent encounter for closed fracture with routine healing: Secondary | ICD-10-CM | POA: Diagnosis not present

## 2020-02-14 DIAGNOSIS — I10 Essential (primary) hypertension: Secondary | ICD-10-CM | POA: Diagnosis not present

## 2020-02-14 DIAGNOSIS — K5790 Diverticulosis of intestine, part unspecified, without perforation or abscess without bleeding: Secondary | ICD-10-CM | POA: Diagnosis not present

## 2020-02-14 DIAGNOSIS — I480 Paroxysmal atrial fibrillation: Secondary | ICD-10-CM | POA: Diagnosis not present

## 2020-02-14 DIAGNOSIS — E7800 Pure hypercholesterolemia, unspecified: Secondary | ICD-10-CM | POA: Diagnosis not present

## 2020-02-14 DIAGNOSIS — Z96642 Presence of left artificial hip joint: Secondary | ICD-10-CM | POA: Diagnosis not present

## 2020-02-14 DIAGNOSIS — E119 Type 2 diabetes mellitus without complications: Secondary | ICD-10-CM | POA: Diagnosis not present

## 2020-02-19 DIAGNOSIS — S52572D Other intraarticular fracture of lower end of left radius, subsequent encounter for closed fracture with routine healing: Secondary | ICD-10-CM | POA: Diagnosis not present

## 2020-02-19 DIAGNOSIS — E7800 Pure hypercholesterolemia, unspecified: Secondary | ICD-10-CM | POA: Diagnosis not present

## 2020-02-19 DIAGNOSIS — I480 Paroxysmal atrial fibrillation: Secondary | ICD-10-CM | POA: Diagnosis not present

## 2020-02-19 DIAGNOSIS — Z7901 Long term (current) use of anticoagulants: Secondary | ICD-10-CM | POA: Diagnosis not present

## 2020-02-19 DIAGNOSIS — K5790 Diverticulosis of intestine, part unspecified, without perforation or abscess without bleeding: Secondary | ICD-10-CM | POA: Diagnosis not present

## 2020-02-19 DIAGNOSIS — I10 Essential (primary) hypertension: Secondary | ICD-10-CM | POA: Diagnosis not present

## 2020-02-19 DIAGNOSIS — Z96642 Presence of left artificial hip joint: Secondary | ICD-10-CM | POA: Diagnosis not present

## 2020-02-19 DIAGNOSIS — Z9181 History of falling: Secondary | ICD-10-CM | POA: Diagnosis not present

## 2020-02-19 DIAGNOSIS — E119 Type 2 diabetes mellitus without complications: Secondary | ICD-10-CM | POA: Diagnosis not present

## 2020-02-22 DIAGNOSIS — Z7901 Long term (current) use of anticoagulants: Secondary | ICD-10-CM | POA: Diagnosis not present

## 2020-02-22 DIAGNOSIS — S52572D Other intraarticular fracture of lower end of left radius, subsequent encounter for closed fracture with routine healing: Secondary | ICD-10-CM | POA: Diagnosis not present

## 2020-02-22 DIAGNOSIS — I10 Essential (primary) hypertension: Secondary | ICD-10-CM | POA: Diagnosis not present

## 2020-02-22 DIAGNOSIS — E7800 Pure hypercholesterolemia, unspecified: Secondary | ICD-10-CM | POA: Diagnosis not present

## 2020-02-22 DIAGNOSIS — I480 Paroxysmal atrial fibrillation: Secondary | ICD-10-CM | POA: Diagnosis not present

## 2020-02-22 DIAGNOSIS — K5790 Diverticulosis of intestine, part unspecified, without perforation or abscess without bleeding: Secondary | ICD-10-CM | POA: Diagnosis not present

## 2020-02-22 DIAGNOSIS — E119 Type 2 diabetes mellitus without complications: Secondary | ICD-10-CM | POA: Diagnosis not present

## 2020-02-22 DIAGNOSIS — Z9181 History of falling: Secondary | ICD-10-CM | POA: Diagnosis not present

## 2020-02-22 DIAGNOSIS — Z96642 Presence of left artificial hip joint: Secondary | ICD-10-CM | POA: Diagnosis not present

## 2020-03-12 ENCOUNTER — Telehealth: Payer: Self-pay

## 2020-03-12 NOTE — Telephone Encounter (Signed)
Called daughter at a different number [phone] and got pt set up.

## 2020-03-12 NOTE — Telephone Encounter (Signed)
Called the number that contacted [place] and the mail box is full unable to leave a msg of the below msg.

## 2020-03-12 NOTE — Telephone Encounter (Signed)
Yes I would be happy to see her  

## 2020-03-12 NOTE — Telephone Encounter (Signed)
Patient daughter is calling and stated that provider see's dad and wanted to see if he would except spouse as a new patient, please advise. CB is [phone].

## 2020-03-19 ENCOUNTER — Ambulatory Visit: Payer: Medicare PPO | Admitting: Family Medicine

## 2020-03-19 ENCOUNTER — Encounter: Payer: Self-pay | Admitting: Family Medicine

## 2020-03-19 ENCOUNTER — Other Ambulatory Visit: Payer: Self-pay

## 2020-03-19 ENCOUNTER — Other Ambulatory Visit: Payer: Self-pay | Admitting: Family Medicine

## 2020-03-19 VITALS — BP 148/82 | HR 80 | Temp 98.5°F | Ht 62.0 in | Wt 126.0 lb

## 2020-03-19 DIAGNOSIS — E119 Type 2 diabetes mellitus without complications: Secondary | ICD-10-CM

## 2020-03-19 DIAGNOSIS — H409 Unspecified glaucoma: Secondary | ICD-10-CM

## 2020-03-19 DIAGNOSIS — R413 Other amnesia: Secondary | ICD-10-CM

## 2020-03-19 DIAGNOSIS — I480 Paroxysmal atrial fibrillation: Secondary | ICD-10-CM

## 2020-03-19 DIAGNOSIS — Z23 Encounter for immunization: Secondary | ICD-10-CM

## 2020-03-19 DIAGNOSIS — I10 Essential (primary) hypertension: Secondary | ICD-10-CM | POA: Diagnosis not present

## 2020-03-19 DIAGNOSIS — E785 Hyperlipidemia, unspecified: Secondary | ICD-10-CM | POA: Diagnosis not present

## 2020-03-19 MED ORDER — DONEPEZIL HCL 5 MG PO TABS: 5.0000 mg | ORAL_TABLET | Freq: Every day | ORAL | 0 refills | Status: DC

## 2020-03-19 NOTE — Progress Notes (Addendum)
   Subjective:    Patient ID: [name], female    DOB: Jul 25, 1928, 86 y.o.   MRN: [id number]  HPI Here with her husband [name] (who is also my patient) and her daughter [name] (phone is [phone]) to establish with [place] for primary care. She is transferring from the care of Dr. Jackquline Denmark. The patient says she feels fine and she has no concerns, but the family asks for help with memory loss and possible dementia. Over the past year or so she has had increasing difficulty with remembering things like phone numbers and birthdays, though she has never had trouble with recognizing family members. They says she is beginning to "sundown" in that she gets a little confused in the evenings. No agitation or wandering. She sleeps fairly well and he appetite is good. She sees Rudi Coco NP for paroxysmal atrial fibrillation and HTN. This has been well controlled so far. She takes Eliquis 2.5 mg BID.  Her BP at home is stable. Her diabetes is not tightly controlled, but they check her fasting glucoses every morning and these usually run from 130 to 150. On 01-05-20 she lost her balance on some steps and she fell, fracturing the left distal radius. She saw Dr. Worthy Rancher for this and it has healed well with simple splinting. She has mild renal disease, and her last creatinine was 1.20. she had a diverticular bleed a few months ago but this quickly resolved. Her last Hgb in September was 11.4.    Review of Systems  Constitutional: Negative.   Respiratory: Negative.   Cardiovascular: Negative.   Gastrointestinal: Negative.   Genitourinary: Negative.   Psychiatric/Behavioral: Positive for confusion. Negative for agitation, dysphoric mood, hallucinations, self-injury and sleep disturbance. The patient is not nervous/anxious and is not hyperactive.        Objective:   Physical Exam Constitutional:      Appearance: Normal appearance.  Cardiovascular:     Rate and Rhythm: Normal rate and  regular rhythm.     Pulses: Normal pulses.     Heart sounds: Normal heart sounds.  Pulmonary:     Effort: Pulmonary effort is normal.     Breath sounds: Normal breath sounds.  Neurological:     General: No focal deficit present.     Mental Status: She is alert. She is disoriented.  Psychiatric:        Mood and Affect: Mood normal.        Behavior: Behavior normal.           Assessment & Plan:  Intro visit for this patient with stable PAF, HTN, DM, and glaucoma. She has memory issues and likely early dementia. She will try Aricept 5 mg daily for a month. Refer to Neurology per the family's wishes. Recheck here in 30 days. Given a flu shot.  Gershon Crane, MD

## 2020-03-19 NOTE — Addendum Note (Signed)
Addended by: Gershon Crane A on: 03/19/2020 12:33 PM   Modules accepted: Orders

## 2020-03-19 NOTE — Progress Notes (Signed)
   Subjective:    Patient ID: [name], female    DOB: 1928-10-28, 86 y.o.   MRN: [id number]  HPI    Review of Systems     Objective:   Physical Exam        Assessment & Plan:

## 2020-03-28 DIAGNOSIS — S0003XA Contusion of scalp, initial encounter: Secondary | ICD-10-CM | POA: Diagnosis not present

## 2020-03-28 DIAGNOSIS — E119 Type 2 diabetes mellitus without complications: Secondary | ICD-10-CM | POA: Diagnosis not present

## 2020-03-28 DIAGNOSIS — R42 Dizziness and giddiness: Secondary | ICD-10-CM | POA: Diagnosis not present

## 2020-03-28 DIAGNOSIS — W19XXXA Unspecified fall, initial encounter: Secondary | ICD-10-CM | POA: Diagnosis not present

## 2020-03-28 DIAGNOSIS — E785 Hyperlipidemia, unspecified: Secondary | ICD-10-CM | POA: Diagnosis not present

## 2020-03-28 DIAGNOSIS — I10 Essential (primary) hypertension: Secondary | ICD-10-CM | POA: Diagnosis not present

## 2020-03-28 DIAGNOSIS — S0990XA Unspecified injury of head, initial encounter: Secondary | ICD-10-CM | POA: Diagnosis not present

## 2020-03-28 DIAGNOSIS — Z7901 Long term (current) use of anticoagulants: Secondary | ICD-10-CM | POA: Diagnosis not present

## 2020-03-28 DIAGNOSIS — I4891 Unspecified atrial fibrillation: Secondary | ICD-10-CM | POA: Diagnosis not present

## 2020-03-28 DIAGNOSIS — Z79899 Other long term (current) drug therapy: Secondary | ICD-10-CM | POA: Diagnosis not present

## 2020-03-28 DIAGNOSIS — J984 Other disorders of lung: Secondary | ICD-10-CM | POA: Diagnosis not present

## 2020-03-28 DIAGNOSIS — R000 Tachycardia, unspecified: Secondary | ICD-10-CM | POA: Diagnosis not present

## 2020-03-28 DIAGNOSIS — S199XXA Unspecified injury of neck, initial encounter: Secondary | ICD-10-CM | POA: Diagnosis not present

## 2020-03-28 DIAGNOSIS — R531 Weakness: Secondary | ICD-10-CM | POA: Diagnosis not present

## 2020-03-28 DIAGNOSIS — Z880 Allergy status to penicillin: Secondary | ICD-10-CM | POA: Diagnosis not present

## 2020-04-08 ENCOUNTER — Ambulatory Visit: Payer: Medicare PPO | Admitting: Family Medicine

## 2020-04-08 ENCOUNTER — Encounter: Payer: Self-pay | Admitting: Family Medicine

## 2020-04-08 ENCOUNTER — Other Ambulatory Visit: Payer: Self-pay

## 2020-04-08 VITALS — BP 126/72 | HR 96 | Temp 98.8°F | Ht 61.5 in | Wt 118.0 lb

## 2020-04-08 DIAGNOSIS — S0093XD Contusion of unspecified part of head, subsequent encounter: Secondary | ICD-10-CM | POA: Diagnosis not present

## 2020-04-08 NOTE — Progress Notes (Signed)
   Subjective:    Patient ID: [name], female    DOB: 06-06-1928, 86 y.o.   MRN: [id number]  HPI Here with her husband and daughter to follow up an ER visit on 03-28-20 for a fall. That night she had gotten out of bed to use the bathroom when she apparently got dizzy and yelled out for her husband. He woke up and tried to catch her, but she fell and struck her head against a dresser. No open wounds. No LOC. At the ER she had CT scans of the head and cervical spine which were all negative. The workup was otherwise unremarkable, so she was given IV fluids and sent home. Since then she has been fine.    Review of Systems  Constitutional: Negative.   Respiratory: Negative.   Cardiovascular: Negative.   Neurological: Positive for light-headedness. Negative for dizziness, seizures, syncope, speech difficulty and headaches.       Objective:   Physical Exam Constitutional:      Appearance: Normal appearance.  Cardiovascular:     Rate and Rhythm: Normal rate and regular rhythm.     Pulses: Normal pulses.     Heart sounds: Normal heart sounds.  Pulmonary:     Effort: Pulmonary effort is normal.     Breath sounds: Normal breath sounds.  Neurological:     General: No focal deficit present.     Mental Status: She is alert. Mental status is at baseline. She is disoriented.     Coordination: Coordination normal.           Assessment & Plan:  She has had a fall with a head contusion. No lasting effects. She is due to see Neurology soon for a dementia evaluation.  Gershon Crane, MD

## 2020-04-19 ENCOUNTER — Other Ambulatory Visit (HOSPITAL_COMMUNITY): Payer: Self-pay | Admitting: Nurse Practitioner

## 2020-05-02 ENCOUNTER — Encounter: Payer: Self-pay | Admitting: Family Medicine

## 2020-05-02 DIAGNOSIS — E119 Type 2 diabetes mellitus without complications: Secondary | ICD-10-CM | POA: Diagnosis not present

## 2020-05-02 DIAGNOSIS — H401133 Primary open-angle glaucoma, bilateral, severe stage: Secondary | ICD-10-CM | POA: Diagnosis not present

## 2020-05-02 DIAGNOSIS — H209 Unspecified iridocyclitis: Secondary | ICD-10-CM | POA: Diagnosis not present

## 2020-05-02 DIAGNOSIS — H5712 Ocular pain, left eye: Secondary | ICD-10-CM | POA: Diagnosis not present

## 2020-05-02 LAB — HM DIABETES EYE EXAM

## 2020-05-09 ENCOUNTER — Telehealth: Payer: Self-pay | Admitting: Family Medicine

## 2020-05-09 NOTE — Telephone Encounter (Signed)
Tried calling pt to schedule Medicare Annual Wellness Visit (AWV) either virtually or in office.  No answer  Last AWV ; please schedule at anytime with [facility] Nurse Health Advisor 1 or 2   This should be a 45 minute visit. 

## 2020-05-29 ENCOUNTER — Telehealth: Payer: Self-pay

## 2020-05-29 NOTE — Telephone Encounter (Signed)
Called and spoke to [name]. [name] was informed that Dr. Rosalyn Gess office has been trying to reach them but their calls were not answered nor returned. Gave her the number to Dr. Rosalyn Gess office at [phone] and informed her that she must call their office to schedule an appointment. [name] verbalized understanding and had no further questions.

## 2020-06-20 ENCOUNTER — Other Ambulatory Visit: Payer: Self-pay | Admitting: Family Medicine

## 2020-06-21 ENCOUNTER — Other Ambulatory Visit: Payer: Self-pay | Admitting: Family Medicine

## 2020-07-04 NOTE — Progress Notes (Signed)
Subjective:   [name] is a 87 y.o. female who presents for an Initial Medicare Annual Wellness Visit. I connected with Allix Blomquist today by telephone and verified that I am speaking with the correct person using two identifiers. Location patient: home Location provider: work Persons participating in the virtual visit: patient, provider.   I discussed the limitations, risks, security and privacy concerns of performing an evaluation and management service by telephone and the availability of in person appointments. I also discussed with the patient that there may be a patient responsible charge related to this service. The patient expressed understanding and verbally consented to this telephonic visit.    Interactive audio and video telecommunications were attempted between this provider and patient, however failed, due to patient having technical difficulties OR patient did not have access to video capability.  We continued and completed visit with audio only.     Review of Systems    N/A       Objective:    There were no vitals filed for this visit. There is no height or weight on file to calculate BMI.  Advanced Directives 07/05/2020 01/05/2020 01/05/2020 05/03/2018 12/17/2017 06/21/2017 07/31/2012  Does Patient Have a Medical Advance Directive? Yes No No Yes No Yes Patient does not have advance directive;Patient would not like information  Type of Advance Directive - - - Living will - Healthcare Power of [place];Living will -  Does patient want to make changes to medical advance directive? No - Patient declined - - No - Patient declined - No - Patient declined -  Copy of Healthcare Power of Attorney in Chart? - - - - - No - copy requested -  Would patient like information on creating a medical advance directive? - No - Patient declined No - Patient declined - No - Patient declined - -    Current Medications (verified) Outpatient Encounter Medications as of 07/05/2020   Medication Sig  . acetaminophen (TYLENOL) 500 MG tablet Take 500 mg by mouth every 6 (six) hours as needed.   . brimonidine-timolol (COMBIGAN) 0.2-0.5 % ophthalmic solution Place 1 drop into both eyes every 12 (twelve) hours.  Marland Kitchen ELIQUIS 2.5 MG TABS tablet TAKE 1 TABLET BY MOUTH TWICE DAILY(APPOINTMENT REQUIRED FOR FURTHER REFILLS. [phone])  . furosemide (LASIX) 20 MG tablet TAKE HALF TABLET BY MOUTH EVERY MONDAY, WEDNESDAY, AND FRIDAY  . LUMIGAN 0.01 % SOLN Place 1 drop into both eyes at bedtime.  . Multiple Vitamins-Minerals (CENTRUM SILVER 50+WOMEN) TABS Take 1 tablet by mouth daily.  . nepafenac (ILEVRO) 0.3 % ophthalmic suspension Place 1 drop into both eyes 2 (two) times daily.  Marland Kitchen donepezil (ARICEPT) 5 MG tablet TAKE 1 TABLET(5 MG) BY MOUTH AT BEDTIME (Patient not taking: Reported on 07/05/2020)  . influenza vaccine adjuvanted (FLUAD QUADRIVALENT) 0.5 ML injection Fluad Quad 2020-2021(31yr up)(PF) 60 mcg (15 mcg x 4)/0.52mL IM syringe  ADM 0.5ML IM UTD   No facility-administered encounter medications on file as of 07/05/2020.    Allergies (verified) Penicillins   History: Past Medical History:  Diagnosis Date  . Diabetes mellitus without complication (HCC)   . Diverticulitis   . Glaucoma   . Hypercholesterolemia   . Hypertension   . PAF (paroxysmal atrial fibrillation) (HCC)    sees Rudi Coco NP   . Shingles    Past Surgical History:  Procedure Laterality Date  . ABDOMINAL HYSTERECTOMY    . COLONOSCOPY     per Dr. Elnoria Howard   . TOTAL HIP ARTHROPLASTY  2005   left sided   Family History  Problem Relation Age of Onset  . CAD Father    Social History   Socioeconomic History  . Marital status: Married    Spouse name: Not on file  . Number of children: Not on file  . Years of education: Not on file  . Highest education level: Not on file  Occupational History  . Not on file  Tobacco Use  . Smoking status: Never Smoker  . Smokeless tobacco: Never Used   Substance and Sexual Activity  . Alcohol use: Yes    Alcohol/week: 2.0 standard drinks    Types: 2 Standard drinks or equivalent per week    Comment: occassionally  . Drug use: No  . Sexual activity: Not on file  Other Topics Concern  . Not on file  Social History Narrative  . Not on file   Social Determinants of Health   Financial Resource Strain: Low Risk   . Difficulty of Paying Living Expenses: Not hard at all  Food Insecurity: No Food Insecurity  . Worried About Programme researcher, broadcasting/film/videounning Out of Food in the Last Year: Never true  . Ran Out of Food in the Last Year: Never true  Transportation Needs: No Transportation Needs  . Lack of Transportation (Medical): No  . Lack of Transportation (Non-Medical): No  Physical Activity: Inactive  . Days of Exercise per Week: 0 days  . Minutes of Exercise per Session: 0 min  Stress: No Stress Concern Present  . Feeling of Stress : Not at all  Social Connections: Socially Integrated  . Frequency of Communication with Friends and Family: More than three times a week  . Frequency of Social Gatherings with Friends and Family: More than three times a week  . Attends Religious Services: More than 4 times per year  . Active Member of Clubs or Organizations: Yes  . Attends BankerClub or Organization Meetings: More than 4 times per year  . Marital Status: Married    Tobacco Counseling Counseling given: Not Answered   Clinical Intake:  Pre-visit preparation completed: Yes  Pain : No/denies pain     Nutritional Risks: Unintentional weight loss Diabetes: Yes CBG done?: No Did pt. bring in CBG monitor from home?: No  How often do you need to have someone help you when you read instructions, pamphlets, or other written materials from your doctor or pharmacy?: 3 - Sometimes What is the last grade level you completed in school?: college  Diabetic?yes Nutrition Risk Assessment:  Has the patient had any N/V/D within the last 2 months?  No  Does the patient  have any non-healing wounds?  No  Has the patient had any unintentional weight loss or weight gain?  Yes   Diabetes:   Is the patient diabetic?  Yes  If diabetic, was a CBG obtained today?  No  Did the patient bring in their glucometer from home?  No  How often do you monitor your CBG's? everyday.   Financial Strains and Diabetes Management:  Are you having any financial strains with the device, your supplies or your medication? No .  Does the patient want to be seen by Chronic Care Management for management of their diabetes?  No  Would the patient like to be referred to a Nutritionist or for Diabetic Management?  No   Diabetic Exams:  Diabetic Eye Exam: Overdue for diabetic eye exam. Pt has been advised about the importance in completing this exam. Patient advised to call and  schedule an eye exam. Diabetic Foot Exam: Overdue, Pt has been advised about the importance in completing this exam. Pt is scheduled for diabetic foot exam on will perform next offive visit.   Interpreter Needed?: No      Activities of Daily Living In your present state of health, do you have any difficulty performing the following activities: 07/05/2020  Hearing? N  Vision? N  Difficulty concentrating or making decisions? Y  Walking or climbing stairs? Y  Dressing or bathing? Y  Doing errands, shopping? Y  Preparing Food and eating ? N  Using the Toilet? N  In the past six months, have you accidently leaked urine? Y  Do you have problems with loss of bowel control? Y  Managing your Medications? Y  Managing your Finances? Y  Housekeeping or managing your Housekeeping? Y  Some recent data might be hidden    Patient Care Team: Nelwyn Salisbury, MD as PCP - General (Family Medicine)  Indicate any recent Medical Services you may have received from other than [facility] providers in the past year (date may be approximate).     Assessment:   This is a routine wellness examination for  [name].  Hearing/Vision screen  Hearing Screening   125Hz  250Hz  500Hz  1000Hz  2000Hz  3000Hz  4000Hz  6000Hz  8000Hz   Right ear:           Left ear:           Comments: Pt seen every 6 months    Dietary issues and exercise activities discussed: Current Exercise Habits: The patient does not participate in regular exercise at present, Exercise limited by: orthopedic condition(s);cardiac condition(s)  Goals    . Prevent falls     Use walker assisted device to prevent falls      Depression Screen PHQ 2/9 Scores 07/05/2020 03/19/2020  PHQ - 2 Score 0 3  PHQ- 9 Score - 15    Fall Risk Fall Risk  07/05/2020  Falls in the past year? 1  Number falls in past yr: 1  Injury with Fall? 1  Risk for fall due to : Impaired balance/gait;History of fall(s)  Follow up Falls evaluation completed    FALL RISK PREVENTION PERTAINING TO THE HOME:  Any stairs in or around the home? Yes  If so, are there any without handrails? yes Home free of loose throw rugs in walkways, pet beds, electrical cords, etc? Yes  Adequate lighting in your home to reduce risk of falls? Yes   ASSISTIVE DEVICES UTILIZED TO PREVENT FALLS:  Life alert? No  Use of a cane, walker or w/c? Yes  Grab bars in the bathroom? Yes  Shower chair or bench in shower? Yes  Elevated toilet seat or a handicapped toilet? Yes     Cognitive Function:   Cognitive status assessed by direct observation. Patient has current diagnosis of cognitive impairment.  Patient is unable to complete screening 6CIT or MMSE.       Immunizations Immunization History  Administered Date(s) Administered  . Fluad Quad(high Dose 65+) 01/03/2019, 03/19/2020  . Influenza, High Dose Seasonal PF 01/28/2018  . PFIZER(Purple Top)SARS-COV-2 Vaccination 05/02/2019, 05/23/2019    TDAP status: Due, Education has been provided regarding the importance of this vaccine. Advised may receive this vaccine at local pharmacy or Health Dept. Aware to provide a copy of the  vaccination record if obtained from local pharmacy or Health Dept. Verbalized acceptance and understanding.  Flu Vaccine status: Up to date  Pneumococcal vaccine status: Due, Education has been  provided regarding the importance of this vaccine. Advised may receive this vaccine at local pharmacy or Health Dept. Aware to provide a copy of the vaccination record if obtained from local pharmacy or Health Dept. Verbalized acceptance and understanding.  Covid-19 vaccine status: Completed vaccines  Qualifies for Shingles Vaccine? Yes   Zostavax completed No   Shingrix Completed?: No.    Education has been provided regarding the importance of this vaccine. Patient has been advised to call insurance company to determine out of pocket expense if they have not yet received this vaccine. Advised may also receive vaccine at local pharmacy or Health Dept. Verbalized acceptance and understanding.  Screening Tests Health Maintenance  Topic Date Due  . FOOT EXAM  Never done  . URINE MICROALBUMIN  Never done  . TETANUS/TDAP  Never done  . DEXA SCAN  Never done  . PNA vac Low Risk Adult (1 of 2 - PCV13) Never done  . HEMOGLOBIN A1C  06/18/2018  . COVID-19 Vaccine (3 - Booster for Pfizer series) 11/20/2019  . OPHTHALMOLOGY EXAM  05/02/2021  . INFLUENZA VACCINE  Completed  . HPV VACCINES  Aged Out    Health Maintenance  Health Maintenance Due  Topic Date Due  . FOOT EXAM  Never done  . URINE MICROALBUMIN  Never done  . TETANUS/TDAP  Never done  . DEXA SCAN  Never done  . PNA vac Low Risk Adult (1 of 2 - PCV13) Never done  . HEMOGLOBIN A1C  06/18/2018  . COVID-19 Vaccine (3 - Booster for Pfizer series) 11/20/2019    Colorectal cancer screening: No longer required.   Mammogram status: No longer required due to age.  Bone Density Status : No Longer required  Lung Cancer Screening: (Low Dose CT Chest recommended if Age 72-80 years, 30 pack-year currently smoking OR have quit w/in 15years.) does  not qualify.   Lung Cancer Screening Referral: n/a  Additional Screening:  Hepatitis C Screening: does not qualify;  Vision Screening: Recommended annual ophthalmology exams for early detection of glaucoma and other disorders of the eye. Is the patient up to date with their annual eye exam?  Yes  Who is the provider or what is the name of the office in which the patient attends annual eye exams? Dr. Samuel Bouche If pt is not established with a provider, would they like to be referred to a provider to establish care? No .   Dental Screening: Recommended annual dental exams for proper oral hygiene  Community Resource Referral / Chronic Care Management: CRR required this visit?  No   CCM required this visit?  No        Plan:     I have personally reviewed and noted the following in the patient's chart:   . Medical and social history . Use of alcohol, tobacco or illicit drugs  . Current medications and supplements . Functional ability and status . Nutritional status . Physical activity . Advanced directives . List of other physicians . Hospitalizations, surgeries, and ER visits in previous 12 months . Vitals . Screenings to include cognitive, depression, and falls . Referrals and appointments  In addition, I have reviewed and discussed with patient certain preventive protocols, quality metrics, and best practice recommendations. A written personalized care plan for preventive services as well as general preventive health recommendations were provided to patient.     March Rummage, LPN   [id number]/[id number]   Nurse Notes: none

## 2020-07-05 ENCOUNTER — Ambulatory Visit (INDEPENDENT_AMBULATORY_CARE_PROVIDER_SITE_OTHER): Payer: Medicare PPO

## 2020-07-05 DIAGNOSIS — Z0000 Encounter for general adult medical examination without abnormal findings: Secondary | ICD-10-CM | POA: Diagnosis not present

## 2020-07-05 NOTE — Patient Instructions (Signed)
[name] , Thank you for taking time to come for your Medicare Wellness Visit. I appreciate your ongoing commitment to your health goals. Please review the following plan we discussed and let me know if I can assist you in the future.   Screening recommendations/referrals: Colonoscopy: No longer required  Mammogram: No Longer required Bone Density:No Longer required Recommended yearly ophthalmology/optometry visit for glaucoma screening and checkup Recommended yearly dental visit for hygiene and checkup  Vaccinations: Influenza vaccine: Up to date-Fall 2022 Pneumococcal vaccine: Currently Due  Tdap vaccine: Currently Due  Shingles vaccine: Currently Due    Advanced directives: Requested Copies   Conditions/risks identified: Fall Risk   Next appointment: None   Preventive Care 65 Years and Older, Female Preventive care refers to lifestyle choices and visits with your health care provider that can promote health and wellness. What does preventive care include?  A yearly physical exam. This is also called an annual well check.  Dental exams once or twice a year.  Routine eye exams. Ask your health care provider how often you should have your eyes checked.  Personal lifestyle choices, including:  Daily care of your teeth and gums.  Regular physical activity.  Eating a healthy diet.  Avoiding tobacco and drug use.  Limiting alcohol use.  Practicing safe sex.  Taking low-dose aspirin every day.  Taking vitamin and mineral supplements as recommended by your health care provider. What happens during an annual well check? The services and screenings done by your health care provider during your annual well check will depend on your age, overall health, lifestyle risk factors, and family history of disease. Counseling  Your health care provider may ask you questions about your:  Alcohol use.  Tobacco use.  Drug use.  Emotional well-being.  Home and relationship  well-being.  Sexual activity.  Eating habits.  History of falls.  Memory and ability to understand (cognition).  Work and work Astronomer.  Reproductive health. Screening  You may have the following tests or measurements:  Height, weight, and BMI.  Blood pressure.  Lipid and cholesterol levels. These may be checked every 5 years, or more frequently if you are over 72 years old.  Skin check.  Lung cancer screening. You may have this screening every year starting at age 36 if you have a 30-pack-year history of smoking and currently smoke or have quit within the past 15 years.  Fecal occult blood test (FOBT) of the stool. You may have this test every year starting at age 50.  Flexible sigmoidoscopy or colonoscopy. You may have a sigmoidoscopy every 5 years or a colonoscopy every 10 years starting at age 110.  Hepatitis C blood test.  Hepatitis B blood test.  Sexually transmitted disease (STD) testing.  Diabetes screening. This is done by checking your blood sugar (glucose) after you have not eaten for a while (fasting). You may have this done every 1-3 years.  Bone density scan. This is done to screen for osteoporosis. You may have this done starting at age 59.  Mammogram. This may be done every 1-2 years. Talk to your health care provider about how often you should have regular mammograms. Talk with your health care provider about your test results, treatment options, and if necessary, the need for more tests. Vaccines  Your health care provider may recommend certain vaccines, such as:  Influenza vaccine. This is recommended every year.  Tetanus, diphtheria, and acellular pertussis (Tdap, Td) vaccine. You may need a Td booster every 10  years.  Zoster vaccine. You may need this after age 73.  Pneumococcal 13-valent conjugate (PCV13) vaccine. One dose is recommended after age 48.  Pneumococcal polysaccharide (PPSV23) vaccine. One dose is recommended after age  78. Talk to your health care provider about which screenings and vaccines you need and how often you need them. This information is not intended to replace advice given to you by your health care provider. Make sure you discuss any questions you have with your health care provider. Document Released: 04/26/2015 Document Revised: 12/18/2015 Document Reviewed: 01/29/2015 Elsevier Interactive Patient Education  2017 [place] Prevention in the Home Falls can cause injuries. They can happen to people of all ages. There are many things you can do to make your home safe and to help prevent falls. What can I do on the outside of my home?  Regularly fix the edges of walkways and driveways and fix any cracks.  Remove anything that might make you trip as you walk through a door, such as a raised step or threshold.  Trim any bushes or trees on the path to your home.  Use bright outdoor lighting.  Clear any walking paths of anything that might make someone trip, such as rocks or tools.  Regularly check to see if handrails are loose or broken. Make sure that both sides of any steps have handrails.  Any raised decks and porches should have guardrails on the edges.  Have any leaves, snow, or ice cleared regularly.  Use sand or salt on walking paths during winter.  Clean up any spills in your garage right away. This includes oil or grease spills. What can I do in the bathroom?  Use night lights.  Install grab bars by the toilet and in the tub and shower. Do not use towel bars as grab bars.  Use non-skid mats or decals in the tub or shower.  If you need to sit down in the shower, use a plastic, non-slip stool.  Keep the floor dry. Clean up any water that spills on the floor as soon as it happens.  Remove soap buildup in the tub or shower regularly.  Attach bath mats securely with double-sided non-slip rug tape.  Do not have throw rugs and other things on the floor that can make  you trip. What can I do in the bedroom?  Use night lights.  Make sure that you have a light by your bed that is easy to reach.  Do not use any sheets or blankets that are too big for your bed. They should not hang down onto the floor.  Have a firm chair that has side arms. You can use this for support while you get dressed.  Do not have throw rugs and other things on the floor that can make you trip. What can I do in the kitchen?  Clean up any spills right away.  Avoid walking on wet floors.  Keep items that you use a lot in easy-to-reach places.  If you need to reach something above you, use a strong step stool that has a grab bar.  Keep electrical cords out of the way.  Do not use floor polish or wax that makes floors slippery. If you must use wax, use non-skid floor wax.  Do not have throw rugs and other things on the floor that can make you trip. What can I do with my stairs?  Do not leave any items on the stairs.  Make sure that  there are handrails on both sides of the stairs and use them. Fix handrails that are broken or loose. Make sure that handrails are as long as the stairways.  Check any carpeting to make sure that it is firmly attached to the stairs. Fix any carpet that is loose or worn.  Avoid having throw rugs at the top or bottom of the stairs. If you do have throw rugs, attach them to the floor with carpet tape.  Make sure that you have a light switch at the top of the stairs and the bottom of the stairs. If you do not have them, ask someone to add them for you. What else can I do to help prevent falls?  Wear shoes that:  Do not have high heels.  Have rubber bottoms.  Are comfortable and fit you well.  Are closed at the toe. Do not wear sandals.  If you use a stepladder:  Make sure that it is fully opened. Do not climb a closed stepladder.  Make sure that both sides of the stepladder are locked into place.  Ask someone to hold it for you, if  possible.  Clearly mark and make sure that you can see:  Any grab bars or handrails.  First and last steps.  Where the edge of each step is.  Use tools that help you move around (mobility aids) if they are needed. These include:  Canes.  Walkers.  Scooters.  Crutches.  Turn on the lights when you go into a dark area. Replace any light bulbs as soon as they burn out.  Set up your furniture so you have a clear path. Avoid moving your furniture around.  If any of your floors are uneven, fix them.  If there are any pets around you, be aware of where they are.  Review your medicines with your doctor. Some medicines can make you feel dizzy. This can increase your chance of falling. Ask your doctor what other things that you can do to help prevent falls. This information is not intended to replace advice given to you by your health care provider. Make sure you discuss any questions you have with your health care provider. Document Released: 01/24/2009 Document Revised: 09/05/2015 Document Reviewed: 05/04/2014 Elsevier Interactive Patient Education  2017 [employer].

## 2020-07-08 ENCOUNTER — Ambulatory Visit (HOSPITAL_COMMUNITY): Payer: Medicare PPO | Admitting: Nurse Practitioner

## 2020-07-10 ENCOUNTER — Encounter (HOSPITAL_COMMUNITY): Payer: Self-pay | Admitting: Nurse Practitioner

## 2020-07-10 ENCOUNTER — Other Ambulatory Visit: Payer: Self-pay

## 2020-07-10 ENCOUNTER — Ambulatory Visit (HOSPITAL_COMMUNITY)
Admission: RE | Admit: 2020-07-10 | Discharge: 2020-07-10 | Disposition: A | Discharge status: Home / Self Care | Payer: Medicare PPO | Source: Ambulatory Visit | Attending: Nurse Practitioner | Admitting: Nurse Practitioner

## 2020-07-10 VITALS — BP 128/64 | HR 99 | Ht 61.5 in | Wt 111.6 lb

## 2020-07-10 DIAGNOSIS — I351 Nonrheumatic aortic (valve) insufficiency: Secondary | ICD-10-CM | POA: Insufficient documentation

## 2020-07-10 DIAGNOSIS — E7800 Pure hypercholesterolemia, unspecified: Secondary | ICD-10-CM | POA: Insufficient documentation

## 2020-07-10 DIAGNOSIS — Z7901 Long term (current) use of anticoagulants: Secondary | ICD-10-CM | POA: Insufficient documentation

## 2020-07-10 DIAGNOSIS — Z79899 Other long term (current) drug therapy: Secondary | ICD-10-CM | POA: Insufficient documentation

## 2020-07-10 DIAGNOSIS — D6869 Other thrombophilia: Secondary | ICD-10-CM | POA: Diagnosis not present

## 2020-07-10 DIAGNOSIS — I700 Atherosclerosis of aorta: Secondary | ICD-10-CM | POA: Diagnosis not present

## 2020-07-10 DIAGNOSIS — I4892 Unspecified atrial flutter: Secondary | ICD-10-CM | POA: Diagnosis not present

## 2020-07-10 DIAGNOSIS — I480 Paroxysmal atrial fibrillation: Secondary | ICD-10-CM | POA: Insufficient documentation

## 2020-07-10 DIAGNOSIS — I10 Essential (primary) hypertension: Secondary | ICD-10-CM | POA: Diagnosis not present

## 2020-07-10 NOTE — Progress Notes (Signed)
Primary Care Physician: Nelwyn Salisbury, MD Referring Physician: [facility] f/u    [name] is a 87 y.o. female with a h/o recent  hospitalization for diverticula bleed. She was noted to by ED RN to have syncope with HR in the 30's while having profuse rectal bleeding with clots. She also had some SVT on monitor and left with a holter monitor. This did show paroxysmal afib and she is now in the afib clinic to further discuss. She states that her bleeding spontaneously resolved and she was told she would not need a f/u colonoscopy. She is now in need of anticoagulation with new onset afib and with a chadsvasc score of 4. She is in SR today and was asymptomatic with afib on monitor. Her BB was increased when afib was found. She is chronically anemic with plt's around 120.   F/u in afib clinic 10/28. She was started on anticoagulation after hearing back from Dr. Elnoria Howard that it was reasonable to start and so far she is doing well. No signs of bleeding. No awareness of afib.  F/u in afib clinic, 11/19. Monitor reviewed that pt wore back in September that showed mostly sinus rhythm with low afib burden; She is in SR today but with a HR of 41 bpm, she is asymptomatic.No bleeding issues with eliquis, cbc done recently showed normal H/H. She had an echo done today, results pending.  F/u hospitalization in afib clinic 1/29. Pt has hypotension and dizziness and fell backward. She and her husband states that she did not pass out. Her HR is in the upper 40's with reduced BB dose to just once a day. She is not symptomatic with brady. She was in SR during her hospitalization.  Her BP's at home, reviewed today look in range without any low readings. They have not been recording the HR's.    F/u in afib clinic 06/13/19. She feels great but her EKG looks like an  atrial flutter with v rates around 100 bpm. She is asymptomatic. She does not think that she is on BB, for some reason it is not on her med list, but pharmacy  reports that she did pick up lopressor the end of January. It was a 90 day supply.   F/u in afib clinic, 06/22/19. She remains in atrial flutter but rate controlled now. She does confirm that she is taking BB. She  is asymptomatic other than noting some pedal edema. Her weight may be up 1-2 lbs. She  has had both covid shots now in the last month.  F/u in afib clinic, 07/06/19. She  is back in rhythm. Lasix did not help with her LLE.  F/u 4/22. Stopping amlodipine did help with her LLE plus low dose furosemide. Echo showed normal EF. She  is in rate controlled flutter today, she is completely   asymptomatic. Appears to be paroxysmal as she was in rhythm on last visit.  She denies any fatigue, shortness of breath with exertion, lightheaded.  F/u in afib clinic, 9/17. She had a diverticular bleed 12/03/19,treated at [facility]. HGB dropped  to 10.3 with HGB back to 11.0 8/24, she held her anticoagulation x one week and restarted . She  has not noted any further bleeding. She is in SR today. Has had some bilateral LLE , but  has not taken her prn lasix. Otherwise, no complaints. She  had labs drawn with her PCP right after hospitalization and has another appointment 10/5 for f/u.   F/u in  afib clinic, 07/10/20. She is in SR. She is here with her daughters at her husband is getting too feeble to bring her in. In fact, the daughters reprot that [name] is also getting more feeble and has had some falls. She usually does not fall using her cane in the house but she either forgets to use it or on purpose forgets it. For now do not think I need to stop her anticoagulation but had that discussion with the daughters that if frequent falls continue we may have to  discuss again. She has chronic LLE, has lasix to take but sits in a chair with her feet dangling all day and does not wear any compression socks.   Today, she denies symptoms of palpitations, chest pain, shortness of breath, orthopnea, PND, mild lower  extremity edema, dizziness, presyncope, syncope, or neurologic sequela. The patient is tolerating medications without difficulties and is otherwise without complaint today.   Past Medical History:  Diagnosis Date  . Diabetes mellitus without complication (HCC)   . Diverticulitis   . Glaucoma   . Hypercholesterolemia   . Hypertension   . PAF (paroxysmal atrial fibrillation) (HCC)    sees Rudi Cocoonna Fujie Dickison NP   . Shingles    Past Surgical History:  Procedure Laterality Date  . ABDOMINAL HYSTERECTOMY    . COLONOSCOPY     per Dr. Elnoria HowardHung   . TOTAL HIP ARTHROPLASTY  2005   left sided    Current Outpatient Medications  Medication Sig Dispense Refill  . acetaminophen (TYLENOL) 500 MG tablet Take 500 mg by mouth every 6 (six) hours as needed.     . brimonidine-timolol (COMBIGAN) 0.2-0.5 % ophthalmic solution Place 1 drop into both eyes every 12 (twelve) hours.    Marland Kitchen. donepezil (ARICEPT) 5 MG tablet TAKE 1 TABLET(5 MG) BY MOUTH AT BEDTIME (Patient not taking: Reported on 07/05/2020) 90 tablet 0  . ELIQUIS 2.5 MG TABS tablet TAKE 1 TABLET BY MOUTH TWICE DAILY(APPOINTMENT REQUIRED FOR FURTHER REFILLS. [phone]) 60 tablet 3  . furosemide (LASIX) 20 MG tablet TAKE HALF TABLET BY MOUTH EVERY MONDAY, WEDNESDAY, AND FRIDAY 20 tablet 3  . influenza vaccine adjuvanted (FLUAD QUADRIVALENT) 0.5 ML injection Fluad Quad 2020-2021(9411yr up)(PF) 60 mcg (15 mcg x 4)/0.555mL IM syringe  ADM 0.5ML IM UTD    . LUMIGAN 0.01 % SOLN Place 1 drop into both eyes at bedtime.  5  . nepafenac (ILEVRO) 0.3 % ophthalmic suspension Place 1 drop into both eyes 2 (two) times daily.     No current facility-administered medications for this encounter.    Allergies  Allergen Reactions  . Penicillins Anaphylaxis and Other (See Comments)    Did it involve swelling of the face/tongue/throat, SOB, or low BP? Yes Did it involve sudden or severe rash/hives, skin peeling, or any reaction on the inside of your mouth or nose? Yes Did  you need to seek medical attention at a hospital or doctor's office? Yes When did it last happen? Unknown If all above answers are "NO", may proceed with cephalosporin use.  Did it involve swelling of the face/tongue/throat, SOB, or low BP? Yes Did it involve sudden or severe rash/hives, skin peeling, or any reaction on the inside of your mouth or nose? Yes Did you need to seek medical attention at a hospital or doctor's office? Yes When did it last happen? Unknown If all above answers are "NO", may proceed with cephalosporin use.     Social History   Socioeconomic History  .  Marital status: Married    Spouse name: Not on file  . Number of children: Not on file  . Years of education: Not on file  . Highest education level: Not on file  Occupational History  . Not on file  Tobacco Use  . Smoking status: Never Smoker  . Smokeless tobacco: Never Used  Substance and Sexual Activity  . Alcohol use: Yes    Alcohol/week: 2.0 standard drinks    Types: 2 Standard drinks or equivalent per week    Comment: occassionally  . Drug use: No  . Sexual activity: Not on file  Other Topics Concern  . Not on file  Social History Narrative  . Not on file   Social Determinants of Health   Financial Resource Strain: Low Risk   . Difficulty of Paying Living Expenses: Not hard at all  Food Insecurity: No Food Insecurity  . Worried About Programme researcher, broadcasting/film/video in the Last Year: Never true  . Ran Out of Food in the Last Year: Never true  Transportation Needs: No Transportation Needs  . Lack of Transportation (Medical): No  . Lack of Transportation (Non-Medical): No  Physical Activity: Inactive  . Days of Exercise per Week: 0 days  . Minutes of Exercise per Session: 0 min  Stress: No Stress Concern Present  . Feeling of Stress : Not at all  Social Connections: Socially Integrated  . Frequency of Communication with Friends and Family: More than three times a week  . Frequency of Social  Gatherings with Friends and Family: More than three times a week  . Attends Religious Services: More than 4 times per year  . Active Member of Clubs or Organizations: Yes  . Attends Banker Meetings: More than 4 times per year  . Marital Status: Married  Catering manager Violence: Not At Risk  . Fear of Current or Ex-Partner: No  . Emotionally Abused: No  . Physically Abused: No  . Sexually Abused: No    Family History  Problem Relation Age of Onset  . CAD Father     ROS- All systems are reviewed and negative except as per the HPI above  Physical Exam: There were no vitals filed for this visit. Wt Readings from Last 3 Encounters:  04/08/20 53.5 kg  03/19/20 57.2 kg  01/05/20 61.9 kg    Labs: Lab Results  Component Value Date   NA 137 01/05/2020   K 4.1 01/05/2020   CL 103 01/05/2020   CO2 23 01/05/2020   GLUCOSE 149 (H) 01/05/2020   BUN 24 (H) 01/05/2020   CREATININE 1.20 (H) 01/05/2020   CALCIUM 9.3 01/05/2020   PHOS 4.3 03/23/2008   MG 1.8 12/18/2017   Lab Results  Component Value Date   INR 1.1 01/05/2020   No results found for: CHOL, HDL, LDLCALC, TRIG   GEN- The patient is well appearing, alert and oriented x 3 today.   Head- normocephalic, atraumatic Eyes-  Sclera clear, conjunctiva pink Ears- hearing intact Oropharynx- clear Neck- supple, no JVP Lymph- no cervical lymphadenopathy Lungs- Clear to ausculation bilaterally, normal work of breathing Heart regular rate and rhythm, no murmurs, rubs or gallops, PMI not laterally displaced GI- soft, NT, ND, + BS Extremities- no clubbing, cyanosis, or edema MS- no significant deformity or atrophy Skin- no rash or lesion Psych- euthymic mood, full affect Neuro- strength and sensation are intact  EKG-NSR at 99 bpm, pr int 162 ms, qrs int 72 ms, qtc 441 ms  Epic  records reviewed Monitor results: 9/18-Study Highlights   The basic rhythm is normal sinus There are periods of atrial fibrillation  with a low overall atrial fib burden There are short runs of pSVT and wide complex tachycardia suspect aberration There are no pathologic pauses > 3 seconds   Echo-IMPRESSIONS    1. Inferior basal hypokinesis . Left ventricular ejection fraction, by  estimation, is 55 to 60%. The left ventricle has normal function. The left  ventricle has no regional wall motion abnormalities. Left ventricular  diastolic parameters are  indeterminate.  2. Right ventricular systolic function is normal. The right ventricular  size is normal. There is normal pulmonary artery systolic pressure.  3. Left atrial size was mildly dilated.  4. Aneurysmal segment no obvious PFO.  5. The mitral valve is normal in structure. Mild mitral valve  regurgitation. No evidence of mitral stenosis.  6. The aortic valve is tricuspid. Aortic valve regurgitation is mild.  Mild aortic valve sclerosis is present, with no evidence of aortic valve  stenosis.  7. Aortic dilatation noted. There is mild dilatation of the aortic root  measuring 37 mm.  8. The inferior vena cava is normal in size with greater than 50%  respiratory variability, suggesting right atrial pressure of 3 mmHg.   Labs reviewed form December 2021   Assessment and Plan:  1. Asymptomatic paroxysmal  atrial flutter  In SR today  Off BB for symptomatic bradycardia  She feels well other than has chronic pedal edema She has lasix to take, do not want to increase as I do not want to make  her more prone to falls Knee high compression socks/avod salty foods and elevate feet when sitting  2. Chadsvasc score of at least 4 Continue eliquis 5 mg bid   Use cane to prevent falls If falls escalate may have to consider stopping eliquis  I will see back in 6 months    Lupita Leash C. [facility] [address] [place], [place] [address] [phone]

## 2020-07-23 ENCOUNTER — Ambulatory Visit: Payer: Medicare PPO | Admitting: Family Medicine

## 2020-07-23 ENCOUNTER — Encounter: Payer: Self-pay | Admitting: Family Medicine

## 2020-07-23 ENCOUNTER — Telehealth (HOSPITAL_COMMUNITY): Payer: Self-pay | Admitting: *Deleted

## 2020-07-23 ENCOUNTER — Other Ambulatory Visit: Payer: Self-pay

## 2020-07-23 DIAGNOSIS — R55 Syncope and collapse: Secondary | ICD-10-CM

## 2020-07-23 DIAGNOSIS — I480 Paroxysmal atrial fibrillation: Secondary | ICD-10-CM | POA: Diagnosis not present

## 2020-07-23 DIAGNOSIS — I10 Essential (primary) hypertension: Secondary | ICD-10-CM | POA: Diagnosis not present

## 2020-07-23 DIAGNOSIS — R21 Rash and other nonspecific skin eruption: Secondary | ICD-10-CM | POA: Diagnosis not present

## 2020-07-23 MED ORDER — TRIAMCINOLONE ACETONIDE 0.1 % EX CREA: 1.0000 "application " | TOPICAL_CREAM | Freq: Two times a day (BID) | CUTANEOUS | 2 refills | Status: DC

## 2020-07-23 NOTE — Progress Notes (Signed)
   Subjective:    Patient ID: [name], female    DOB: June 22, 1928, 87 y.o.   MRN: [id number]  HPI Here with her daughter and father for several issues. First she developed a red itchy rash on both wrists and both ankles about one week ago. This caused a lot of swelling at first but the swelling has improved over the past 2 days. No SOB. They have applied rubbing alcohol and Aspercreme. When I asked about any new medications, her daughter said one week ago she switched from giving her Tylenol at bedtime to Tylenol PM. Also earlier this morning she had been napping in bed and she went to the bathroom by herself. Her husband heard a noise in the bathroom and went in to check her. He found her on the floor slumped up against the wall. She was unable to communicate with him, so he called EMS. No shaking or clenching was seen. No loss of bowel or bladder control. By the time EMS arrived she was back to normal and able to have a conversation. Her exam and vital signs were normal, so the EMS left and asked her to see [place] today. She has felt fine ever since.    Review of Systems  Constitutional: Negative.   Respiratory: Negative.   Cardiovascular: Positive for leg swelling. Negative for chest pain and palpitations.  Gastrointestinal: Negative.   Genitourinary: Negative.   Skin: Positive for rash.  Neurological: Positive for syncope and speech difficulty.       Objective:   Physical Exam Constitutional:      Comments: In her wheelchair, she seems to be at her baseline   Cardiovascular:     Rate and Rhythm: Normal rate and regular rhythm.     Pulses: Normal pulses.     Heart sounds: Normal heart sounds.  Pulmonary:     Effort: Pulmonary effort is normal.     Breath sounds: Normal breath sounds.  Musculoskeletal:     Comments: 2+ edema on both lower legs  Skin:    Comments: There are patches of maculopapular erythema on both wrists and both ankles  Neurological:     Mental Status: She is  alert and oriented to person, place, and time. Mental status is at baseline.           Assessment & Plan:  She had a syncopal episode this morning which sounds like it was postmicturation and vasovagal in nature. She has been fine ever since. The family will contact us if she has any further spells. The rash is an allergic reaction, most likely to Diphenhydramine, so they will discontinue the Tylenol PM. They will apply Triamcinolone cream BID until this clears.  Gershon Crane, MD

## 2020-07-23 NOTE — Telephone Encounter (Signed)
We will see her later today

## 2020-07-23 NOTE — Telephone Encounter (Addendum)
Patient's daughter and husband called in this morning stating earlier this morning patient was found in the bathroom leaning up against the wall unable to communicate appropriately and unable to walk. This lasted for nearly 10 minutes. They did call EMS but patient normalized upon their arrival vital signs were normal and patient was not transported.  Patient is back to baseline at present time.  Also concerned regarding lower extremity swelling which family states a rash has developed on the ankle area bilaterally - they are putting rubbing alcohol, aspercreme on the legs to help with pain of the lower legs.  Discussed above with Rudi Coco NP - directed to see primary care today to further workup patient for above complaints ?TIA, underlying infection. Daughter and husband stated they would contact PCP this morning to be seen.

## 2020-09-10 ENCOUNTER — Encounter: Payer: Self-pay | Admitting: Neurology

## 2020-09-10 ENCOUNTER — Ambulatory Visit: Payer: Medicare PPO | Admitting: Neurology

## 2020-09-10 ENCOUNTER — Other Ambulatory Visit: Payer: Self-pay

## 2020-09-10 ENCOUNTER — Other Ambulatory Visit (INDEPENDENT_AMBULATORY_CARE_PROVIDER_SITE_OTHER): Payer: Medicare PPO

## 2020-09-10 VITALS — BP 178/81 | HR 95 | Resp 20 | Ht 61.5 in | Wt 106.0 lb

## 2020-09-10 DIAGNOSIS — R55 Syncope and collapse: Secondary | ICD-10-CM

## 2020-09-10 DIAGNOSIS — F0391 Unspecified dementia with behavioral disturbance: Secondary | ICD-10-CM

## 2020-09-10 DIAGNOSIS — F03B18 Unspecified dementia, moderate, with other behavioral disturbance: Secondary | ICD-10-CM

## 2020-09-10 LAB — VITAMIN B12: Vitamin B-12: 214 pg/mL (ref 211–911)

## 2020-09-10 MED ORDER — ESCITALOPRAM OXALATE 5 MG PO TABS: 5.0000 mg | ORAL_TABLET | Freq: Every day | ORAL | 11 refills | Status: DC

## 2020-09-10 NOTE — Patient Instructions (Addendum)
1. Start Lexapro 5mg  daily  2. Schedule head CT without contrast  3. Schedule EEG  4. Continue Aricept 5mg  daily  5. Recommend increasing supervision at home  6. Follow-up in 6 months, call for any changes   FALL PRECAUTIONS: Be cautious when walking. Scan the area for obstacles that may increase the risk of trips and falls. When getting up in the mornings, sit up at the edge of the bed for a few minutes before getting out of bed. Consider elevating the bed at the head end to avoid drop of blood pressure when getting up. Walk always in a well-lit room (use night lights in the walls). Avoid area rugs or power cords from appliances in the middle of the walkways. Use a walker or a cane if necessary and consider physical therapy for balance exercise. Get your eyesight checked regularly.  HOME SAFETY: Consider the safety of the kitchen when operating appliances like stoves, microwave oven, and blender. Consider having supervision and share cooking responsibilities until no longer able to participate in those. Accidents with firearms and other hazards in the house should be identified and addressed as well.  ABILITY TO BE LEFT ALONE: If patient is unable to contact 911 operator, consider using LifeLine, or when the need is there, arrange for someone to stay with patients. Smoking is a fire hazard, consider supervision or cessation. Risk of wandering should be assessed by caregiver and if detected at any point, supervision and safe proof recommendations should be instituted.  MEDICATION SUPERVISION: Inability to self-administer medication needs to be constantly addressed. Implement a mechanism to ensure safe administration of the medications.  RECOMMENDATIONS FOR ALL PATIENTS WITH MEMORY PROBLEMS: 1. Continue to exercise (Recommend 30 minutes of walking everyday, or 3 hours every week) 2. Increase social interactions - continue going to [place] and enjoy social gatherings with friends and family 3.  Eat healthy, avoid fried foods and eat more fruits and vegetables 4. Maintain adequate blood pressure, blood sugar, and blood cholesterol level. Reducing the risk of stroke and cardiovascular disease also helps promoting better memory. 5. Avoid stressful situations. Live a simple life and avoid aggravations. Organize your time and prepare for the next day in anticipation. 6. Sleep well, avoid any interruptions of sleep and avoid any distractions in the bedroom that may interfere with adequate sleep quality 7. Avoid sugar, avoid sweets as there is a strong link between excessive sugar intake, diabetes, and cognitive impairment The Mediterranean diet has been shown to help patients reduce the risk of progressive memory disorders and reduces cardiovascular risk. This includes eating fish, eat fruits and green leafy vegetables, nuts like almonds and hazelnuts, walnuts, and also use olive oil. Avoid fast foods and fried foods as much as possible. Avoid sweets and sugar as sugar use has been linked to worsening of memory function.  There is always a concern of gradual progression of memory problems. If this is the case, then we may need to adjust level of care according to patient needs. Support, both to the patient and caregiver, should then be put into place.   Your provider has requested that you have labwork completed today. Please go to [facility] ([id number]) on the second floor of this building before leaving the office today. You do not need to check in. If you are not called within 15 minutes please check with the front desk.  We have sent a referral to [facility] for your CT and they will call you directly to schedule  your appointment. They are located at [address] [facility]. If you need to contact them directly please call [phone].
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
## 2020-09-11 ENCOUNTER — Telehealth: Payer: Self-pay

## 2020-09-11 LAB — TSH: TSH: <0.01 u[IU]/mL — ABNORMAL LOW (ref 0.35–4.50)

## 2020-09-11 NOTE — Telephone Encounter (Signed)
Spoke with pt husband and informed him that thyroid test was abnormal, indicating possible hyperthyroid. She will need to follow-up with her PCP on this for further evaluation. He asked me to talk to their daughter. Daughter called no answer left a voice mail for her to call the office back,

## 2020-09-12 ENCOUNTER — Telehealth: Payer: Self-pay | Admitting: Neurology

## 2020-09-12 NOTE — Telephone Encounter (Signed)
Daughter called in, wants to speak with Herbert Seta about blood work.

## 2020-09-15 ENCOUNTER — Other Ambulatory Visit (HOSPITAL_COMMUNITY): Payer: Self-pay | Admitting: Nurse Practitioner

## 2020-09-18 ENCOUNTER — Other Ambulatory Visit: Payer: Self-pay

## 2020-09-18 ENCOUNTER — Ambulatory Visit (INDEPENDENT_AMBULATORY_CARE_PROVIDER_SITE_OTHER): Payer: Medicare PPO | Admitting: Neurology

## 2020-09-18 DIAGNOSIS — F0391 Unspecified dementia with behavioral disturbance: Secondary | ICD-10-CM | POA: Diagnosis not present

## 2020-09-18 DIAGNOSIS — F03B18 Unspecified dementia, moderate, with other behavioral disturbance: Secondary | ICD-10-CM

## 2020-09-18 DIAGNOSIS — R55 Syncope and collapse: Secondary | ICD-10-CM

## 2020-09-21 ENCOUNTER — Other Ambulatory Visit: Payer: Self-pay | Admitting: Family Medicine

## 2020-09-23 ENCOUNTER — Telehealth: Payer: Self-pay

## 2020-09-23 NOTE — Procedures (Signed)
ELECTROENCEPHALOGRAM REPORT  Date of Study: 09/18/2020  Patient's Name: [name] MRN: [id number] Date of Birth: June 02, 1928  Referring Provider: Dr. Patrcia Dolly  Clinical History: This is an 87 year old woman with dementia, episodes of shaking with loss of consciousness. EEG for classification.  Medications: TYLENOL 500 MG tablet COMBIGAN 0.2-0.5 % ophthalmic solution ARICEPT 5 MG tablet ELIQUIS 2.5 MG TABS tablet LASIX 20 MG tablet FLUAD QUADRIVALENT 0.5 ML injection LUMIGAN 0.01 % SOLN ILEVRO 0.3 % ophthalmic suspension KENALOG 0.1 %  Technical Summary: A multichannel digital EEG recording measured by the international 10-20 system with electrodes applied with paste and impedances below 5000 ohms performed in our laboratory with EKG monitoring in an awake and asleep patient.  Hyperventilation was not performed. Photic stimulation was performed.  The digital EEG was referentially recorded, reformatted, and digitally filtered in a variety of bipolar and referential montages for optimal display.    Description: The patient is awake and asleep during the recording.  During maximal wakefulness, there is a symmetric, medium voltage 9 Hz posterior dominant rhythm that attenuates with eye opening.  The record is symmetric.  During drowsiness and sleep, there is an increase in theta slowing of the background.  Vertex waves and symmetric sleep spindles were seen.  Photic stimulation did not elicit any abnormalities.  There were no epileptiform discharges or electrographic seizures seen.    EKG lead was unremarkable.  Impression: This awake and asleep EEG is normal.    Clinical Correlation: A normal EEG does not exclude a clinical diagnosis of epilepsy.  If further clinical questions remain, prolonged EEG may be helpful.  Clinical correlation is advised.   Patrcia Dolly, M.D.

## 2020-09-23 NOTE — Telephone Encounter (Signed)
Spoke with pt daughter brain wave test was normal, no seizure activity seen. At this point, if she has another shaking episode, if they could check her blood pressure during that time, it may be helpful moving forward. Would not start seizure medication fornow.

## 2020-10-19 ENCOUNTER — Emergency Department (HOSPITAL_COMMUNITY): Payer: Medicare PPO

## 2020-10-19 ENCOUNTER — Other Ambulatory Visit: Payer: Self-pay

## 2020-10-19 ENCOUNTER — Inpatient Hospital Stay (HOSPITAL_COMMUNITY)
Admission: EM | Admit: 2020-10-19 | Discharge: 2020-10-28 | DRG: 536 | Disposition: A | Discharge status: Skilled Nursing Facility | Payer: Medicare PPO | Attending: Internal Medicine | Admitting: Internal Medicine

## 2020-10-19 ENCOUNTER — Encounter (HOSPITAL_COMMUNITY): Payer: Self-pay

## 2020-10-19 DIAGNOSIS — Y92000 Kitchen of unspecified non-institutional (private) residence as  the place of occurrence of the external cause: Secondary | ICD-10-CM

## 2020-10-19 DIAGNOSIS — W010XXA Fall on same level from slipping, tripping and stumbling without subsequent striking against object, initial encounter: Secondary | ICD-10-CM | POA: Diagnosis present

## 2020-10-19 DIAGNOSIS — Z8781 Personal history of (healed) traumatic fracture: Secondary | ICD-10-CM

## 2020-10-19 DIAGNOSIS — F0390 Unspecified dementia without behavioral disturbance: Secondary | ICD-10-CM | POA: Diagnosis present

## 2020-10-19 DIAGNOSIS — I4891 Unspecified atrial fibrillation: Secondary | ICD-10-CM | POA: Diagnosis not present

## 2020-10-19 DIAGNOSIS — W19XXXD Unspecified fall, subsequent encounter: Secondary | ICD-10-CM | POA: Diagnosis not present

## 2020-10-19 DIAGNOSIS — W19XXXA Unspecified fall, initial encounter: Secondary | ICD-10-CM

## 2020-10-19 DIAGNOSIS — E1122 Type 2 diabetes mellitus with diabetic chronic kidney disease: Secondary | ICD-10-CM | POA: Diagnosis present

## 2020-10-19 DIAGNOSIS — S0990XA Unspecified injury of head, initial encounter: Secondary | ICD-10-CM | POA: Diagnosis not present

## 2020-10-19 DIAGNOSIS — I878 Other specified disorders of veins: Secondary | ICD-10-CM | POA: Diagnosis not present

## 2020-10-19 DIAGNOSIS — N1831 Chronic kidney disease, stage 3a: Secondary | ICD-10-CM | POA: Diagnosis not present

## 2020-10-19 DIAGNOSIS — Z20822 Contact with and (suspected) exposure to covid-19: Secondary | ICD-10-CM | POA: Diagnosis not present

## 2020-10-19 DIAGNOSIS — I129 Hypertensive chronic kidney disease with stage 1 through stage 4 chronic kidney disease, or unspecified chronic kidney disease: Secondary | ICD-10-CM | POA: Diagnosis present

## 2020-10-19 DIAGNOSIS — S199XXA Unspecified injury of neck, initial encounter: Secondary | ICD-10-CM | POA: Diagnosis not present

## 2020-10-19 DIAGNOSIS — R55 Syncope and collapse: Secondary | ICD-10-CM | POA: Diagnosis not present

## 2020-10-19 DIAGNOSIS — S72001A Fracture of unspecified part of neck of right femur, initial encounter for closed fracture: Secondary | ICD-10-CM | POA: Diagnosis not present

## 2020-10-19 DIAGNOSIS — M9701XA Periprosthetic fracture around internal prosthetic right hip joint, initial encounter: Secondary | ICD-10-CM | POA: Diagnosis not present

## 2020-10-19 DIAGNOSIS — M9701XD Periprosthetic fracture around internal prosthetic right hip joint, subsequent encounter: Secondary | ICD-10-CM | POA: Diagnosis not present

## 2020-10-19 DIAGNOSIS — R531 Weakness: Secondary | ICD-10-CM | POA: Diagnosis not present

## 2020-10-19 DIAGNOSIS — M6281 Muscle weakness (generalized): Secondary | ICD-10-CM | POA: Diagnosis not present

## 2020-10-19 DIAGNOSIS — M2550 Pain in unspecified joint: Secondary | ICD-10-CM | POA: Diagnosis not present

## 2020-10-19 DIAGNOSIS — M25551 Pain in right hip: Secondary | ICD-10-CM | POA: Diagnosis present

## 2020-10-19 DIAGNOSIS — I480 Paroxysmal atrial fibrillation: Secondary | ICD-10-CM | POA: Diagnosis present

## 2020-10-19 DIAGNOSIS — S7224XA Nondisplaced subtrochanteric fracture of right femur, initial encounter for closed fracture: Principal | ICD-10-CM | POA: Diagnosis present

## 2020-10-19 DIAGNOSIS — J9811 Atelectasis: Secondary | ICD-10-CM | POA: Diagnosis not present

## 2020-10-19 DIAGNOSIS — M25562 Pain in left knee: Secondary | ICD-10-CM | POA: Diagnosis not present

## 2020-10-19 DIAGNOSIS — R41841 Cognitive communication deficit: Secondary | ICD-10-CM | POA: Diagnosis not present

## 2020-10-19 DIAGNOSIS — Z96641 Presence of right artificial hip joint: Secondary | ICD-10-CM | POA: Diagnosis not present

## 2020-10-19 DIAGNOSIS — M9711XA Periprosthetic fracture around internal prosthetic right knee joint, initial encounter: Secondary | ICD-10-CM | POA: Diagnosis present

## 2020-10-19 DIAGNOSIS — S79911A Unspecified injury of right hip, initial encounter: Secondary | ICD-10-CM | POA: Diagnosis not present

## 2020-10-19 DIAGNOSIS — R609 Edema, unspecified: Secondary | ICD-10-CM | POA: Diagnosis not present

## 2020-10-19 DIAGNOSIS — I10 Essential (primary) hypertension: Secondary | ICD-10-CM | POA: Diagnosis not present

## 2020-10-19 DIAGNOSIS — R9431 Abnormal electrocardiogram [ECG] [EKG]: Secondary | ICD-10-CM | POA: Diagnosis not present

## 2020-10-19 DIAGNOSIS — M1611 Unilateral primary osteoarthritis, right hip: Secondary | ICD-10-CM | POA: Diagnosis not present

## 2020-10-19 DIAGNOSIS — Z8249 Family history of ischemic heart disease and other diseases of the circulatory system: Secondary | ICD-10-CM | POA: Diagnosis not present

## 2020-10-19 DIAGNOSIS — Z7901 Long term (current) use of anticoagulants: Secondary | ICD-10-CM

## 2020-10-19 DIAGNOSIS — I6529 Occlusion and stenosis of unspecified carotid artery: Secondary | ICD-10-CM | POA: Diagnosis not present

## 2020-10-19 DIAGNOSIS — M7989 Other specified soft tissue disorders: Secondary | ICD-10-CM | POA: Diagnosis not present

## 2020-10-19 DIAGNOSIS — Z7401 Bed confinement status: Secondary | ICD-10-CM | POA: Diagnosis not present

## 2020-10-19 DIAGNOSIS — R2689 Other abnormalities of gait and mobility: Secondary | ICD-10-CM | POA: Diagnosis not present

## 2020-10-19 DIAGNOSIS — E46 Unspecified protein-calorie malnutrition: Secondary | ICD-10-CM | POA: Diagnosis not present

## 2020-10-19 DIAGNOSIS — Z471 Aftercare following joint replacement surgery: Secondary | ICD-10-CM | POA: Diagnosis not present

## 2020-10-19 DIAGNOSIS — I700 Atherosclerosis of aorta: Secondary | ICD-10-CM | POA: Diagnosis not present

## 2020-10-19 LAB — BASIC METABOLIC PANEL
Anion gap: 8 (ref 5–15)
BUN: 25 mg/dL — ABNORMAL HIGH (ref 8–23)
CO2: 24 mmol/L (ref 22–32)
Calcium: 9.2 mg/dL (ref 8.9–10.3)
Chloride: 105 mmol/L (ref 98–111)
Creatinine, Ser: 1.2 mg/dL — ABNORMAL HIGH (ref 0.44–1.00)
GFR, Estimated: 43 mL/min — ABNORMAL LOW (ref >60)
Glucose, Bld: 139 mg/dL — ABNORMAL HIGH (ref 70–99)
Potassium: 3.9 mmol/L (ref 3.5–5.1)
Sodium: 137 mmol/L (ref 135–145)

## 2020-10-19 LAB — CBC WITH DIFFERENTIAL/PLATELET
Abs Immature Granulocytes: 0.02 10*3/uL (ref 0.00–0.07)
Basophils Absolute: 0 10*3/uL (ref 0.0–0.1)
Basophils Relative: 0 %
Eosinophils Absolute: 0.1 10*3/uL (ref 0.0–0.5)
Eosinophils Relative: 1 %
HCT: 32.4 % — ABNORMAL LOW (ref 36.0–46.0)
Hemoglobin: 10.9 g/dL — ABNORMAL LOW (ref 12.0–15.0)
Immature Granulocytes: 0 %
Lymphocytes Relative: 30 %
Lymphs Abs: 1.4 10*3/uL (ref 0.7–4.0)
MCH: 32.1 pg (ref 26.0–34.0)
MCHC: 33.6 g/dL (ref 30.0–36.0)
MCV: 95.3 fL (ref 80.0–100.0)
Monocytes Absolute: 0.4 10*3/uL (ref 0.1–1.0)
Monocytes Relative: 9 %
Neutro Abs: 2.8 10*3/uL (ref 1.7–7.7)
Neutrophils Relative %: 60 %
Platelets: 142 10*3/uL — ABNORMAL LOW (ref 150–400)
RBC: 3.4 MIL/uL — ABNORMAL LOW (ref 3.87–5.11)
RDW: 12.2 % (ref 11.5–15.5)
WBC: 4.7 10*3/uL (ref 4.0–10.5)
nRBC: 0 % (ref 0.0–0.2)

## 2020-10-19 LAB — RESP PANEL BY RT-PCR (FLU A&B, COVID) ARPGX2
Influenza A by PCR: NEGATIVE
Influenza B by PCR: NEGATIVE
SARS Coronavirus 2 by RT PCR: NEGATIVE

## 2020-10-19 MED ORDER — INSULIN ASPART 100 UNIT/ML IJ SOLN
0.0000 [IU] | Freq: Three times a day (TID) | INTRAMUSCULAR | Status: DC
  Administered 2020-10-20: 1 [IU] via SUBCUTANEOUS
  Administered 2020-10-21 (×2): 2 [IU] via SUBCUTANEOUS
  Administered 2020-10-22: 1 [IU] via SUBCUTANEOUS
  Administered 2020-10-22 – 2020-10-24 (×3): 2 [IU] via SUBCUTANEOUS
  Administered 2020-10-24: 1 [IU] via SUBCUTANEOUS
  Administered 2020-10-24 – 2020-10-25 (×2): 2 [IU] via SUBCUTANEOUS
  Administered 2020-10-27: 1 [IU] via SUBCUTANEOUS
  Administered 2020-10-28: 2 [IU] via SUBCUTANEOUS

## 2020-10-19 MED ORDER — ONDANSETRON HCL 4 MG/2ML IJ SOLN: 4.0000 mg | Freq: Four times a day (QID) | INTRAMUSCULAR | Status: DC | PRN

## 2020-10-19 MED ORDER — HYDROCODONE-ACETAMINOPHEN 5-325 MG PO TABS: 1.0000 | ORAL_TABLET | Freq: Four times a day (QID) | ORAL | Status: DC | PRN

## 2020-10-19 MED ORDER — MORPHINE SULFATE (PF) 2 MG/ML IV SOLN: 0.5000 mg | INTRAVENOUS | Status: DC | PRN

## 2020-10-19 MED ORDER — MELATONIN 3 MG PO TABS
3.0000 mg | ORAL_TABLET | Freq: Every evening | ORAL | Status: DC | PRN
  Administered 2020-10-23: 3 mg via ORAL
  Filled 2020-10-19: qty 1

## 2020-10-19 MED ORDER — INSULIN ASPART 100 UNIT/ML IJ SOLN
0.0000 [IU] | Freq: Every day | INTRAMUSCULAR | Status: DC
  Administered 2020-10-20: 1 [IU] via SUBCUTANEOUS

## 2020-10-19 MED ORDER — ACETAMINOPHEN 325 MG PO TABS
650.0000 mg | ORAL_TABLET | Freq: Four times a day (QID) | ORAL | Status: DC | PRN
  Administered 2020-10-22: 650 mg via ORAL
  Filled 2020-10-19: qty 2

## 2020-10-19 MED ORDER — MORPHINE SULFATE (PF) 2 MG/ML IV SOLN: 2.0000 mg | INTRAVENOUS | Status: DC | PRN

## 2020-10-19 MED ORDER — SENNOSIDES-DOCUSATE SODIUM 8.6-50 MG PO TABS
2.0000 | ORAL_TABLET | Freq: Every day | ORAL | Status: DC
  Administered 2020-10-20 – 2020-10-27 (×8): 2 via ORAL
  Filled 2020-10-19 (×8): qty 2

## 2020-10-19 MED ORDER — METOPROLOL TARTRATE 5 MG/5ML IV SOLN: 2.5000 mg | Freq: Four times a day (QID) | INTRAVENOUS | Status: DC | PRN

## 2020-10-19 MED ORDER — METOPROLOL TARTRATE 12.5 MG HALF TABLET
12.5000 mg | ORAL_TABLET | Freq: Two times a day (BID) | ORAL | Status: DC
  Administered 2020-10-20 – 2020-10-28 (×14): 12.5 mg via ORAL
  Filled 2020-10-19 (×16): qty 1

## 2020-10-19 NOTE — H&P (Signed)
History and Physical  VIA ROSADO RUE:[id number] DOB: 05/28/1928 DOA: 10/19/2020  Referring physician: Dr. Rodena Medin, EDP. PCP: Nelwyn Salisbury, MD  Outpatient Specialists: Cardiology. Patient coming from: Home.   Chief Complaint: Fall.  HPI: [name] is a 87 y.o. female with medical history significant for paroxysmal A. fib on Eliquis, dementia, right total hip arthroplasty in 2005, who presented to [facility] ED after a mechanical fall at home.  She states she slipped on water and fell on her kitchen floor.  She hit her head and landed on her right hip.  No loss of consciousness.  She presented to the ED for further evaluation.  Work-up in the ED revealed right hip periprosthetic fracture.  EDP discussed case with orthopedic surgery Dr. Susa Simmonds.  Admitted for possible orthopedic surgery intervention.   ED Course:  Temperature 98.7, BP 174/75, pulse 82, respiration rate 15, O2 saturation 100% on room air.  Lab studies remarkable for serum sodium 137, potassium 3.9, serum bicarb 24, glucose 139, BUN 25, creatinine 1.20, GFR 43.  WBC 4.7, hemoglobin 10.9, MCV 95, platelet count 142.  Review of Systems: Review of systems as noted in the HPI. All other systems reviewed and are negative.  Past surgical history: Total hip arthroplasty in 2005 Abdominal hysterectomy Colonoscopy.  Past medical history: Type 2 diabetes Essential hypertension Paroxysmal A. fib Dementia   Social History:  reports that she has never smoked. She has never used smokeless tobacco. She reports that she does not drink alcohol and does not use drugs.   No Known Allergies  Family history: Father with history of coronary artery disease.  Prior to Admission medications   Medication Sig Start Date End Date Taking? Authorizing Provider  donepezil (ARICEPT) 5 MG tablet Take 5 mg by mouth daily. 09/23/20  Yes [provider]  ELIQUIS 2.5 MG TABS tablet Take 2.5 mg by mouth 2 (two) times daily. 10/18/20  Yes  [provider]    Physical Exam: BP (!) 174/75   Pulse 82   Temp 98.7 F (37.1 C) (Oral)   Resp 15   Ht 5' 1.5" (1.562 m)   Wt 54.4 kg   SpO2 100%   BMI 22.31 kg/m   General: 87 y.o. year-old female well developed well nourished in no acute distress.  Alert and pleasantly confused. Cardiovascular: Regular rate and rhythm with no rubs or gallops.  No thyromegaly or JVD noted.  No lower extremity edema. 2/4 pulses in all 4 extremities. Respiratory: Clear to auscultation with no wheezes or rales. Good inspiratory effort. Abdomen: Soft nontender nondistended with normal bowel sounds x4 quadrants. Muskuloskeletal: No cyanosis, clubbing or edema noted bilaterally Neuro: CN II-XII intact, strength, sensation, reflexes Skin: No ulcerative lesions noted or rashes Psychiatry: Judgement and insight appear normal. Mood is appropriate for condition and setting          Labs on Admission:  Basic Metabolic Panel: Recent Labs  Lab 10/19/20 1947  NA 137  K 3.9  CL 105  CO2 24  GLUCOSE 139*  BUN 25*  CREATININE 1.20*  CALCIUM 9.2   Liver Function Tests: No results for input(s): AST, ALT, ALKPHOS, BILITOT, PROT, ALBUMIN in the last 168 hours. No results for input(s): LIPASE, AMYLASE in the last 168 hours. No results for input(s): AMMONIA in the last 168 hours. CBC: Recent Labs  Lab 10/19/20 1947  WBC 4.7  NEUTROABS 2.8  HGB 10.9*  HCT 32.4*  MCV 95.3  PLT 142*   Cardiac Enzymes: No  results for input(s): CKTOTAL, CKMB, CKMBINDEX, TROPONINI in the last 168 hours.  BNP (last 3 results) No results for input(s): BNP in the last 8760 hours.  ProBNP (last 3 results) No results for input(s): PROBNP in the last 8760 hours.  CBG: No results for input(s): GLUCAP in the last 168 hours.  Radiological Exams on Admission: DG Chest 1 View  Result Date: 10/19/2020 CLINICAL DATA:  Fall EXAM: CHEST  1 VIEW COMPARISON:  Radiograph 06/24/2012 FINDINGS: No visible displaced rib  fractures or other acute traumatic abnormality of the chest wall or included shoulders. The osseous structures appear diffusely demineralized which may limit detection of small or nondisplaced fractures. Degenerative changes are present in the imaged spine and shoulders. Low lung volumes with atelectatic changes. Some chronically coarsened interstitial features noted as well. Stable cardiomediastinal contours with a calcified, tortuous aorta. No visible pneumothorax or layering effusion Telemetry leads overlie the chest. IMPRESSION: No visible acute traumatic findings in the chest or included shoulders. Low volumes and atelectasis. Chronically coarsened interstitial changes. Aortic Atherosclerosis (ICD10-I70.0). Electronically Signed   By: Kreg Shropshire M.D.   On: 10/19/2020 20:20   CT Head Wo Contrast  Result Date: 10/19/2020 CLINICAL DATA:  87 year old female with head trauma. EXAM: CT HEAD WITHOUT CONTRAST CT CERVICAL SPINE WITHOUT CONTRAST TECHNIQUE: Multidetector CT imaging of the head and cervical spine was performed following the standard protocol without intravenous contrast. Multiplanar CT image reconstructions of the cervical spine were also generated. COMPARISON:  Head CT dated 01/05/2020. FINDINGS: Evaluation of this exam is limited due to motion artifact. CT HEAD FINDINGS Brain: Mild age-related atrophy and moderate chronic microvascular ischemic changes. There is no acute intracranial hemorrhage. No mass effect or midline shift. No extra-axial fluid collection. Vascular: No hyperdense vessel or unexpected calcification. Atherosclerotic calcification of the cavernous ICA. Skull: Normal. Negative for fracture or focal lesion. Sinuses/Orbits: No acute finding. Other: None CT CERVICAL SPINE FINDINGS Alignment: No acute subluxation. There is straightening of normal cervical lordosis which may be positional or due to muscle spasm. Skull base and vertebrae: No acute fracture.  Osteopenia. Soft tissues and  spinal canal: No prevertebral fluid or swelling. No visible canal hematoma. Disc levels:  No acute findings.  Mild degenerative changes. Upper chest: Negative. Other: Advanced atherosclerotic calcification of the aortic arch as well as bilateral carotid bulb calcified plaques. There is heterogeneous appearance of the thyroid gland. IMPRESSION: 1. No acute intracranial pathology. 2. Age-related atrophy and chronic microvascular ischemic changes. 3. No acute/traumatic cervical spine pathology. Electronically Signed   By: Elgie Collard M.D.   On: 10/19/2020 20:56   CT Cervical Spine Wo Contrast  Result Date: 10/19/2020 CLINICAL DATA:  86 year old female with head trauma. EXAM: CT HEAD WITHOUT CONTRAST CT CERVICAL SPINE WITHOUT CONTRAST TECHNIQUE: Multidetector CT imaging of the head and cervical spine was performed following the standard protocol without intravenous contrast. Multiplanar CT image reconstructions of the cervical spine were also generated. COMPARISON:  Head CT dated 01/05/2020. FINDINGS: Evaluation of this exam is limited due to motion artifact. CT HEAD FINDINGS Brain: Mild age-related atrophy and moderate chronic microvascular ischemic changes. There is no acute intracranial hemorrhage. No mass effect or midline shift. No extra-axial fluid collection. Vascular: No hyperdense vessel or unexpected calcification. Atherosclerotic calcification of the cavernous ICA. Skull: Normal. Negative for fracture or focal lesion. Sinuses/Orbits: No acute finding. Other: None CT CERVICAL SPINE FINDINGS Alignment: No acute subluxation. There is straightening of normal cervical lordosis which may be positional or due to muscle spasm.  Skull base and vertebrae: No acute fracture.  Osteopenia. Soft tissues and spinal canal: No prevertebral fluid or swelling. No visible canal hematoma. Disc levels:  No acute findings.  Mild degenerative changes. Upper chest: Negative. Other: Advanced atherosclerotic calcification of the  aortic arch as well as bilateral carotid bulb calcified plaques. There is heterogeneous appearance of the thyroid gland. IMPRESSION: 1. No acute intracranial pathology. 2. Age-related atrophy and chronic microvascular ischemic changes. 3. No acute/traumatic cervical spine pathology. Electronically Signed   By: Elgie Collard M.D.   On: 10/19/2020 20:56   CT Hip Right Wo Contrast  Result Date: 10/19/2020 CLINICAL DATA:  87 year old female with trauma to the right hip. EXAM: CT OF THE RIGHT HIP WITHOUT CONTRAST TECHNIQUE: Multidetector CT imaging of the right hip was performed according to the standard protocol. Multiplanar CT image reconstructions were also generated. COMPARISON:  Right hip radiograph dated 10/19/2020. FINDINGS: Evaluation is very limited due to streak artifact caused by right hip arthroplasty. Bones/Joint/Cartilage There is a total right hip arthroplasty. The arthroplasty components appear intact and in anatomic alignment. There is a nondisplaced fracture of the proximal femur involving the subtrochanteric region (coronal 60/6). No evidence of loosening. The bones are osteopenic. No dislocation. Ligaments Suboptimally assessed by CT. Muscles and Tendons Mild intramuscular edema. No fluid collection or hematoma. Soft tissues There is diffuse subcutaneous edema. No fluid collection hematoma. Aortoiliac atherosclerotic disease. IMPRESSION: 1. Nondisplaced subtrochanteric fracture of the right femur. No dislocation. 2. Total right hip arthroplasty. No evidence of loosening. 3. Aortic Atherosclerosis (ICD10-I70.0). Electronically Signed   By: Elgie Collard M.D.   On: 10/19/2020 21:40   DG Hip Unilat W or Wo Pelvis 2-3 Views Right  Result Date: 10/19/2020 CLINICAL DATA:  Radiograph 12/05/2018 EXAM: DG HIP (WITH OR WITHOUT PELVIS) 2-3V RIGHT COMPARISON:  Radiograph 12/05/2018 FINDINGS: Prior total right hip arthroplasty. Hardware remains in expected alignment however there is conspicuous  cortical step-off towards the collared femoral stem which could reflect a periprosthetic hardware fracture. Additionally, cortical thickening along the medial proximal femoral diaphysis towards the terminus of the stem could reflect some stress reaction related change. More chronic, corticated fragmentation along the acetabular cup is unchanged from comparison. Overlying soft tissue swelling of the right hip is present. No other acute traumatic findings in the bony pelvis. Left proximal femur is intact and normally located. Degenerative changes in the spine, hips and pelvis. Extensive calcified phleboliths. Mild edematous soft tissue changes more diffusely as well. IMPRESSION: Appearance concerning for a periprosthetic fracture involving the proximal portion of the collared femoral stem of a total right hip arthroplasty. Hardware remains otherwise intact and aligned. Stress reaction with cortical thickening along medial aspect of the proximal femoral diaphysis near the terminus of the femoral stem. Degenerative changes in the spine hips and pelvis. Diffuse bony demineralization. Electronically Signed   By: Kreg Shropshire M.D.   On: 10/19/2020 20:22    EKG: I independently viewed the EKG done and my findings are as followed: Sinus rhythm rate of 85.  Nonspecific ST-T changes.  QTc 453.  Assessment/Plan Present on Admission: **None**  Active Problems:   S/P right hip fracture  Right periprosthetic hip fracture post mechanical fall Reports slipping on water and falling in her kitchen. Seen on x-ray and confirmed on CT scan. EDP discussed case with orthopedic surgery Dr. Susa Simmonds Hold off home Eliquis.  Last dose was taken on 10/19/20 AM.  Paroxysmal A. fib Hold off home Eliquis due to possible orthopedic surgical intervention. Closely monitor  on telemetry. Add Lopressor 12.5 mg twice daily for rate control.  Dementia Reorient as needed, high risk of delirium.  Hypertension BP is currently not at goal  pain, elevated Added p.o. Lopressor 12.5 mg twice daily. Closely monitor vital signs IV antihypertensive as needed with parameters.   DVT prophylaxis: SCDs   Code Status: Full code  Family Communication: None at bedside.  Updated her husband via phone on 10/20/2020.  Disposition Plan: Admitted to telemetry surgical  Consults called: Orthopedic surgery consulted by EDP  Admission status: Inpatient status.  Patient will require at least 2 midnights for further evaluation and treatment of present condition.   Status is: Inpatient    Dispo:  Patient From: Home  Planned Disposition: Skilled Nursing Facility if qualifies possibly on 10/21/2020.  Medically stable for discharge: No         Darlin Droparole N Atalaya Zappia MD [facility] Pager [phone]  If 7PM-7AM, please contact night-coverage [email or web address] Password [email or web address] [email or web address]  10/19/2020, 10:23 PM

## 2020-10-19 NOTE — ED Notes (Signed)
Attempted to call report; RN unavailable at this time.   

## 2020-10-19 NOTE — ED Notes (Signed)
Attempted to call report without success

## 2020-10-19 NOTE — ED Provider Notes (Signed)
[facility] EMERGENCY DEPARTMENT Provider Note   CSN: [id number] Arrival date & time: 10/19/20  1928     History Chief Complaint  Patient presents with   Fall    Patient falling and having syncopal episodes recently; Pt complains of BIL knee pain; +thinners    [name] is a 87 y.o. female.  87 year old female with prior medical history as detailed below presents for evaluation.  Patient reports fall at home.  She lost balance and fell on her kitchen floor.  She does take Eliquis.  She did strike her head.  She did not pass out.  She did land hard on the right hip.  She complains of pain to the right hip area.  She is status post right total hip replacement.  She was unable to stand or ambulate after the fall secondary to pain in the right hip.  Upon arrival to the ED she appears comfortable.   The history is provided by the patient and medical records.  Fall This is a new problem. The current episode started less than 1 hour ago. The problem occurs every several days. The problem has not changed since onset.Pertinent negatives include no chest pain and no abdominal pain. Nothing aggravates the symptoms. Nothing relieves the symptoms.      History reviewed. No pertinent past medical history.  There are no problems to display for this patient.   History reviewed. No pertinent surgical history.   OB History   No obstetric history on file.     History reviewed. No pertinent family history.  Social History   Tobacco Use   Smoking status: Never   Smokeless tobacco: Never  Vaping Use   Vaping Use: Never used  Substance Use Topics   Alcohol use: Never   Drug use: Never    Home Medications Prior to Admission medications   Medication Sig Start Date End Date Taking? Authorizing Provider  donepezil (ARICEPT) 5 MG tablet Take 5 mg by mouth daily. 09/23/20  Yes [provider]  ELIQUIS 2.5 MG TABS tablet Take 2.5 mg by mouth 2 (two) times  daily. 10/18/20  Yes [provider]    Allergies    Patient has no known allergies.  Review of Systems   Review of Systems  Cardiovascular:  Negative for chest pain.  Gastrointestinal:  Negative for abdominal pain.  All other systems reviewed and are negative.  Physical Exam Updated Vital Signs BP (!) 173/78   Pulse 80   Temp 98.7 F (37.1 C) (Oral)   Resp 16   Ht 5' 1.5" (1.562 m)   Wt 54.4 kg   SpO2 100%   BMI 22.31 kg/m   Physical Exam Vitals and nursing note reviewed.  Constitutional:      General: She is not in acute distress.    Appearance: Normal appearance. She is well-developed.  HENT:     Head: Normocephalic and atraumatic.  Eyes:     Conjunctiva/sclera: Conjunctivae normal.     Pupils: Pupils are equal, round, and reactive to light.  Cardiovascular:     Rate and Rhythm: Normal rate and regular rhythm.     Heart sounds: Normal heart sounds.  Pulmonary:     Effort: Pulmonary effort is normal. No respiratory distress.     Breath sounds: Normal breath sounds.  Abdominal:     General: There is no distension.     Palpations: Abdomen is soft.     Tenderness: There is no abdominal tenderness.  Musculoskeletal:        General: Tenderness present. No deformity. Normal range of motion.     Cervical back: Normal range of motion and neck supple.     Comments: Tender with palpation to lateral aspect of right hip  Skin:    General: Skin is warm and dry.  Neurological:     General: No focal deficit present.     Mental Status: She is alert and oriented to person, place, and time.    ED Results / Procedures / Treatments   Labs (all labs ordered are listed, but only abnormal results are displayed) Labs Reviewed  BASIC METABOLIC PANEL - Abnormal; Notable for the following components:      Result Value   Glucose, Bld 139 (*)    BUN 25 (*)    Creatinine, Ser 1.20 (*)    GFR, Estimated 43 (*)    All other components within normal limits  CBC WITH  DIFFERENTIAL/PLATELET - Abnormal; Notable for the following components:   RBC 3.40 (*)    Hemoglobin 10.9 (*)    HCT 32.4 (*)    Platelets 142 (*)    All other components within normal limits  RESP PANEL BY RT-PCR (FLU A&B, COVID) [id number]    EKG EKG Interpretation  Date/Time:  Saturday October 19 2020 19:31:16 EDT Ventricular Rate:  85 PR Interval:  179 QRS Duration: 84 QT Interval:  381 QTC Calculation: 453 R Axis:   31 Text Interpretation: Sinus rhythm Left atrial enlargement LVH with secondary repolarization abnormality Anterior Q waves, possibly due to LVH Confirmed by Kristine RoyalMessick, Lucindy Borel [phone]) on 10/19/2020 7:44:20 PM  Radiology DG Chest 1 View  Result Date: 10/19/2020 CLINICAL DATA:  Fall EXAM: CHEST  1 VIEW COMPARISON:  Radiograph 06/24/2012 FINDINGS: No visible displaced rib fractures or other acute traumatic abnormality of the chest wall or included shoulders. The osseous structures appear diffusely demineralized which may limit detection of small or nondisplaced fractures. Degenerative changes are present in the imaged spine and shoulders. Low lung volumes with atelectatic changes. Some chronically coarsened interstitial features noted as well. Stable cardiomediastinal contours with a calcified, tortuous aorta. No visible pneumothorax or layering effusion Telemetry leads overlie the chest. IMPRESSION: No visible acute traumatic findings in the chest or included shoulders. Low volumes and atelectasis. Chronically coarsened interstitial changes. Aortic Atherosclerosis (ICD10-I70.0). Electronically Signed   By: Kreg ShropshirePrice  DeHay M.D.   On: 10/19/2020 20:20   CT Head Wo Contrast  Result Date: 10/19/2020 CLINICAL DATA:  87 year old female with head trauma. EXAM: CT HEAD WITHOUT CONTRAST CT CERVICAL SPINE WITHOUT CONTRAST TECHNIQUE: Multidetector CT imaging of the head and cervical spine was performed following the standard protocol without intravenous contrast. Multiplanar CT image reconstructions  of the cervical spine were also generated. COMPARISON:  Head CT dated 01/05/2020. FINDINGS: Evaluation of this exam is limited due to motion artifact. CT HEAD FINDINGS Brain: Mild age-related atrophy and moderate chronic microvascular ischemic changes. There is no acute intracranial hemorrhage. No mass effect or midline shift. No extra-axial fluid collection. Vascular: No hyperdense vessel or unexpected calcification. Atherosclerotic calcification of the cavernous ICA. Skull: Normal. Negative for fracture or focal lesion. Sinuses/Orbits: No acute finding. Other: None CT CERVICAL SPINE FINDINGS Alignment: No acute subluxation. There is straightening of normal cervical lordosis which may be positional or due to muscle spasm. Skull base and vertebrae: No acute fracture.  Osteopenia. Soft tissues and spinal canal: No prevertebral fluid or swelling. No visible canal hematoma. Disc levels:  No acute findings.  Mild degenerative changes. Upper chest: Negative. Other: Advanced atherosclerotic calcification of the aortic arch as well as bilateral carotid bulb calcified plaques. There is heterogeneous appearance of the thyroid gland. IMPRESSION: 1. No acute intracranial pathology. 2. Age-related atrophy and chronic microvascular ischemic changes. 3. No acute/traumatic cervical spine pathology. Electronically Signed   By: Elgie Collard M.D.   On: 10/19/2020 20:56   CT Cervical Spine Wo Contrast  Result Date: 10/19/2020 CLINICAL DATA:  87 year old female with head trauma. EXAM: CT HEAD WITHOUT CONTRAST CT CERVICAL SPINE WITHOUT CONTRAST TECHNIQUE: Multidetector CT imaging of the head and cervical spine was performed following the standard protocol without intravenous contrast. Multiplanar CT image reconstructions of the cervical spine were also generated. COMPARISON:  Head CT dated 01/05/2020. FINDINGS: Evaluation of this exam is limited due to motion artifact. CT HEAD FINDINGS Brain: Mild age-related atrophy and moderate  chronic microvascular ischemic changes. There is no acute intracranial hemorrhage. No mass effect or midline shift. No extra-axial fluid collection. Vascular: No hyperdense vessel or unexpected calcification. Atherosclerotic calcification of the cavernous ICA. Skull: Normal. Negative for fracture or focal lesion. Sinuses/Orbits: No acute finding. Other: None CT CERVICAL SPINE FINDINGS Alignment: No acute subluxation. There is straightening of normal cervical lordosis which may be positional or due to muscle spasm. Skull base and vertebrae: No acute fracture.  Osteopenia. Soft tissues and spinal canal: No prevertebral fluid or swelling. No visible canal hematoma. Disc levels:  No acute findings.  Mild degenerative changes. Upper chest: Negative. Other: Advanced atherosclerotic calcification of the aortic arch as well as bilateral carotid bulb calcified plaques. There is heterogeneous appearance of the thyroid gland. IMPRESSION: 1. No acute intracranial pathology. 2. Age-related atrophy and chronic microvascular ischemic changes. 3. No acute/traumatic cervical spine pathology. Electronically Signed   By: Elgie Collard M.D.   On: 10/19/2020 20:56   CT Hip Right Wo Contrast  Result Date: 10/19/2020 CLINICAL DATA:  87 year old female with trauma to the right hip. EXAM: CT OF THE RIGHT HIP WITHOUT CONTRAST TECHNIQUE: Multidetector CT imaging of the right hip was performed according to the standard protocol. Multiplanar CT image reconstructions were also generated. COMPARISON:  Right hip radiograph dated 10/19/2020. FINDINGS: Evaluation is very limited due to streak artifact caused by right hip arthroplasty. Bones/Joint/Cartilage There is a total right hip arthroplasty. The arthroplasty components appear intact and in anatomic alignment. There is a nondisplaced fracture of the proximal femur involving the subtrochanteric region (coronal 60/6). No evidence of loosening. The bones are osteopenic. No dislocation.  Ligaments Suboptimally assessed by CT. Muscles and Tendons Mild intramuscular edema. No fluid collection or hematoma. Soft tissues There is diffuse subcutaneous edema. No fluid collection hematoma. Aortoiliac atherosclerotic disease. IMPRESSION: 1. Nondisplaced subtrochanteric fracture of the right femur. No dislocation. 2. Total right hip arthroplasty. No evidence of loosening. 3. Aortic Atherosclerosis (ICD10-I70.0). Electronically Signed   By: Elgie Collard M.D.   On: 10/19/2020 21:40   DG Hip Unilat W or Wo Pelvis 2-3 Views Right  Result Date: 10/19/2020 CLINICAL DATA:  Radiograph 12/05/2018 EXAM: DG HIP (WITH OR WITHOUT PELVIS) 2-3V RIGHT COMPARISON:  Radiograph 12/05/2018 FINDINGS: Prior total right hip arthroplasty. Hardware remains in expected alignment however there is conspicuous cortical step-off towards the collared femoral stem which could reflect a periprosthetic hardware fracture. Additionally, cortical thickening along the medial proximal femoral diaphysis towards the terminus of the stem could reflect some stress reaction related change. More chronic, corticated fragmentation along the acetabular cup is unchanged from comparison. Overlying soft tissue swelling  of the right hip is present. No other acute traumatic findings in the bony pelvis. Left proximal femur is intact and normally located. Degenerative changes in the spine, hips and pelvis. Extensive calcified phleboliths. Mild edematous soft tissue changes more diffusely as well. IMPRESSION: Appearance concerning for a periprosthetic fracture involving the proximal portion of the collared femoral stem of a total right hip arthroplasty. Hardware remains otherwise intact and aligned. Stress reaction with cortical thickening along medial aspect of the proximal femoral diaphysis near the terminus of the femoral stem. Degenerative changes in the spine hips and pelvis. Diffuse bony demineralization. Electronically Signed   By: Kreg Shropshire M.D.    On: 10/19/2020 20:22    Procedures Procedures   Medications Ordered in ED Medications - No data to display  ED Course  I have reviewed the triage vital signs and the nursing notes.  Pertinent labs & imaging results that were available during my care of the patient were reviewed by me and considered in my medical decision making (see chart for details).    MDM Rules/Calculators/A&P                          MDM MSE complete  Kalayla NIKIYA STARN was evaluated in Emergency Department on 10/19/2020 for the symptoms described in the history of present illness. She was evaluated in the context of the global COVID-19 pandemic, which necessitated consideration that the patient might be at risk for infection with the SARS-CoV-2 virus that causes COVID-19. Institutional protocols and algorithms that pertain to the evaluation of patients at risk for COVID-19 are in a state of rapid change based on information released by regulatory bodies including the CDC and federal and state organizations. These policies and algorithms were followed during the patient's care in the ED.   Presents after fall at home.  She did strike her head.  She also complains of pain to the right hip after the fall.  Imaging reveals evidence of right hip fracture around pre-existing right total hip arthroplasty.  Dr. Susa Simmonds of orthopedics is aware of case.  Hospitalist service is aware of case and will evaluate for admission.   Final Clinical Impression(s) / ED Diagnoses Final diagnoses:  Fall, initial encounter  Closed fracture of right hip, initial encounter [facility])    Rx / DC Orders ED Discharge Orders     None        Wynetta Fines, MD 10/19/20 [id number]

## 2020-10-19 NOTE — ED Notes (Signed)
Attempted to call report to floor without success

## 2020-10-19 NOTE — ED Notes (Signed)
Patient transported to CT 

## 2020-10-20 DIAGNOSIS — Z8781 Personal history of (healed) traumatic fracture: Secondary | ICD-10-CM | POA: Diagnosis not present

## 2020-10-20 DIAGNOSIS — F0390 Unspecified dementia without behavioral disturbance: Secondary | ICD-10-CM

## 2020-10-20 DIAGNOSIS — S72001A Fracture of unspecified part of neck of right femur, initial encounter for closed fracture: Secondary | ICD-10-CM

## 2020-10-20 LAB — CBC
HCT: 31.8 % — ABNORMAL LOW (ref 36.0–46.0)
Hemoglobin: 10.9 g/dL — ABNORMAL LOW (ref 12.0–15.0)
MCH: 32.4 pg (ref 26.0–34.0)
MCHC: 34.3 g/dL (ref 30.0–36.0)
MCV: 94.6 fL (ref 80.0–100.0)
Platelets: 133 10*3/uL — ABNORMAL LOW (ref 150–400)
RBC: 3.36 MIL/uL — ABNORMAL LOW (ref 3.87–5.11)
RDW: 12.1 % (ref 11.5–15.5)
WBC: 6.9 10*3/uL (ref 4.0–10.5)
nRBC: 0 % (ref 0.0–0.2)

## 2020-10-20 LAB — BASIC METABOLIC PANEL
Anion gap: 7 (ref 5–15)
BUN: 21 mg/dL (ref 8–23)
CO2: 27 mmol/L (ref 22–32)
Calcium: 9.4 mg/dL (ref 8.9–10.3)
Chloride: 107 mmol/L (ref 98–111)
Creatinine, Ser: 1.04 mg/dL — ABNORMAL HIGH (ref 0.44–1.00)
GFR, Estimated: 51 mL/min — ABNORMAL LOW (ref >60)
Glucose, Bld: 119 mg/dL — ABNORMAL HIGH (ref 70–99)
Potassium: 3.9 mmol/L (ref 3.5–5.1)
Sodium: 141 mmol/L (ref 135–145)

## 2020-10-20 LAB — GLUCOSE, CAPILLARY
Glucose-Capillary: 85 mg/dL (ref 70–99)
Glucose-Capillary: 101 mg/dL — ABNORMAL HIGH (ref 70–99)
Glucose-Capillary: 122 mg/dL — ABNORMAL HIGH (ref 70–99)
Glucose-Capillary: 98 mg/dL (ref 70–99)

## 2020-10-20 LAB — PHOSPHORUS: Phosphorus: 4 mg/dL (ref 2.5–4.6)

## 2020-10-20 LAB — HEMOGLOBIN A1C
Hgb A1c MFr Bld: 6.2 % — ABNORMAL HIGH (ref 4.8–5.6)
Mean Plasma Glucose: 131.24 mg/dL

## 2020-10-20 LAB — MAGNESIUM: Magnesium: 1.9 mg/dL (ref 1.7–2.4)

## 2020-10-20 MED ORDER — DEXTROSE IN LACTATED RINGERS 5 % IV SOLN: INTRAVENOUS | Status: DC

## 2020-10-20 MED ORDER — DEXTROSE-NACL 2.5-0.45 % IV SOLN: INTRAVENOUS | Status: DC

## 2020-10-20 MED ORDER — DONEPEZIL HCL 5 MG PO TABS
5.0000 mg | ORAL_TABLET | Freq: Every day | ORAL | Status: DC
  Administered 2020-10-20 – 2020-10-28 (×9): 5 mg via ORAL
  Filled 2020-10-20 (×9): qty 1

## 2020-10-20 MED ORDER — APIXABAN 2.5 MG PO TABS
2.5000 mg | ORAL_TABLET | Freq: Two times a day (BID) | ORAL | Status: DC
  Administered 2020-10-20 – 2020-10-28 (×17): 2.5 mg via ORAL
  Filled 2020-10-20 (×17): qty 1

## 2020-10-20 NOTE — Consult Note (Signed)
Reason for Consult: Right periprosthetic hip fracture Referring Physician: [facility]  [name] is an 87 y.o. female.  HPI: Patient had a fall at home and was brought to the emergency department with continued right hip pain.  She has a history of a total hip arthroplasty which she states was performed in "1950".  Patient states after her fall she was able to weight-bear through the right lower extremity but had pain.  In the emergency department x-rays were concerning for periprosthetic fracture and CT scan demonstrated small area of cracking near the level of the intertrochanteric region.  The prosthesis appears stable.  Is a longstem prosthesis.  Patient is resting comfortably and denies pain elsewhere.  History reviewed. No pertinent past medical history.  History reviewed. No pertinent surgical history.  History reviewed. No pertinent family history.  Social History:  reports that she has never smoked. She has never used smokeless tobacco. She reports that she does not drink alcohol and does not use drugs.  Allergies: No Known Allergies  Medications: I have reviewed the patient's current medications.  Results for orders placed or performed during the hospital encounter of 10/19/20 (from the past 48 hour(s))  Basic metabolic panel     Status: Abnormal   Collection Time: 10/19/20  7:47 PM  Result Value Ref Range   Sodium 137 135 - 145 mmol/L   Potassium 3.9 3.5 - 5.1 mmol/L   Chloride 105 98 - 111 mmol/L   CO2 24 22 - 32 mmol/L   Glucose, Bld 139 (H) 70 - 99 mg/dL    Comment: Glucose reference range applies only to samples taken after fasting for at least 8 hours.   BUN 25 (H) 8 - 23 mg/dL   Creatinine, Ser 3.53 (H) 0.44 - 1.00 mg/dL   Calcium 9.2 8.9 - 29.9 mg/dL   GFR, Estimated 43 (L) >60 mL/min    Comment: (NOTE) Calculated using the CKD-EPI Creatinine Equation (2021)    Anion gap 8 5 - 15    Comment: Performed at [facility], [address] [place], [place] [address]  CBC with Differential     Status: Abnormal   Collection Time: 10/19/20  7:47 PM  Result Value Ref Range   WBC 4.7 4.0 - 10.5 K/uL   RBC 3.40 (L) 3.87 - 5.11 MIL/uL   Hemoglobin 10.9 (L) 12.0 - 15.0 g/dL   HCT 34.1 (L) 96.2 - 22.9 %   MCV 95.3 80.0 - 100.0 fL   MCH 32.1 26.0 - 34.0 pg   MCHC 33.6 30.0 - 36.0 g/dL   RDW 79.8 92.1 - 19.4 %   Platelets 142 (L) 150 - 400 K/uL   nRBC 0.0 0.0 - 0.2 %   Neutrophils Relative % 60 %   Neutro Abs 2.8 1.7 - 7.7 K/uL   Lymphocytes Relative 30 %   Lymphs Abs 1.4 0.7 - 4.0 K/uL   Monocytes Relative 9 %   Monocytes Absolute 0.4 0.1 - 1.0 K/uL   Eosinophils Relative 1 %   Eosinophils Absolute 0.1 0.0 - 0.5 K/uL   Basophils Relative 0 %   Basophils Absolute 0.0 0.0 - 0.1 K/uL   Immature Granulocytes 0 %   Abs Immature Granulocytes 0.02 0.00 - 0.07 K/uL    Comment: Performed at [facility], [address] [place], [place] [address]  Resp Panel by RT-PCR (Flu A&B, Covid) Nasopharyngeal Swab     Status: None   Collection Time:  10/19/20  9:59 PM   Specimen: Nasopharyngeal Swab; Nasopharyngeal(NP) swabs in vial transport medium  Result Value Ref Range   SARS Coronavirus 2 by RT PCR NEGATIVE NEGATIVE    Comment: (NOTE) SARS-CoV-2 target nucleic acids are NOT DETECTED.  The SARS-CoV-2 RNA is generally detectable in upper respiratory specimens during the acute phase of infection. The lowest concentration of SARS-CoV-2 viral copies this assay can detect is 138 copies/mL. A negative result does not preclude SARS-Cov-2 infection and should not be used as the sole basis for treatment or other patient management decisions. A negative result may occur with  improper specimen collection/handling, submission of specimen other than nasopharyngeal swab, presence of viral mutation(s) within the areas targeted by this assay, and inadequate number of viral copies(<138 copies/mL). A negative result must be combined  with clinical observations, patient history, and epidemiological information. The expected result is Negative.  Fact Sheet for Patients:  BloggerCourse.com  Fact Sheet for Healthcare Providers:  SeriousBroker.it  This test is no t yet approved or cleared by the [place] FDA and  has been authorized for detection and/or diagnosis of SARS-CoV-2 by FDA under an Emergency Use Authorization (EUA). This EUA will remain  in effect (meaning this test can be used) for the duration of the COVID-19 declaration under Section 564(b)(1) of the Act, 21 U.S.C.section 360bbb-3(b)(1), unless the authorization is terminated  or revoked sooner.       Influenza A by PCR NEGATIVE NEGATIVE   Influenza B by PCR NEGATIVE NEGATIVE    Comment: (NOTE) The Xpert Xpress SARS-CoV-2/FLU/RSV plus assay is intended as an aid in the diagnosis of influenza from Nasopharyngeal swab specimens and should not be used as a sole basis for treatment. Nasal washings and aspirates are unacceptable for Xpert Xpress SARS-CoV-2/FLU/RSV testing.  Fact Sheet for Patients: BloggerCourse.com  Fact Sheet for Healthcare Providers: SeriousBroker.it  This test is not yet approved or cleared by the [place] FDA and has been authorized for detection and/or diagnosis of SARS-CoV-2 by FDA under an Emergency Use Authorization (EUA). This EUA will remain in effect (meaning this test can be used) for the duration of the COVID-19 declaration under Section 564(b)(1) of the Act, 21 U.S.C. section 360bbb-3(b)(1), unless the authorization is terminated or revoked.  Performed at [facility], [address], [place], [place] [address]   Hemoglobin A1c     Status: Abnormal   Collection Time: 10/20/20 12:29 AM  Result Value Ref Range   Hgb A1c MFr Bld 6.2 (H) 4.8 - 5.6 %    Comment: (NOTE) Pre diabetes:           5.7%-6.4%  Diabetes:              >6.4%  Glycemic control for   <7.0% adults with diabetes    Mean Plasma Glucose 131.24 mg/dL    Comment: Performed at [facility], [address] [place], [place] [address]  CBC     Status: Abnormal   Collection Time: 10/20/20 12:29 AM  Result Value Ref Range   WBC 6.9 4.0 - 10.5 K/uL   RBC 3.36 (L) 3.87 - 5.11 MIL/uL   Hemoglobin 10.9 (L) 12.0 - 15.0 g/dL   HCT 96.2 (L) 83.6 - 62.9 %   MCV 94.6 80.0 - 100.0 fL   MCH 32.4 26.0 - 34.0 pg   MCHC 34.3 30.0 - 36.0 g/dL   RDW 47.6 54.6 - 50.3 %   Platelets 133 (L) 150 - 400 K/uL  Comment: REPEATED TO VERIFY   nRBC 0.0 0.0 - 0.2 %    Comment: Performed at [facility], [address] [place], [place] [address]  Basic metabolic panel     Status: Abnormal   Collection Time: 10/20/20 12:29 AM  Result Value Ref Range   Sodium 141 135 - 145 mmol/L   Potassium 3.9 3.5 - 5.1 mmol/L   Chloride 107 98 - 111 mmol/L   CO2 27 22 - 32 mmol/L   Glucose, Bld 119 (H) 70 - 99 mg/dL    Comment: Glucose reference range applies only to samples taken after fasting for at least 8 hours.   BUN 21 8 - 23 mg/dL   Creatinine, Ser 9.14 (H) 0.44 - 1.00 mg/dL   Calcium 9.4 8.9 - 78.2 mg/dL   GFR, Estimated 51 (L) >60 mL/min    Comment: (NOTE) Calculated using the CKD-EPI Creatinine Equation (2021)    Anion gap 7 5 - 15    Comment: Performed at [facility], [address] [place], [place] [address]  Magnesium     Status: None   Collection Time: 10/20/20 12:29 AM  Result Value Ref Range   Magnesium 1.9 1.7 - 2.4 mg/dL    Comment: Performed at [facility], [address], [place], [place] [address]  Phosphorus     Status: None   Collection Time: 10/20/20 12:29 AM  Result Value Ref Range   Phosphorus 4.0 2.5 - 4.6 mg/dL    Comment: Performed at [facility], [address], [place], [place] [address]  Glucose, capillary     Status: None   Collection Time: 10/20/20  6:38 AM  Result  Value Ref Range   Glucose-Capillary 85 70 - 99 mg/dL    Comment: Glucose reference range applies only to samples taken after fasting for at least 8 hours.    DG Chest 1 View  Result Date: 10/19/2020 CLINICAL DATA:  Fall EXAM: CHEST  1 VIEW COMPARISON:  Radiograph 06/24/2012 FINDINGS: No visible displaced rib fractures or other acute traumatic abnormality of the chest wall or included shoulders. The osseous structures appear diffusely demineralized which may limit detection of small or nondisplaced fractures. Degenerative changes are present in the imaged spine and shoulders. Low lung volumes with atelectatic changes. Some chronically coarsened interstitial features noted as well. Stable cardiomediastinal contours with a calcified, tortuous aorta. No visible pneumothorax or layering effusion Telemetry leads overlie the chest. IMPRESSION: No visible acute traumatic findings in the chest or included shoulders. Low volumes and atelectasis. Chronically coarsened interstitial changes. Aortic Atherosclerosis (ICD10-I70.0). Electronically Signed   By: Kreg Shropshire M.D.   On: 10/19/2020 20:20   CT Head Wo Contrast  Result Date: 10/19/2020 CLINICAL DATA:  87 year old female with head trauma. EXAM: CT HEAD WITHOUT CONTRAST CT CERVICAL SPINE WITHOUT CONTRAST TECHNIQUE: Multidetector CT imaging of the head and cervical spine was performed following the standard protocol without intravenous contrast. Multiplanar CT image reconstructions of the cervical spine were also generated. COMPARISON:  Head CT dated 01/05/2020. FINDINGS: Evaluation of this exam is limited due to motion artifact. CT HEAD FINDINGS Brain: Mild age-related atrophy and moderate chronic microvascular ischemic changes. There is no acute intracranial hemorrhage. No mass effect or midline shift. No extra-axial fluid collection. Vascular: No hyperdense vessel or unexpected calcification. Atherosclerotic calcification of the cavernous ICA. Skull: Normal.  Negative for fracture or focal lesion. Sinuses/Orbits: No acute finding. Other: None CT CERVICAL SPINE FINDINGS Alignment: No acute  subluxation. There is straightening of normal cervical lordosis which may be positional or due to muscle spasm. Skull base and vertebrae: No acute fracture.  Osteopenia. Soft tissues and spinal canal: No prevertebral fluid or swelling. No visible canal hematoma. Disc levels:  No acute findings.  Mild degenerative changes. Upper chest: Negative. Other: Advanced atherosclerotic calcification of the aortic arch as well as bilateral carotid bulb calcified plaques. There is heterogeneous appearance of the thyroid gland. IMPRESSION: 1. No acute intracranial pathology. 2. Age-related atrophy and chronic microvascular ischemic changes. 3. No acute/traumatic cervical spine pathology. Electronically Signed   By: Elgie Collard M.D.   On: 10/19/2020 20:56   CT Cervical Spine Wo Contrast  Result Date: 10/19/2020 CLINICAL DATA:  87 year old female with head trauma. EXAM: CT HEAD WITHOUT CONTRAST CT CERVICAL SPINE WITHOUT CONTRAST TECHNIQUE: Multidetector CT imaging of the head and cervical spine was performed following the standard protocol without intravenous contrast. Multiplanar CT image reconstructions of the cervical spine were also generated. COMPARISON:  Head CT dated 01/05/2020. FINDINGS: Evaluation of this exam is limited due to motion artifact. CT HEAD FINDINGS Brain: Mild age-related atrophy and moderate chronic microvascular ischemic changes. There is no acute intracranial hemorrhage. No mass effect or midline shift. No extra-axial fluid collection. Vascular: No hyperdense vessel or unexpected calcification. Atherosclerotic calcification of the cavernous ICA. Skull: Normal. Negative for fracture or focal lesion. Sinuses/Orbits: No acute finding. Other: None CT CERVICAL SPINE FINDINGS Alignment: No acute subluxation. There is straightening of normal cervical lordosis which may be  positional or due to muscle spasm. Skull base and vertebrae: No acute fracture.  Osteopenia. Soft tissues and spinal canal: No prevertebral fluid or swelling. No visible canal hematoma. Disc levels:  No acute findings.  Mild degenerative changes. Upper chest: Negative. Other: Advanced atherosclerotic calcification of the aortic arch as well as bilateral carotid bulb calcified plaques. There is heterogeneous appearance of the thyroid gland. IMPRESSION: 1. No acute intracranial pathology. 2. Age-related atrophy and chronic microvascular ischemic changes. 3. No acute/traumatic cervical spine pathology. Electronically Signed   By: Elgie Collard M.D.   On: 10/19/2020 20:56   CT Hip Right Wo Contrast  Result Date: 10/19/2020 CLINICAL DATA:  87 year old female with trauma to the right hip. EXAM: CT OF THE RIGHT HIP WITHOUT CONTRAST TECHNIQUE: Multidetector CT imaging of the right hip was performed according to the standard protocol. Multiplanar CT image reconstructions were also generated. COMPARISON:  Right hip radiograph dated 10/19/2020. FINDINGS: Evaluation is very limited due to streak artifact caused by right hip arthroplasty. Bones/Joint/Cartilage There is a total right hip arthroplasty. The arthroplasty components appear intact and in anatomic alignment. There is a nondisplaced fracture of the proximal femur involving the subtrochanteric region (coronal 60/6). No evidence of loosening. The bones are osteopenic. No dislocation. Ligaments Suboptimally assessed by CT. Muscles and Tendons Mild intramuscular edema. No fluid collection or hematoma. Soft tissues There is diffuse subcutaneous edema. No fluid collection hematoma. Aortoiliac atherosclerotic disease. IMPRESSION: 1. Nondisplaced subtrochanteric fracture of the right femur. No dislocation. 2. Total right hip arthroplasty. No evidence of loosening. 3. Aortic Atherosclerosis (ICD10-I70.0). Electronically Signed   By: Elgie Collard M.D.   On: 10/19/2020  21:40   DG Hip Unilat W or Wo Pelvis 2-3 Views Right  Result Date: 10/19/2020 CLINICAL DATA:  Radiograph 12/05/2018 EXAM: DG HIP (WITH OR WITHOUT PELVIS) 2-3V RIGHT COMPARISON:  Radiograph 12/05/2018 FINDINGS: Prior total right hip arthroplasty. Hardware remains in expected alignment however there is conspicuous cortical step-off towards the collared  femoral stem which could reflect a periprosthetic hardware fracture. Additionally, cortical thickening along the medial proximal femoral diaphysis towards the terminus of the stem could reflect some stress reaction related change. More chronic, corticated fragmentation along the acetabular cup is unchanged from comparison. Overlying soft tissue swelling of the right hip is present. No other acute traumatic findings in the bony pelvis. Left proximal femur is intact and normally located. Degenerative changes in the spine, hips and pelvis. Extensive calcified phleboliths. Mild edematous soft tissue changes more diffusely as well. IMPRESSION: Appearance concerning for a periprosthetic fracture involving the proximal portion of the collared femoral stem of a total right hip arthroplasty. Hardware remains otherwise intact and aligned. Stress reaction with cortical thickening along medial aspect of the proximal femoral diaphysis near the terminus of the femoral stem. Degenerative changes in the spine hips and pelvis. Diffuse bony demineralization. Electronically Signed   By: Kreg ShropshirePrice  DeHay M.D.   On: 10/19/2020 20:22    Review of Systems  Musculoskeletal:        Right hip pain  All other systems reviewed and are negative. Blood pressure (!) 173/73, pulse 84, temperature 99 F (37.2 C), temperature source Oral, resp. rate 16, height 5' 1.5" (1.562 m), weight 54.4 kg, SpO2 100 %. Physical Exam HENT:     Head: Normocephalic.     Mouth/Throat:     Mouth: Mucous membranes are moist.  Eyes:     Extraocular Movements: Extraocular movements intact.  Cardiovascular:      Rate and Rhythm: Normal rate.     Pulses: Normal pulses.  Pulmonary:     Effort: Pulmonary effort is normal.  Abdominal:     Palpations: Abdomen is soft.  Musculoskeletal:     Cervical back: Neck supple.     Comments: Bilateral lower extremities in normal resting position and appears symmetric length.  Right hip with some tenderness to palpation on the lateral aspect of the hip.  Mild discomfort with logroll.  No tenderness palpation distally about the thigh, knee, leg, ankle or foot.  No evidence of bilateral upper or left lower extremity injury.  Skin:    General: Skin is warm.  Neurological:     General: No focal deficit present.     Mental Status: She is alert.  Psychiatric:        Behavior: Behavior normal.    Assessment/Plan: Patient has a right periprosthetic hip fracture.  The implant appears stable.  She has cortical disruption likely muscular avulsion type injuries.  At this point she may weight-bear as tolerated through the right lower extremity and physical therapy may help with mobilization.  I will show her images to our total hip specialist tomorrow to ensure no need for revision surgery.  She is resting comfortably this morning.  No need to keep NPO.  We will plan to see the patient back in 4 weeks in the office to ensure that she is improving.  Terance HartChristopher R Verina Galeno 10/20/2020, 7:28 AM

## 2020-10-20 NOTE — Progress Notes (Signed)
PT glucose 89- is npo- no fluids infusing- on call for [facility]- notifed- pt assymptomatic

## 2020-10-20 NOTE — Progress Notes (Signed)
ANTICOAGULATION CONSULT NOTE - Initial Consult  Pharmacy Consult for apixaban Indication: atrial fibrillation  No Known Allergies  Patient Measurements: Height: 5' 1.5" (156.2 cm) Weight: 54.4 kg (119 lb 14.9 oz) IBW/kg (Calculated) : 48.95  Vital Signs: Temp: 97.6 F (36.4 C) (07/10 0747) Temp Source: Oral (07/10 0747) BP: 144/65 (07/10 0747) Pulse Rate: 65 (07/10 0747)  Labs: Recent Labs    10/19/20 1947 10/20/20 0029  HGB 10.9* 10.9*  HCT 32.4* 31.8*  PLT 142* 133*  CREATININE 1.20* 1.04*    Estimated Creatinine Clearance: 28.4 mL/min (A) (by C-G formula based on SCr of 1.04 mg/dL (H)).   Medical History: History reviewed. No pertinent past medical history.  Assessment: 66 YOF presented to ED d/t a fall with a right periprosthetic hip fracture. Patient was consulted to restart her PTA Eliquis 2.5 mg BID for Afib.   Goal of Therapy:  Monitor platelets by anticoagulation protocol: Yes   Plan:  - Apixaban 2.5 mg BID - CTM changes to renal clearance and weight for potential dose adjustments  March Rummage, PharmD PGY1 Pharmacy Resident  Please check AMION for all [facility] pharmacy phone numbers After 10:00 PM call main pharmacy [phone]

## 2020-10-20 NOTE — Progress Notes (Addendum)
PROGRESS NOTE  [name] GDJ:[id number] DOB: 04-21-1928 DOA: 10/19/2020 PCP: Nelwyn Salisbury, MD   LOS: 1 day   Brief Narrative / Interim history: 87 year old female with history of paroxysmal A. fib on Eliquis, mild dementia, right THA in 2005 who comes to the hospital after a fall at home.  She slipped on some water and fell on the kitchen floor.  She hit her head and landed on her right hip.  Work-up in the ED was concerning for right hip periprosthetic fracture and she was admitted to the hospital with orthopedic surgery consultation  Subjective / 24h Interval events: She is doing well this morning, denies any pain.  No chest discomfort, no shortness of breath.  No abdominal pain, no nausea or vomiting.  Assessment & Plan: Principal Problem Right periprosthetic hip fracture-orthopedic surgery consulted, appreciate input. -Seen by Dr. Susa Simmonds this morning, and it appears that the implant is stable and she has cortical destruction likely muscular avulsion type injuries.  This may not need surgery, he recommends weightbearing as tolerated, PT eval.  Active Problems Paroxysmal A. fib -Continue metoprolol.  Will discuss with Dr. Susa Simmonds if okay to resume Eliquis -She is in sinus rhythm right now  Mild dementia -Continue Aricept  CKD 3A -Creatinine last year 1.18, currently at baseline  Essential hypertension -continue metoprolol, blood pressure acceptable   Scheduled Meds:  donepezil  5 mg Oral Daily   insulin aspart  0-5 Units Subcutaneous QHS   insulin aspart  0-9 Units Subcutaneous TID WC   metoprolol tartrate  12.5 mg Oral BID   senna-docusate  2 tablet Oral QHS   Continuous Infusions:  dextrose 2.5 % and 0.45 % NaCl     PRN Meds:.acetaminophen, HYDROcodone-acetaminophen, melatonin, metoprolol tartrate, morphine injection, ondansetron (ZOFRAN) IV  Diet Orders (From admission, onward)     Start     Ordered   10/20/20 1017  Diet regular Room service appropriate? Yes;  Fluid consistency: Thin  Diet effective now       Question Answer Comment  Room service appropriate? Yes   Fluid consistency: Thin      10/20/20 1016            DVT prophylaxis: SCDs Start: 10/19/20 2248     Code Status: Full Code  Family Communication: No family at bedside  Status is: Inpatient  Remains inpatient appropriate because:Inpatient level of care appropriate due to severity of illness  Dispo:  Patient From: Home  Planned Disposition: Skilled Nursing Facility  Medically stable for discharge: No  Level of care: Telemetry Surgical  Consultants:  Orthopedic surgery  Procedures:  none  Microbiology  none  Antimicrobials: none    Objective: Vitals:   10/19/20 2245 10/19/20 2300 10/20/20 0001 10/20/20 0747  BP: (!) 173/75 (!) 156/66 (!) 173/73 (!) 144/65  Pulse: 82 83 84 65  Resp: 12 16 16 14   Temp:   99 F (37.2 C) 97.6 F (36.4 C)  TempSrc:   Oral Oral  SpO2: 99% 99% 100% 99%  Weight:      Height:        Intake/Output Summary (Last 24 hours) at 10/20/2020 1016 Last data filed at 10/19/2020 1930 Gross per 24 hour  Intake 0 ml  Output 0 ml  Net 0 ml   Filed Weights   10/19/20 1932 10/19/20 2000  Weight: 54.4 kg 54.4 kg    Examination:  Constitutional: NAD Eyes: no scleral icterus ENMT: Mucous membranes are moist.  Neck: normal, supple Respiratory: clear  to auscultation bilaterally, no wheezing, no crackles. Normal respiratory effort.  Cardiovascular: Regular rate and rhythm, no murmurs / rubs / gallops. No LE edema.  Abdomen: non distended, no tenderness. Bowel sounds positive.  Musculoskeletal: no clubbing / cyanosis.  Skin: no rashes Neurologic: Nonfocal   Data Reviewed: I have independently reviewed following labs and imaging studies   CBC: Recent Labs  Lab 10/19/20 1947 10/20/20 0029  WBC 4.7 6.9  NEUTROABS 2.8  --   HGB 10.9* 10.9*  HCT 32.4* 31.8*  MCV 95.3 94.6  PLT 142* 133*   Basic Metabolic Panel: Recent  Labs  Lab 10/19/20 1947 10/20/20 0029  NA 137 141  K 3.9 3.9  CL 105 107  CO2 24 27  GLUCOSE 139* 119*  BUN 25* 21  CREATININE 1.20* 1.04*  CALCIUM 9.2 9.4  MG  --  1.9  PHOS  --  4.0   Liver Function Tests: No results for input(s): AST, ALT, ALKPHOS, BILITOT, PROT, ALBUMIN in the last 168 hours. Coagulation Profile: No results for input(s): INR, PROTIME in the last 168 hours. HbA1C: Recent Labs    10/20/20 0029  HGBA1C 6.2*   CBG: Recent Labs  Lab 10/20/20 0638  GLUCAP 85    Recent Results (from the past 240 hour(s))  Resp Panel by RT-PCR (Flu A&B, Covid) Nasopharyngeal Swab     Status: None   Collection Time: 10/19/20  9:59 PM   Specimen: Nasopharyngeal Swab; Nasopharyngeal(NP) swabs in vial transport medium  Result Value Ref Range Status   SARS Coronavirus 2 by RT PCR NEGATIVE NEGATIVE Final    Comment: (NOTE) SARS-CoV-2 target nucleic acids are NOT DETECTED.  The SARS-CoV-2 RNA is generally detectable in upper respiratory specimens during the acute phase of infection. The lowest concentration of SARS-CoV-2 viral copies this assay can detect is 138 copies/mL. A negative result does not preclude SARS-Cov-2 infection and should not be used as the sole basis for treatment or other patient management decisions. A negative result may occur with  improper specimen collection/handling, submission of specimen other than nasopharyngeal swab, presence of viral mutation(s) within the areas targeted by this assay, and inadequate number of viral copies(<138 copies/mL). A negative result must be combined with clinical observations, patient history, and epidemiological information. The expected result is Negative.  Fact Sheet for Patients:  BloggerCourse.com  Fact Sheet for Healthcare Providers:  SeriousBroker.it  This test is no t yet approved or cleared by the [place] FDA and  has been authorized for detection  and/or diagnosis of SARS-CoV-2 by FDA under an Emergency Use Authorization (EUA). This EUA will remain  in effect (meaning this test can be used) for the duration of the COVID-19 declaration under Section 564(b)(1) of the Act, 21 U.S.C.section 360bbb-3(b)(1), unless the authorization is terminated  or revoked sooner.       Influenza A by PCR NEGATIVE NEGATIVE Final   Influenza B by PCR NEGATIVE NEGATIVE Final    Comment: (NOTE) The Xpert Xpress SARS-CoV-2/FLU/RSV plus assay is intended as an aid in the diagnosis of influenza from Nasopharyngeal swab specimens and should not be used as a sole basis for treatment. Nasal washings and aspirates are unacceptable for Xpert Xpress SARS-CoV-2/FLU/RSV testing.  Fact Sheet for Patients: BloggerCourse.com  Fact Sheet for Healthcare Providers: SeriousBroker.it  This test is not yet approved or cleared by the [place] FDA and has been authorized for detection and/or diagnosis of SARS-CoV-2 by FDA under an Emergency Use Authorization (EUA). This EUA will remain in  effect (meaning this test can be used) for the duration of the COVID-19 declaration under Section 564(b)(1) of the Act, 21 U.S.C. section 360bbb-3(b)(1), unless the authorization is terminated or revoked.  Performed at [facility], [address] [place], [place] [address]      Radiology Studies: DG Chest 1 View  Result Date: 10/19/2020 CLINICAL DATA:  Fall EXAM: CHEST  1 VIEW COMPARISON:  Radiograph 06/24/2012 FINDINGS: No visible displaced rib fractures or other acute traumatic abnormality of the chest wall or included shoulders. The osseous structures appear diffusely demineralized which may limit detection of small or nondisplaced fractures. Degenerative changes are present in the imaged spine and shoulders. Low lung volumes with atelectatic changes. Some chronically coarsened interstitial features noted as well.  Stable cardiomediastinal contours with a calcified, tortuous aorta. No visible pneumothorax or layering effusion Telemetry leads overlie the chest. IMPRESSION: No visible acute traumatic findings in the chest or included shoulders. Low volumes and atelectasis. Chronically coarsened interstitial changes. Aortic Atherosclerosis (ICD10-I70.0). Electronically Signed   By: Kreg ShropshirePrice  DeHay M.D.   On: 10/19/2020 20:20   CT Head Wo Contrast  Result Date: 10/19/2020 CLINICAL DATA:  87 year old female with head trauma. EXAM: CT HEAD WITHOUT CONTRAST CT CERVICAL SPINE WITHOUT CONTRAST TECHNIQUE: Multidetector CT imaging of the head and cervical spine was performed following the standard protocol without intravenous contrast. Multiplanar CT image reconstructions of the cervical spine were also generated. COMPARISON:  Head CT dated 01/05/2020. FINDINGS: Evaluation of this exam is limited due to motion artifact. CT HEAD FINDINGS Brain: Mild age-related atrophy and moderate chronic microvascular ischemic changes. There is no acute intracranial hemorrhage. No mass effect or midline shift. No extra-axial fluid collection. Vascular: No hyperdense vessel or unexpected calcification. Atherosclerotic calcification of the cavernous ICA. Skull: Normal. Negative for fracture or focal lesion. Sinuses/Orbits: No acute finding. Other: None CT CERVICAL SPINE FINDINGS Alignment: No acute subluxation. There is straightening of normal cervical lordosis which may be positional or due to muscle spasm. Skull base and vertebrae: No acute fracture.  Osteopenia. Soft tissues and spinal canal: No prevertebral fluid or swelling. No visible canal hematoma. Disc levels:  No acute findings.  Mild degenerative changes. Upper chest: Negative. Other: Advanced atherosclerotic calcification of the aortic arch as well as bilateral carotid bulb calcified plaques. There is heterogeneous appearance of the thyroid gland. IMPRESSION: 1. No acute intracranial  pathology. 2. Age-related atrophy and chronic microvascular ischemic changes. 3. No acute/traumatic cervical spine pathology. Electronically Signed   By: Elgie CollardArash  Radparvar M.D.   On: 10/19/2020 20:56   CT Cervical Spine Wo Contrast  Result Date: 10/19/2020 CLINICAL DATA:  87 year old female with head trauma. EXAM: CT HEAD WITHOUT CONTRAST CT CERVICAL SPINE WITHOUT CONTRAST TECHNIQUE: Multidetector CT imaging of the head and cervical spine was performed following the standard protocol without intravenous contrast. Multiplanar CT image reconstructions of the cervical spine were also generated. COMPARISON:  Head CT dated 01/05/2020. FINDINGS: Evaluation of this exam is limited due to motion artifact. CT HEAD FINDINGS Brain: Mild age-related atrophy and moderate chronic microvascular ischemic changes. There is no acute intracranial hemorrhage. No mass effect or midline shift. No extra-axial fluid collection. Vascular: No hyperdense vessel or unexpected calcification. Atherosclerotic calcification of the cavernous ICA. Skull: Normal. Negative for fracture or focal lesion. Sinuses/Orbits: No acute finding. Other: None CT CERVICAL SPINE FINDINGS Alignment: No acute subluxation. There is straightening of normal cervical lordosis which may be positional or due to muscle spasm. Skull base and vertebrae: No acute fracture.  Osteopenia. Soft tissues and spinal canal: No prevertebral fluid or swelling. No visible canal hematoma. Disc levels:  No acute findings.  Mild degenerative changes. Upper chest: Negative. Other: Advanced atherosclerotic calcification of the aortic arch as well as bilateral carotid bulb calcified plaques. There is heterogeneous appearance of the thyroid gland. IMPRESSION: 1. No acute intracranial pathology. 2. Age-related atrophy and chronic microvascular ischemic changes. 3. No acute/traumatic cervical spine pathology. Electronically Signed   By: Elgie Collard M.D.   On: 10/19/2020 20:56   CT Hip  Right Wo Contrast  Result Date: 10/19/2020 CLINICAL DATA:  87 year old female with trauma to the right hip. EXAM: CT OF THE RIGHT HIP WITHOUT CONTRAST TECHNIQUE: Multidetector CT imaging of the right hip was performed according to the standard protocol. Multiplanar CT image reconstructions were also generated. COMPARISON:  Right hip radiograph dated 10/19/2020. FINDINGS: Evaluation is very limited due to streak artifact caused by right hip arthroplasty. Bones/Joint/Cartilage There is a total right hip arthroplasty. The arthroplasty components appear intact and in anatomic alignment. There is a nondisplaced fracture of the proximal femur involving the subtrochanteric region (coronal 60/6). No evidence of loosening. The bones are osteopenic. No dislocation. Ligaments Suboptimally assessed by CT. Muscles and Tendons Mild intramuscular edema. No fluid collection or hematoma. Soft tissues There is diffuse subcutaneous edema. No fluid collection hematoma. Aortoiliac atherosclerotic disease. IMPRESSION: 1. Nondisplaced subtrochanteric fracture of the right femur. No dislocation. 2. Total right hip arthroplasty. No evidence of loosening. 3. Aortic Atherosclerosis (ICD10-I70.0). Electronically Signed   By: Elgie Collard M.D.   On: 10/19/2020 21:40   DG Hip Unilat W or Wo Pelvis 2-3 Views Right  Result Date: 10/19/2020 CLINICAL DATA:  Radiograph 12/05/2018 EXAM: DG HIP (WITH OR WITHOUT PELVIS) 2-3V RIGHT COMPARISON:  Radiograph 12/05/2018 FINDINGS: Prior total right hip arthroplasty. Hardware remains in expected alignment however there is conspicuous cortical step-off towards the collared femoral stem which could reflect a periprosthetic hardware fracture. Additionally, cortical thickening along the medial proximal femoral diaphysis towards the terminus of the stem could reflect some stress reaction related change. More chronic, corticated fragmentation along the acetabular cup is unchanged from comparison. Overlying  soft tissue swelling of the right hip is present. No other acute traumatic findings in the bony pelvis. Left proximal femur is intact and normally located. Degenerative changes in the spine, hips and pelvis. Extensive calcified phleboliths. Mild edematous soft tissue changes more diffusely as well. IMPRESSION: Appearance concerning for a periprosthetic fracture involving the proximal portion of the collared femoral stem of a total right hip arthroplasty. Hardware remains otherwise intact and aligned. Stress reaction with cortical thickening along medial aspect of the proximal femoral diaphysis near the terminus of the femoral stem. Degenerative changes in the spine hips and pelvis. Diffuse bony demineralization. Electronically Signed   By: Kreg Shropshire M.D.   On: 10/19/2020 20:22     Pamella Pert, MD, PhD [facility]  Between 7 am - 7 pm I am available, please contact me via Amion (for emergencies) or Securechat (non urgent messages)  Between 7 pm - 7 am I am not available, please contact night coverage MD/APP via Amion

## 2020-10-21 DIAGNOSIS — Z8781 Personal history of (healed) traumatic fracture: Secondary | ICD-10-CM | POA: Diagnosis not present

## 2020-10-21 DIAGNOSIS — S72001A Fracture of unspecified part of neck of right femur, initial encounter for closed fracture: Secondary | ICD-10-CM | POA: Diagnosis not present

## 2020-10-21 DIAGNOSIS — F0390 Unspecified dementia without behavioral disturbance: Secondary | ICD-10-CM | POA: Diagnosis not present

## 2020-10-21 LAB — CBC
HCT: 29.5 % — ABNORMAL LOW (ref 36.0–46.0)
Hemoglobin: 9.8 g/dL — ABNORMAL LOW (ref 12.0–15.0)
MCH: 31.8 pg (ref 26.0–34.0)
MCHC: 33.2 g/dL (ref 30.0–36.0)
MCV: 95.8 fL (ref 80.0–100.0)
Platelets: 111 10*3/uL — ABNORMAL LOW (ref 150–400)
RBC: 3.08 MIL/uL — ABNORMAL LOW (ref 3.87–5.11)
RDW: 12.1 % (ref 11.5–15.5)
WBC: 5.4 10*3/uL (ref 4.0–10.5)
nRBC: 0 % (ref 0.0–0.2)

## 2020-10-21 LAB — BASIC METABOLIC PANEL
Anion gap: 7 (ref 5–15)
BUN: 19 mg/dL (ref 8–23)
CO2: 26 mmol/L (ref 22–32)
Calcium: 8.8 mg/dL — ABNORMAL LOW (ref 8.9–10.3)
Chloride: 104 mmol/L (ref 98–111)
Creatinine, Ser: 0.91 mg/dL (ref 0.44–1.00)
GFR, Estimated: >60 mL/min (ref >60)
Glucose, Bld: 91 mg/dL (ref 70–99)
Potassium: 3.9 mmol/L (ref 3.5–5.1)
Sodium: 137 mmol/L (ref 135–145)

## 2020-10-21 LAB — GLUCOSE, CAPILLARY
Glucose-Capillary: 83 mg/dL (ref 70–99)
Glucose-Capillary: 187 mg/dL — ABNORMAL HIGH (ref 70–99)
Glucose-Capillary: 163 mg/dL — ABNORMAL HIGH (ref 70–99)
Glucose-Capillary: 139 mg/dL — ABNORMAL HIGH (ref 70–99)

## 2020-10-21 NOTE — Plan of Care (Signed)
  Problem: Health Behavior/Discharge Planning: Goal: Ability to manage health-related needs will improve Outcome: Progressing   Problem: Clinical Measurements: Goal: Ability to maintain clinical measurements within normal limits will improve Outcome: Progressing   Problem: Activity: Goal: Risk for activity intolerance will decrease Outcome: Progressing   Problem: Nutrition: Goal: Adequate nutrition will be maintained Outcome: Progressing   Problem: Coping: Goal: Level of anxiety will decrease Outcome: Progressing   Problem: Pain Managment: Goal: General experience of comfort will improve Outcome: Progressing   

## 2020-10-21 NOTE — Progress Notes (Signed)
Nutrition Brief Note  RD consulted as per hip/femur fracture protocol.  Admitting Dx: Fall [W19.XXXA] Fall, initial encounter [id number]] Closed fracture of right hip, initial encounter (HCC) [S72.001A] S/P right hip fracture [Z87.81] PMH: History reviewed. No pertinent past medical history.  Medications:  apixaban  2.5 mg Oral BID   donepezil  5 mg Oral Daily   insulin aspart  0-5 Units Subcutaneous QHS   insulin aspart  0-9 Units Subcutaneous TID WC   metoprolol tartrate  12.5 mg Oral BID   senna-docusate  2 tablet Oral QHS  Labs: Recent Labs  Lab 10/19/20 1947 10/20/20 0029 10/21/20 0142  NA 137 141 137  K 3.9 3.9 3.9  CL 105 107 104  CO2 24 27 26   BUN 25* 21 19  CREATININE 1.20* 1.04* 0.91  CALCIUM 9.2 9.4 8.8*  MG  --  1.9  --   PHOS  --  4.0  --   GLUCOSE 139* 119* 91   Weight history: Wt Readings from Last 15 Encounters:  10/19/20 54.4 kg  Pt reports no recent changes in weight history and none noted in EPIC  Body mass index is 22.29 kg/m. Patient meets criteria for normal based on current BMI.   Current diet order is regular, patient is consuming approximately 75% of meals at this time. Pt reports no issues with appetite/intake.   No nutrition interventions warranted at this time. If nutrition issues arise, please consult RD.   12/20/20, MS, RD, LDN (she/her/hers) RD pager number and weekend/on-call pager number located in Amion.

## 2020-10-21 NOTE — Progress Notes (Signed)
PROGRESS NOTE  [name] PZW:[id number] DOB: 1928/09/22 DOA: 10/19/2020 PCP: Nelwyn Salisbury, MD   LOS: 2 days   Brief Narrative / Interim history: 87 year old female with history of paroxysmal A. fib on Eliquis, mild dementia, right THA in 2005 who comes to the hospital after a fall at home.  She slipped on some water and fell on the kitchen floor.  She hit her head and landed on her right hip.  Work-up in the ED was concerning for right hip periprosthetic fracture and she was admitted to the hospital with orthopedic surgery consultation  Subjective / 24h Interval events: No complaints this morning, no significant pain.  No shortness of breath  Assessment & Plan: Principal Problem Right periprosthetic hip fracture-orthopedic surgery consulted, appreciate input. -Seen by Dr. Susa Simmonds on 7/10, and it appears that the implant is stable and she has cortical destruction likely muscular avulsion type injuries.  This may not need surgery, he recommends weightbearing as tolerated, PT eval. He was also to discussed with him on his partner whether revision is needed, awaiting follow-up impressions  Active Problems Paroxysmal A. fib -Continue metoprolol.  She was resumed on Eliquis on 7/10 after discussing with orthopedic surgery  Mild dementia -Continue Aricept  CKD 3A -Creatinine last year 1.18, currently at baseline  Essential hypertension -continue metoprolol, blood pressure acceptable   Scheduled Meds:  apixaban  2.5 mg Oral BID   donepezil  5 mg Oral Daily   insulin aspart  0-5 Units Subcutaneous QHS   insulin aspart  0-9 Units Subcutaneous TID WC   metoprolol tartrate  12.5 mg Oral BID   senna-docusate  2 tablet Oral QHS   Continuous Infusions:   PRN Meds:.acetaminophen, HYDROcodone-acetaminophen, melatonin, metoprolol tartrate, morphine injection, ondansetron (ZOFRAN) IV  Diet Orders (From admission, onward)     Start     Ordered   10/20/20 1017  Diet regular Room service  appropriate? Yes; Fluid consistency: Thin  Diet effective now       Question Answer Comment  Room service appropriate? Yes   Fluid consistency: Thin      10/20/20 1016            DVT prophylaxis: apixaban (ELIQUIS) tablet 2.5 mg Start: 10/20/20 1145 SCDs Start: 10/19/20 2248 apixaban (ELIQUIS) tablet 2.5 mg     Code Status: Full Code  Family Communication: No family at bedside  Status is: Inpatient  Remains inpatient appropriate because:Inpatient level of care appropriate due to severity of illness  Dispo:  Patient From: Home  Planned Disposition: Skilled Nursing Facility  Medically stable for discharge: No  Level of care: Telemetry Surgical  Consultants:  Orthopedic surgery  Procedures:  none  Microbiology  none  Antimicrobials: none    Objective: Vitals:   10/20/20 1454 10/20/20 2119 10/21/20 0449 10/21/20 0747  BP: (!) 116/48 (!) 156/72 (!) 163/75 (!) 161/66  Pulse: 64 62 61 (!) 101  Resp: 14 16 17 16   Temp: 98.7 F (37.1 C) 97.7 F (36.5 C) 98.6 F (37 C) 98.5 F (36.9 C)  TempSrc: Oral Oral Oral Oral  SpO2: 100% 98% 100% 100%  Weight:      Height:        Intake/Output Summary (Last 24 hours) at 10/21/2020 1352 Last data filed at 10/21/2020 0900 Gross per 24 hour  Intake 480 ml  Output 1400 ml  Net -920 ml    Filed Weights   10/19/20 1932 10/19/20 2000  Weight: 54.4 kg 54.4 kg    Examination:  Constitutional: She is in no distress Eyes: No scleral icterus ENMT: mmm Neck: normal, supple Respiratory: Clear bilaterally without wheezing, normal respiratory effort Cardiovascular: Regular rate and rhythm, no murmurs, no edema Abdomen: Soft, NT, ND, bowel sounds positive Musculoskeletal: no clubbing / cyanosis.  Skin: No rashes seen Neurologic: No focal deficits   Data Reviewed: I have independently reviewed following labs and imaging studies   CBC: Recent Labs  Lab 10/19/20 1947 10/20/20 0029 10/21/20 0142  WBC 4.7 6.9 5.4   NEUTROABS 2.8  --   --   HGB 10.9* 10.9* 9.8*  HCT 32.4* 31.8* 29.5*  MCV 95.3 94.6 95.8  PLT 142* 133* 111*    Basic Metabolic Panel: Recent Labs  Lab 10/19/20 1947 10/20/20 0029 10/21/20 0142  NA 137 141 137  K 3.9 3.9 3.9  CL 105 107 104  CO2 24 27 26   GLUCOSE 139* 119* 91  BUN 25* 21 19  CREATININE 1.20* 1.04* 0.91  CALCIUM 9.2 9.4 8.8*  MG  --  1.9  --   PHOS  --  4.0  --     Liver Function Tests: No results for input(s): AST, ALT, ALKPHOS, BILITOT, PROT, ALBUMIN in the last 168 hours. Coagulation Profile: No results for input(s): INR, PROTIME in the last 168 hours. HbA1C: Recent Labs    10/20/20 0029  HGBA1C 6.2*    CBG: Recent Labs  Lab 10/20/20 1143 10/20/20 1622 10/20/20 2028 10/21/20 0709 10/21/20 1152  GLUCAP 101* 122* 98 83 187*     Recent Results (from the past 240 hour(s))  Resp Panel by RT-PCR (Flu A&B, Covid) Nasopharyngeal Swab     Status: None   Collection Time: 10/19/20  9:59 PM   Specimen: Nasopharyngeal Swab; Nasopharyngeal(NP) swabs in vial transport medium  Result Value Ref Range Status   SARS Coronavirus 2 by RT PCR NEGATIVE NEGATIVE Final    Comment: (NOTE) SARS-CoV-2 target nucleic acids are NOT DETECTED.  The SARS-CoV-2 RNA is generally detectable in upper respiratory specimens during the acute phase of infection. The lowest concentration of SARS-CoV-2 viral copies this assay can detect is 138 copies/mL. A negative result does not preclude SARS-Cov-2 infection and should not be used as the sole basis for treatment or other patient management decisions. A negative result may occur with  improper specimen collection/handling, submission of specimen other than nasopharyngeal swab, presence of viral mutation(s) within the areas targeted by this assay, and inadequate number of viral copies(<138 copies/mL). A negative result must be combined with clinical observations, patient history, and epidemiological information. The  expected result is Negative.  Fact Sheet for Patients:  12/20/20  Fact Sheet for Healthcare Providers:  BloggerCourse.com  This test is no t yet approved or cleared by the [employer] FDA and  has been authorized for detection and/or diagnosis of SARS-CoV-2 by FDA under an Emergency Use Authorization (EUA). This EUA will remain  in effect (meaning this test can be used) for the duration of the COVID-19 declaration under Section 564(b)(1) of the Act, 21 U.S.C.section 360bbb-3(b)(1), unless the authorization is terminated  or revoked sooner.       Influenza A by PCR NEGATIVE NEGATIVE Final   Influenza B by PCR NEGATIVE NEGATIVE Final    Comment: (NOTE) The Xpert Xpress SARS-CoV-2/FLU/RSV plus assay is intended as an aid in the diagnosis of influenza from Nasopharyngeal swab specimens and should not be used as a sole basis for treatment. Nasal washings and aspirates are unacceptable for Xpert Xpress SARS-CoV-2/FLU/RSV testing.  Fact Sheet for Patients: BloggerCourse.com  Fact Sheet for Healthcare Providers: SeriousBroker.it  This test is not yet approved or cleared by the [place] FDA and has been authorized for detection and/or diagnosis of SARS-CoV-2 by FDA under an Emergency Use Authorization (EUA). This EUA will remain in effect (meaning this test can be used) for the duration of the COVID-19 declaration under Section 564(b)(1) of the Act, 21 U.S.C. section 360bbb-3(b)(1), unless the authorization is terminated or revoked.  Performed at [facility], [address] [place], [place] [address]       Radiology Studies: No results found.   Pamella Pert, MD, PhD [facility]  Between 7 am - 7 pm I am available, please contact me via Amion (for emergencies) or Securechat (non urgent messages)  Between 7 pm - 7 am I am not available, please  contact night coverage MD/APP via Amion

## 2020-10-21 NOTE — TOC CAGE-AID Note (Signed)
Transition of Care [facility]) - CAGE-AID Screening   Patient Details  Name: RAECHEL MARCOS MRN: [id number] Date of Birth: 03/21/1929  Transition of Care [facility]) CM/SW Contact:    Kalyiah Saintil C Tarpley-Carter, LCSWA Phone Number: 10/21/2020, 9:06 AM   Clinical Narrative: Pt is unable to participate in Cage Aid. Pt has Dementia.  Mateja Dier Tarpley-Carter, MSW, LCSW-A Pronouns:  She/Her/Hers                          [place] Clinical Social WorkerTransitions of Care Cell:  [phone] [email or web address]    CAGE-AID Screening: Substance Abuse Screening unable to be completed due to: : Patient unable to participate (Dementia)

## 2020-10-22 DIAGNOSIS — F0390 Unspecified dementia without behavioral disturbance: Secondary | ICD-10-CM | POA: Diagnosis not present

## 2020-10-22 DIAGNOSIS — S72001A Fracture of unspecified part of neck of right femur, initial encounter for closed fracture: Secondary | ICD-10-CM | POA: Diagnosis not present

## 2020-10-22 DIAGNOSIS — Z8781 Personal history of (healed) traumatic fracture: Secondary | ICD-10-CM | POA: Diagnosis not present

## 2020-10-22 LAB — GLUCOSE, CAPILLARY
Glucose-Capillary: 106 mg/dL — ABNORMAL HIGH (ref 70–99)
Glucose-Capillary: 119 mg/dL — ABNORMAL HIGH (ref 70–99)
Glucose-Capillary: 136 mg/dL — ABNORMAL HIGH (ref 70–99)
Glucose-Capillary: 190 mg/dL — ABNORMAL HIGH (ref 70–99)
Glucose-Capillary: 79 mg/dL (ref 70–99)

## 2020-10-22 LAB — CBC
HCT: 35.6 % — ABNORMAL LOW (ref 36.0–46.0)
Hemoglobin: 12 g/dL (ref 12.0–15.0)
MCH: 31.4 pg (ref 26.0–34.0)
MCHC: 33.7 g/dL (ref 30.0–36.0)
MCV: 93.2 fL (ref 80.0–100.0)
Platelets: 104 10*3/uL — ABNORMAL LOW (ref 150–400)
RBC: 3.82 MIL/uL — ABNORMAL LOW (ref 3.87–5.11)
RDW: 11.9 % (ref 11.5–15.5)
WBC: 5.4 10*3/uL (ref 4.0–10.5)
nRBC: 0 % (ref 0.0–0.2)

## 2020-10-22 LAB — BASIC METABOLIC PANEL
Anion gap: 8 (ref 5–15)
BUN: 14 mg/dL (ref 8–23)
CO2: 25 mmol/L (ref 22–32)
Calcium: 8.7 mg/dL — ABNORMAL LOW (ref 8.9–10.3)
Chloride: 105 mmol/L (ref 98–111)
Creatinine, Ser: 0.95 mg/dL (ref 0.44–1.00)
GFR, Estimated: 57 mL/min — ABNORMAL LOW (ref >60)
Glucose, Bld: 120 mg/dL — ABNORMAL HIGH (ref 70–99)
Potassium: 3.7 mmol/L (ref 3.5–5.1)
Sodium: 138 mmol/L (ref 135–145)

## 2020-10-22 NOTE — Progress Notes (Signed)
PT Cancellation Note  Patient Details Name: [name] MRN: [id number] DOB: March 25, 1929   Cancelled Treatment:    Reason Eval/Treat Not Completed: Other (comment).  Finishing meal and will return at another time.   Ivar Drape 10/22/2020, 9:38 AM  Samul Dada, PT MS Acute Rehab Dept. Number: [facility] [id number] and [facility] [phone]

## 2020-10-22 NOTE — TOC Progression Note (Signed)
Transition of Care [facility]) - Progression Note    Patient Details  Name: [name] MRN: [id number] Date of Birth: [birth year]  Transition of Care [facility]) CM/SW Contact  Ralene Bathe, LCSWA Phone Number: 10/22/2020, 3:33 PM  Clinical Narrative:     TOC following patient for any d/c planning needs once medically stable.  Pending PT assessment and recommendations  Cleon Gustin, MSW, LCSWA        Expected Discharge Plan and Services                                                 Social Determinants of Health (SDOH) Interventions    Readmission Risk Interventions No flowsheet data found.

## 2020-10-22 NOTE — Progress Notes (Addendum)
PROGRESS NOTE  [name] VZD:[id number] DOB: 20-Nov-1928 DOA: 10/19/2020 PCP: Nelwyn Salisbury, MD   LOS: 3 days   Brief Narrative / Interim history: 87 year old female with history of paroxysmal A. fib on Eliquis, mild dementia, right THA in 2005 who comes to the hospital after a fall at home.  She slipped on some water and fell on the kitchen floor.  She hit her head and landed on her right hip.  Work-up in the ED was concerning for right hip periprosthetic fracture and she was admitted to the hospital with orthopedic surgery consultation  Subjective / 24h Interval events: In bed, no complaints.  Eating breakfast  Assessment & Plan: Principal Problem Right periprosthetic hip fracture-orthopedic surgery consulted, appreciate input. Seen by Dr. Susa Simmonds on 7/10, and it appears that the implant is stable and she has cortical destruction likely muscular avulsion type injuries. -He recommends nonoperative treatment at this point, weightbearing as tolerated, PT evaluation pending  Active Problems Paroxysmal A. fib -Continue metoprolol, she was resumed on Eliquis on 7/10 after discussing with orthopedic surgery  Mild dementia -Continue Aricept  CKD 3A -Creatinine last year 1.18, currently at baseline  Essential hypertension -continue metoprolol, blood pressure acceptable  Scheduled Meds:  apixaban  2.5 mg Oral BID   donepezil  5 mg Oral Daily   insulin aspart  0-5 Units Subcutaneous QHS   insulin aspart  0-9 Units Subcutaneous TID WC   metoprolol tartrate  12.5 mg Oral BID   senna-docusate  2 tablet Oral QHS   Continuous Infusions:   PRN Meds:.acetaminophen, HYDROcodone-acetaminophen, melatonin, metoprolol tartrate, morphine injection, ondansetron (ZOFRAN) IV  Diet Orders (From admission, onward)     Start     Ordered   10/20/20 1017  Diet regular Room service appropriate? Yes; Fluid consistency: Thin  Diet effective now       Question Answer Comment  Room service  appropriate? Yes   Fluid consistency: Thin      10/20/20 1016            DVT prophylaxis: apixaban (ELIQUIS) tablet 2.5 mg Start: 10/20/20 1145 SCDs Start: 10/19/20 2248 apixaban (ELIQUIS) tablet 2.5 mg     Code Status: Full Code  Family Communication: No family at bedside  Status is: Inpatient  Remains inpatient appropriate because:Inpatient level of care appropriate due to severity of illness  Dispo:  Patient From: Home  Planned Disposition: Skilled Nursing Facility  Medically stable for discharge: No  Level of care: Telemetry Surgical  Consultants:  Orthopedic surgery  Procedures:  none  Microbiology  none  Antimicrobials: none    Objective: Vitals:   10/21/20 0747 10/21/20 1519 10/21/20 2149 10/22/20 0802  BP: (!) 161/66 (!) 144/67 130/61 (!) 152/62  Pulse: (!) 101 (!) 57 67 (!) 55  Resp: 16 15 16 17   Temp: 98.5 F (36.9 C) 98.3 F (36.8 C) 97.8 F (36.6 C) 98.7 F (37.1 C)  TempSrc: Oral Oral Oral Oral  SpO2: 100% (!) 87% 100% 94%  Weight:      Height:        Intake/Output Summary (Last 24 hours) at 10/22/2020 1049 Last data filed at 10/22/2020 0603 Gross per 24 hour  Intake 480 ml  Output 750 ml  Net -270 ml    Filed Weights   10/19/20 1932 10/19/20 2000  Weight: 54.4 kg 54.4 kg    Examination:  Constitutional: NAD, in bed Eyes: No scleral icterus ENMT: mmm Neck: normal, supple Respiratory: Lungs are clear bilaterally, no wheezing, normal  respiratory effort Cardiovascular: Regular rate and rhythm, no murmurs, no peripheral edema Abdomen: Abdomen is soft, nontender, bowel sounds positive Musculoskeletal: no clubbing / cyanosis.  Skin: No rashes seen Neurologic: Nonfocal, equal strength   Data Reviewed: I have independently reviewed following labs and imaging studies   CBC: Recent Labs  Lab 10/19/20 1947 10/20/20 0029 10/21/20 0142  WBC 4.7 6.9 5.4  NEUTROABS 2.8  --   --   HGB 10.9* 10.9* 9.8*  HCT 32.4* 31.8* 29.5*   MCV 95.3 94.6 95.8  PLT 142* 133* 111*    Basic Metabolic Panel: Recent Labs  Lab 10/19/20 1947 10/20/20 0029 10/21/20 0142 10/22/20 0706  NA 137 141 137 138  K 3.9 3.9 3.9 3.7  CL 105 107 104 105  CO2 24 27 26 25   GLUCOSE 139* 119* 91 120*  BUN 25* 21 19 14   CREATININE 1.20* 1.04* 0.91 0.95  CALCIUM 9.2 9.4 8.8* 8.7*  MG  --  1.9  --   --   PHOS  --  4.0  --   --     Liver Function Tests: No results for input(s): AST, ALT, ALKPHOS, BILITOT, PROT, ALBUMIN in the last 168 hours. Coagulation Profile: No results for input(s): INR, PROTIME in the last 168 hours. HbA1C: Recent Labs    10/20/20 0029  HGBA1C 6.2*    CBG: Recent Labs  Lab 10/21/20 1152 10/21/20 1627 10/21/20 2144 10/22/20 0604 10/22/20 0751  GLUCAP 187* 163* 139* 106* 119*     Recent Results (from the past 240 hour(s))  Resp Panel by RT-PCR (Flu A&B, Covid) Nasopharyngeal Swab     Status: None   Collection Time: 10/19/20  9:59 PM   Specimen: Nasopharyngeal Swab; Nasopharyngeal(NP) swabs in vial transport medium  Result Value Ref Range Status   SARS Coronavirus 2 by RT PCR NEGATIVE NEGATIVE Final    Comment: (NOTE) SARS-CoV-2 target nucleic acids are NOT DETECTED.  The SARS-CoV-2 RNA is generally detectable in upper respiratory specimens during the acute phase of infection. The lowest concentration of SARS-CoV-2 viral copies this assay can detect is 138 copies/mL. A negative result does not preclude SARS-Cov-2 infection and should not be used as the sole basis for treatment or other patient management decisions. A negative result may occur with  improper specimen collection/handling, submission of specimen other than nasopharyngeal swab, presence of viral mutation(s) within the areas targeted by this assay, and inadequate number of viral copies(<138 copies/mL). A negative result must be combined with clinical observations, patient history, and epidemiological information. The expected result  is Negative.  Fact Sheet for Patients:  12/23/20  Fact Sheet for Healthcare Providers:  12/20/20  This test is no t yet approved or cleared by the [employer] FDA and  has been authorized for detection and/or diagnosis of SARS-CoV-2 by FDA under an Emergency Use Authorization (EUA). This EUA will remain  in effect (meaning this test can be used) for the duration of the COVID-19 declaration under Section 564(b)(1) of the Act, 21 U.S.C.section 360bbb-3(b)(1), unless the authorization is terminated  or revoked sooner.       Influenza A by PCR NEGATIVE NEGATIVE Final   Influenza B by PCR NEGATIVE NEGATIVE Final    Comment: (NOTE) The Xpert Xpress SARS-CoV-2/FLU/RSV plus assay is intended as an aid in the diagnosis of influenza from Nasopharyngeal swab specimens and should not be used as a sole basis for treatment. Nasal washings and aspirates are unacceptable for Xpert Xpress SARS-CoV-2/FLU/RSV testing.  Fact Sheet for  Patients: BloggerCourse.com  Fact Sheet for Healthcare Providers: SeriousBroker.it  This test is not yet approved or cleared by the [place] FDA and has been authorized for detection and/or diagnosis of SARS-CoV-2 by FDA under an Emergency Use Authorization (EUA). This EUA will remain in effect (meaning this test can be used) for the duration of the COVID-19 declaration under Section 564(b)(1) of the Act, 21 U.S.C. section 360bbb-3(b)(1), unless the authorization is terminated or revoked.  Performed at [facility], [address] [place], [place] [address]       Radiology Studies: No results found.   Pamella Pert, MD, PhD [facility]  Between 7 am - 7 pm I am available, please contact me via Amion (for emergencies) or Securechat (non urgent messages)  Between 7 pm - 7 am I am not available, please contact night  coverage MD/APP via Amion

## 2020-10-22 NOTE — Care Management Important Message (Signed)
Important Message  Patient Details  Name: [name] MRN: [id number] Date of Birth: [birth year]   Medicare Important Message Given:  Yes     [name] P [name] 10/22/2020, 1:42 PM

## 2020-10-22 NOTE — Evaluation (Signed)
Physical Therapy Evaluation Patient Details Name: [name] MRN: [id number] DOB: 09/29/1928 Today's Date: 10/22/2020   History of Present Illness  87 yo female with onset of fall at home slipping in water on kitchen floor was admitted and found to have R hip periprosthetic fracture, determined non surgical, WBAT.  PMHx:  PAF, dementia, CKD 3a, HTN,  Clinical Impression  Pt was assessed for mobility on RW with help to stand and pivot step to the chair.  Pt is moderately confused and cannot share all her history, so will need further information about the set up at home to know when she is ready to transition home.  Follow pt for work on her standing balance and control of steps with walker, as well as strength on BLE's as tolerated given her discomfort.  Encourage OOB in chair and instruct nursing as needed to assist her.    Follow Up Recommendations SNF    Equipment Recommendations  None recommended by PT    Recommendations for Other Services       Precautions / Restrictions Precautions Precautions: Fall Precaution Comments: no history to indicate if hip previously had precautions Restrictions Weight Bearing Restrictions: Yes RLE Weight Bearing: Weight bearing as tolerated      Mobility  Bed Mobility Overal bed mobility: Needs Assistance Bed Mobility: Supine to Sit     Supine to sit: Mod assist     General bed mobility comments: mod assist to scoot out on bedside    Transfers Overall transfer level: Needs assistance Equipment used: Rolling walker (2 wheeled);1 person hand held assist Transfers: Sit to/from Stand Sit to Stand: Mod assist         General transfer comment: mod assist to stand and pt was barely able to sidestep to chair  Ambulation/Gait             General Gait Details: steps were during transfers  Stairs            Wheelchair Mobility    Modified Rankin (Stroke Patients Only)       Balance Overall balance assessment:  History of Falls;Needs assistance Sitting-balance support: Feet supported Sitting balance-Leahy Scale: Fair     Standing balance support: Bilateral upper extremity supported;During functional activity Standing balance-Leahy Scale: Poor                               Pertinent Vitals/Pain Pain Assessment: Faces Faces Pain Scale: Hurts little more Pain Location: R hip with mobility Pain Descriptors / Indicators: Guarding Pain Intervention(s): Limited activity within patient's tolerance;Monitored during session;Premedicated before session;Repositioned    Home Living Family/patient expects to be discharged to:: Skilled nursing facility Living Arrangements: Children Available Help at Discharge: Family;Available 24 hours/day Type of Home: House           Additional Comments: pt is unable to give a reliable history    Prior Function Level of Independence: Needs assistance   Gait / Transfers Assistance Needed: Pt unable to give a history  ADL's / Homemaking Assistance Needed: lived with children        Hand Dominance   Dominant Hand: Right    Extremity/Trunk Assessment   Upper Extremity Assessment Upper Extremity Assessment: Generalized weakness    Lower Extremity Assessment Lower Extremity Assessment: Generalized weakness;RLE deficits/detail RLE Deficits / Details: R hip has new fracture on hip prosthesis, requires help to move it RLE Coordination: decreased gross motor    Cervical /  Trunk Assessment Cervical / Trunk Assessment: Kyphotic  Communication   Communication: No difficulties  Cognition Arousal/Alertness: Awake/alert Behavior During Therapy: WFL for tasks assessed/performed Overall Cognitive Status: History of cognitive impairments - at baseline                                        General Comments General comments (skin integrity, edema, etc.): Pt is limited by her weakness in LE's and new injury but not necessariy  pain    Exercises     Assessment/Plan    PT Assessment Patient needs continued PT services  PT Problem List Decreased strength;Decreased activity tolerance;Decreased balance;Decreased mobility;Decreased coordination;Decreased knowledge of use of DME;Decreased safety awareness;Decreased cognition;Decreased skin integrity;Pain       PT Treatment Interventions DME instruction;Functional mobility training;Gait training;Therapeutic activities;Therapeutic exercise;Balance training;Patient/family education;Neuromuscular re-education    PT Goals (Current goals can be found in the Care Plan section)  Acute Rehab PT Goals Patient Stated Goal: none stated PT Goal Formulation: Patient unable to participate in goal setting Time For Goal Achievement: 11/05/20 Potential to Achieve Goals: Good    Frequency Min 3X/week   Barriers to discharge  (unclear what the setup and assistance for home will be) unknown    Co-evaluation               AM-PAC PT "6 Clicks" Mobility  Outcome Measure Help needed turning from your back to your side while in a flat bed without using bedrails?: A Little Help needed moving from lying on your back to sitting on the side of a flat bed without using bedrails?: A Lot Help needed moving to and from a bed to a chair (including a wheelchair)?: A Lot Help needed standing up from a chair using your arms (e.g., wheelchair or bedside chair)?: A Lot Help needed to walk in hospital room?: A Lot Help needed climbing 3-5 steps with a railing? : Total 6 Click Score: 12    End of Session Equipment Utilized During Treatment: Gait belt;Oxygen Activity Tolerance: Patient limited by fatigue;Treatment limited secondary to medical complications (Comment) Patient left: in chair;with call bell/phone within reach;with chair alarm set Nurse Communication: Mobility status PT Visit Diagnosis: Unsteadiness on feet (R26.81);Muscle weakness (generalized) (M62.81);History of falling  (Z91.81);Pain Pain - Right/Left: Right Pain - part of body: Hip    Time: 4818-5631 PT Time Calculation (min) (ACUTE ONLY): 31 min   Charges:   PT Evaluation $PT Eval Moderate Complexity: 1 Mod PT Treatments $Therapeutic Activity: 8-22 mins       Ivar Drape 10/22/2020, 3:41 PM  Samul Dada, PT MS Acute Rehab Dept. Number: [facility] [id number] and [facility] [phone]

## 2020-10-23 DIAGNOSIS — Z8781 Personal history of (healed) traumatic fracture: Secondary | ICD-10-CM | POA: Diagnosis not present

## 2020-10-23 DIAGNOSIS — I4891 Unspecified atrial fibrillation: Secondary | ICD-10-CM

## 2020-10-23 DIAGNOSIS — I10 Essential (primary) hypertension: Secondary | ICD-10-CM | POA: Diagnosis not present

## 2020-10-23 DIAGNOSIS — F0390 Unspecified dementia without behavioral disturbance: Secondary | ICD-10-CM | POA: Diagnosis not present

## 2020-10-23 LAB — CBC
HCT: 31.1 % — ABNORMAL LOW (ref 36.0–46.0)
Hemoglobin: 10.3 g/dL — ABNORMAL LOW (ref 12.0–15.0)
MCH: 31.5 pg (ref 26.0–34.0)
MCHC: 33.1 g/dL (ref 30.0–36.0)
MCV: 95.1 fL (ref 80.0–100.0)
Platelets: 126 10*3/uL — ABNORMAL LOW (ref 150–400)
RBC: 3.27 MIL/uL — ABNORMAL LOW (ref 3.87–5.11)
RDW: 12.2 % (ref 11.5–15.5)
WBC: 4.6 10*3/uL (ref 4.0–10.5)
nRBC: 0 % (ref 0.0–0.2)

## 2020-10-23 LAB — BASIC METABOLIC PANEL
Anion gap: 6 (ref 5–15)
BUN: 19 mg/dL (ref 8–23)
CO2: 25 mmol/L (ref 22–32)
Calcium: 8.8 mg/dL — ABNORMAL LOW (ref 8.9–10.3)
Chloride: 106 mmol/L (ref 98–111)
Creatinine, Ser: 1.03 mg/dL — ABNORMAL HIGH (ref 0.44–1.00)
GFR, Estimated: 52 mL/min — ABNORMAL LOW (ref >60)
Glucose, Bld: 122 mg/dL — ABNORMAL HIGH (ref 70–99)
Potassium: 4.1 mmol/L (ref 3.5–5.1)
Sodium: 137 mmol/L (ref 135–145)

## 2020-10-23 LAB — GLUCOSE, CAPILLARY
Glucose-Capillary: 100 mg/dL — ABNORMAL HIGH (ref 70–99)
Glucose-Capillary: 116 mg/dL — ABNORMAL HIGH (ref 70–99)
Glucose-Capillary: 121 mg/dL — ABNORMAL HIGH (ref 70–99)
Glucose-Capillary: 157 mg/dL — ABNORMAL HIGH (ref 70–99)
Glucose-Capillary: 159 mg/dL — ABNORMAL HIGH (ref 70–99)
Glucose-Capillary: 162 mg/dL — ABNORMAL HIGH (ref 70–99)
Glucose-Capillary: 69 mg/dL — ABNORMAL LOW (ref 70–99)
Glucose-Capillary: 90 mg/dL (ref 70–99)

## 2020-10-23 NOTE — Progress Notes (Signed)
Inpatient Rehabilitation Admissions Coordinator   I met at bedside with patient and her spouse per request TOC. They had previously denied SNF placement. I explained that she does not have the medical need for a hospital based rehab program nor would [facility] Medicare approve CIR for this diagnosis. I recommended SNF and they are in agreement. I have alerted acute team and TOC. We will not pursue CIR at this time. Spouse requesting I call his sister to assist with SNF placement, but he does not have her number available.  Danne Baxter, RN, MSN Rehab Admissions Coordinator [phone] 10/23/2020 2:17 PM

## 2020-10-23 NOTE — Progress Notes (Signed)
I discussed her x-rays with our total joint surgeon Dr. Turner Daniels and he agrees with nonoperative treatment and fully weightbearing as tolerated with physical therapy.  She will follow up with me in 1 month for recheck.

## 2020-10-23 NOTE — Progress Notes (Signed)
PROGRESS NOTE    [name]  KPT:[id number] DOB: 1928-12-29 DOA: 10/19/2020 PCP: Nelwyn Salisbury, MD    Brief Narrative:  87 year old female with history of paroxysmal A. fib on Eliquis, mild dementia, right THA in 2005 who comes to the hospital after a fall at home.  She slipped on some water and fell on the kitchen floor.  She hit her head and landed on her right hip.  Work-up in the ED was concerning for right hip periprosthetic fracture and she was admitted to the hospital with orthopedic surgery consultation.  7/13 no overnight issues.  Not much pain reported in the right hip   Consultants:    Procedures:   Antimicrobials:      Subjective: No sob or cp.   Objective: Vitals:   10/21/20 2149 10/22/20 0802 10/22/20 2234 10/23/20 0815  BP: 130/61 (!) 152/62 (!) 103/35 139/63  Pulse: 67 (!) 55 60 71  Resp: 16 17 16 17   Temp: 97.8 F (36.6 C) 98.7 F (37.1 C) 98.4 F (36.9 C) 97.9 F (36.6 C)  TempSrc: Oral Oral Oral Oral  SpO2: 100% 94%  100%  Weight:      Height:        Intake/Output Summary (Last 24 hours) at 10/23/2020 0841 Last data filed at 10/22/2020 1700 Gross per 24 hour  Intake 840 ml  Output --  Net 840 ml   Filed Weights   10/19/20 1932 10/19/20 2000  Weight: 54.4 kg 54.4 kg    Examination:  General exam: Appears calm and comfortable  Respiratory system: Clear to auscultation. Respiratory effort normal. Cardiovascular system: S1 & S2 heard, RRR. No gallops  Gastrointestinal system: Abdomen is nondistended, soft and nontender.  Normal bowel sounds heard. Central nervous system: Alert and alert grossly intact Extremities: Warm dry Psychiatry: Mood & affect appropriate.     Data Reviewed: I have personally reviewed following labs and imaging studies  CBC: Recent Labs  Lab 10/19/20 1947 10/20/20 0029 10/21/20 0142 10/22/20 0706 10/23/20 0416  WBC 4.7 6.9 5.4 5.4 4.6  NEUTROABS 2.8  --   --   --   --   HGB 10.9* 10.9* 9.8* 12.0  10.3*  HCT 32.4* 31.8* 29.5* 35.6* 31.1*  MCV 95.3 94.6 95.8 93.2 95.1  PLT 142* 133* 111* 104* 126*   Basic Metabolic Panel: Recent Labs  Lab 10/19/20 1947 10/20/20 0029 10/21/20 0142 10/22/20 0706 10/23/20 0416  NA 137 141 137 138 137  K 3.9 3.9 3.9 3.7 4.1  CL 105 107 104 105 106  CO2 24 27 26 25 25   GLUCOSE 139* 119* 91 120* 122*  BUN 25* 21 19 14 19   CREATININE 1.20* 1.04* 0.91 0.95 1.03*  CALCIUM 9.2 9.4 8.8* 8.7* 8.8*  MG  --  1.9  --   --   --   PHOS  --  4.0  --   --   --    GFR: Estimated Creatinine Clearance: 28.6 mL/min (A) (by C-G formula based on SCr of 1.03 mg/dL (H)). Liver Function Tests: No results for input(s): AST, ALT, ALKPHOS, BILITOT, PROT, ALBUMIN in the last 168 hours. No results for input(s): LIPASE, AMYLASE in the last 168 hours. No results for input(s): AMMONIA in the last 168 hours. Coagulation Profile: No results for input(s): INR, PROTIME in the last 168 hours. Cardiac Enzymes: No results for input(s): CKTOTAL, CKMB, CKMBINDEX, TROPONINI in the last 168 hours. BNP (last 3 results) No results for input(s): PROBNP in the last  8760 hours. HbA1C: No results for input(s): HGBA1C in the last 72 hours. CBG: Recent Labs  Lab 10/22/20 1137 10/22/20 1637 10/22/20 2105 10/23/20 0029 10/23/20 0647  GLUCAP 136* 190* 79 162* 100*   Lipid Profile: No results for input(s): CHOL, HDL, LDLCALC, TRIG, CHOLHDL, LDLDIRECT in the last 72 hours. Thyroid Function Tests: No results for input(s): TSH, T4TOTAL, FREET4, T3FREE, THYROIDAB in the last 72 hours. Anemia Panel: No results for input(s): VITAMINB12, FOLATE, FERRITIN, TIBC, IRON, RETICCTPCT in the last 72 hours. Sepsis Labs: No results for input(s): PROCALCITON, LATICACIDVEN in the last 168 hours.  Recent Results (from the past 240 hour(s))  Resp Panel by RT-PCR (Flu A&B, Covid) Nasopharyngeal Swab     Status: None   Collection Time: 10/19/20  9:59 PM   Specimen: Nasopharyngeal Swab;  Nasopharyngeal(NP) swabs in vial transport medium  Result Value Ref Range Status   SARS Coronavirus 2 by RT PCR NEGATIVE NEGATIVE Final    Comment: (NOTE) SARS-CoV-2 target nucleic acids are NOT DETECTED.  The SARS-CoV-2 RNA is generally detectable in upper respiratory specimens during the acute phase of infection. The lowest concentration of SARS-CoV-2 viral copies this assay can detect is 138 copies/mL. A negative result does not preclude SARS-Cov-2 infection and should not be used as the sole basis for treatment or other patient management decisions. A negative result may occur with  improper specimen collection/handling, submission of specimen other than nasopharyngeal swab, presence of viral mutation(s) within the areas targeted by this assay, and inadequate number of viral copies(<138 copies/mL). A negative result must be combined with clinical observations, patient history, and epidemiological information. The expected result is Negative.  Fact Sheet for Patients:  BloggerCourse.com  Fact Sheet for Healthcare Providers:  SeriousBroker.it  This test is no t yet approved or cleared by the [place] FDA and  has been authorized for detection and/or diagnosis of SARS-CoV-2 by FDA under an Emergency Use Authorization (EUA). This EUA will remain  in effect (meaning this test can be used) for the duration of the COVID-19 declaration under Section 564(b)(1) of the Act, 21 U.S.C.section 360bbb-3(b)(1), unless the authorization is terminated  or revoked sooner.       Influenza A by PCR NEGATIVE NEGATIVE Final   Influenza B by PCR NEGATIVE NEGATIVE Final    Comment: (NOTE) The Xpert Xpress SARS-CoV-2/FLU/RSV plus assay is intended as an aid in the diagnosis of influenza from Nasopharyngeal swab specimens and should not be used as a sole basis for treatment. Nasal washings and aspirates are unacceptable for Xpert Xpress  SARS-CoV-2/FLU/RSV testing.  Fact Sheet for Patients: BloggerCourse.com  Fact Sheet for Healthcare Providers: SeriousBroker.it  This test is not yet approved or cleared by the [place] FDA and has been authorized for detection and/or diagnosis of SARS-CoV-2 by FDA under an Emergency Use Authorization (EUA). This EUA will remain in effect (meaning this test can be used) for the duration of the COVID-19 declaration under Section 564(b)(1) of the Act, 21 U.S.C. section 360bbb-3(b)(1), unless the authorization is terminated or revoked.  Performed at [facility], [address] [place], [place] [address]          Radiology Studies: No results found.      Scheduled Meds:  apixaban  2.5 mg Oral BID   donepezil  5 mg Oral Daily   insulin aspart  0-5 Units Subcutaneous QHS   insulin aspart  0-9 Units Subcutaneous TID WC   metoprolol tartrate  12.5 mg Oral BID  senna-docusate  2 tablet Oral QHS   Continuous Infusions:  Assessment & Plan:   Active Problems:   S/P right hip fracture  Right periprosthetic hip fracture-orthopedic surgery consulted, appreciate input. Seen by Dr. Susa Simmonds on 7/10, and it appears that the implant is stable and she has cortical destruction likely muscular avulsion type injuries. 7/13 he recommends nonoperative treatment at this point, weightbearing as tolerated, PT recommends SNF   Active Problems Paroxysmal A. fib Continue Eliquis and metoprolol   Mild dementia Continue Aricept   CKD 3A Stable at baseline   Essential hypertension -continue metoprolol, blood pressure acceptable   DVT prophylaxis: Eliquis  Code Status: Full Family Communication: None at bedside Disposition Plan:  Status is: Inpatient  Remains inpatient appropriate because:Inpatient level of care appropriate due to severity of illness  Dispo:  Patient From: Home  Planned Disposition: Skilled Nursing  Facility  Medically stable for discharge: No              LOS: 4 days   Time spent: 35 minutes with more than 50% on COC    Lynn Ito, MD [facility] Pager [phone]  If 7PM-7AM, please contact night-coverage 10/23/2020, 8:41 AM

## 2020-10-23 NOTE — Plan of Care (Signed)
°  Problem: Activity: °Goal: Ability to avoid complications of mobility impairment will improve °Outcome: Progressing °Goal: Ability to tolerate increased activity will improve °Outcome: Progressing °  °Problem: Education: °Goal: Verbalization of understanding the information provided will improve °Outcome: Progressing °  °

## 2020-10-23 NOTE — TOC Initial Note (Addendum)
Transition of Care [facility]) - Initial/Assessment Note    Patient Details  Name: [name] MRN: [id number] Date of Birth: 1928/05/15  Transition of Care [facility]) CM/SW Contact:    Ralene Bathe, LCSWA Phone Number: 10/23/2020, 1:33 PM  Clinical Narrative:                  CSW received consult for possible SNF placement at time of discharge. CSW spoke with patient at bedside. The patient's spouse was on the phone during encounter.  The family does not want for the patient to go to a SNF.  The patient and her spouse are requesting CIR.  If CIR is not an option, the patient and spouse want for the patient to go home with home health.  The patient's spouse reports that he is at the home 24/7 and can supervise the patient.  CSW notified attending, PT, CIR rep, and RNCM of the above information.    CSW received notification that the patient will not qualify for CIR and that the patient and husband are not open to SNF  16:07- CSW called the patient's husband, [name], [phone] and spoke with him about the information above.  The patient's spouse reported that the family has not made a definite decision, but are open to SNF.  The spouse informed CSW that their daughter assist with in these situations.  The daughter's contact info is below.    [name] [phone].    CSW attempted to speak with the patient twice and the patient was unavailable.  CSW will reach out to the patient tomorrow to inquire about the patient's d/c decision and receive consent to send referrals to SNFs.           Patient Goals and CMS Choice        Expected Discharge Plan and Services                                                Prior Living Arrangements/Services                       Activities of Daily Living Home Assistive Devices/Equipment: Dan Humphreys (specify type) ADL Screening (condition at time of admission) Patient's cognitive ability adequate to safely  complete daily activities?: Yes Is the patient deaf or have difficulty hearing?: No Does the patient have difficulty seeing, even when wearing glasses/contacts?: No Does the patient have difficulty concentrating, remembering, or making decisions?: No Patient able to express need for assistance with ADLs?: Yes Does the patient have difficulty dressing or bathing?: Yes Independently performs ADLs?: Yes (appropriate for developmental age) Does the patient have difficulty walking or climbing stairs?: Yes Weakness of Legs: Both Weakness of Arms/Hands: None  Permission Sought/Granted                  Emotional Assessment              Admission diagnosis:  Fall [W19.XXXA] Fall, initial encounter [id number]] Closed fracture of right hip, initial encounter (HCC) [S72.001A] S/P right hip fracture [Z87.81] Patient Active Problem List   Diagnosis Date Noted   S/P right hip fracture 10/19/2020   PCP:  Hannah Salisbury, MD Pharmacy:   [facility] [phone] - [place], [place] - [phone] AT [facility] [address] [place]  [address] Phone: [phone] Fax: [phone]  [facility] [address] [place] [address] Phone: [phone] Fax: [phone]     Social Determinants of Health (SDOH) Interventions    Readmission Risk Interventions No flowsheet data found.

## 2020-10-24 DIAGNOSIS — I10 Essential (primary) hypertension: Secondary | ICD-10-CM | POA: Diagnosis not present

## 2020-10-24 DIAGNOSIS — I4891 Unspecified atrial fibrillation: Secondary | ICD-10-CM | POA: Diagnosis not present

## 2020-10-24 DIAGNOSIS — Z8781 Personal history of (healed) traumatic fracture: Secondary | ICD-10-CM | POA: Diagnosis not present

## 2020-10-24 LAB — GLUCOSE, CAPILLARY
Glucose-Capillary: 109 mg/dL — ABNORMAL HIGH (ref 70–99)
Glucose-Capillary: 227 mg/dL — ABNORMAL HIGH (ref 70–99)
Glucose-Capillary: 69 mg/dL — ABNORMAL LOW (ref 70–99)
Glucose-Capillary: 163 mg/dL — ABNORMAL HIGH (ref 70–99)
Glucose-Capillary: 142 mg/dL — ABNORMAL HIGH (ref 70–99)
Glucose-Capillary: 159 mg/dL — ABNORMAL HIGH (ref 70–99)
Glucose-Capillary: 110 mg/dL — ABNORMAL HIGH (ref 70–99)

## 2020-10-24 NOTE — Progress Notes (Signed)
Physical Therapy Treatment Patient Details Name: [name] MRN: [id number] DOB: [birth year] Today's Date: 10/24/2020    History of Present Illness 87 yo female admitted 10/19/20 post fall at home.Work-up reveals R hip periprosthetic fracture, determined non surgical, WBAT.  PMHx:  PAF, dementia, CKD stage 3, essential HTN, DM type II, R THA 2005.    PT Comments    Pt supine in bed this session with motivation to move.  Pt performed increased gt and tolerated supine exercises.  Pt continues to improve. Unclear how much support she has at home.   Will continue to recommend snf unless she has adequate support to return home.  If she does have assistance at home will update recommendations but at this time will continue rec for snf until more information is revealed.    Follow Up Recommendations  SNF (if she has adequate physical support she would be a great candidate for HHPT.)     Equipment Recommendations  Rolling walker with 5" wheels;3in1 (PT) (youth height RW)    Recommendations for Other Services       Precautions / Restrictions Precautions Precautions: Fall Restrictions Weight Bearing Restrictions: Yes RLE Weight Bearing: Weight bearing as tolerated    Mobility  Bed Mobility Overal bed mobility: Needs Assistance Bed Mobility: Supine to Sit     Supine to sit: Supervision     General bed mobility comments: Increased time and effort but able to scoot to move and scoot to edge of bed unassisted.    Transfers Overall transfer level: Needs assistance Equipment used: Rolling walker (2 wheeled) (youth height) Transfers: Sit to/from Stand Sit to Stand: Mod assist         General transfer comment: Pt with posterior translation rising into standing. Cues for hand placement to and from seated surface.  Min guard for stand to sit.  Ambulation/Gait Ambulation/Gait assistance: Mod assist;Min assist Gait Distance (Feet): 200 Feet (initially mod assistance with posterior  pbias but as gt progress balance improved and required Min assistance.) Assistive device: Rolling walker (2 wheeled) (youth height) Gait Pattern/deviations: Step-through pattern;Trunk flexed;Drifts right/left;Narrow base of support;Decreased stride length;Decreased stance time - right;Decreased weight shift to right     General Gait Details: Pt required cues for upper trunk control and forward gaze and to increase BOS.  assistance to negotiate obstacles in halls.  Pt intially required mod assistance but quickly moved to min assistance as tx progressed.   Stairs             Wheelchair Mobility    Modified Rankin (Stroke Patients Only)       Balance Overall balance assessment: History of Falls;Needs assistance Sitting-balance support: Feet supported Sitting balance-Leahy Scale: Fair       Standing balance-Leahy Scale: Poor                              Cognition Arousal/Alertness: Awake/alert Behavior During Therapy: WFL for tasks assessed/performed Overall Cognitive Status: History of cognitive impairments - at baseline                                        Exercises General Exercises - Lower Extremity Ankle Circles/Pumps: AROM;Both;10 reps;Supine Quad Sets: AROM;Right;10 reps;Supine Heel Slides: AROM;Right;10 reps;Supine Hip ABduction/ADduction: AROM;Right;10 reps;Supine    General Comments        Pertinent Vitals/Pain Pain Assessment: Faces Faces  Pain Scale: Hurts little more Pain Location: R hip with mobility Pain Descriptors / Indicators: Guarding Pain Intervention(s): Monitored during session;Repositioned    Home Living                      Prior Function            PT Goals (current goals can now be found in the care plan section) Acute Rehab PT Goals Patient Stated Goal: to go home to her cane Potential to Achieve Goals: Good Progress towards PT goals: Progressing toward goals    Frequency            PT Plan Current plan remains appropriate    Co-evaluation              AM-PAC PT "6 Clicks" Mobility   Outcome Measure  Help needed turning from your back to your side while in a flat bed without using bedrails?: A Little Help needed moving from lying on your back to sitting on the side of a flat bed without using bedrails?: A Lot Help needed moving to and from a bed to a chair (including a wheelchair)?: A Lot Help needed standing up from a chair using your arms (e.g., wheelchair or bedside chair)?: A Lot Help needed to walk in hospital room?: A Lot Help needed climbing 3-5 steps with a railing? : Total 6 Click Score: 12    End of Session Equipment Utilized During Treatment: Gait belt Activity Tolerance: Patient tolerated treatment well Patient left: in chair;with call bell/phone within reach;with chair alarm set Nurse Communication: Mobility status PT Visit Diagnosis: Unsteadiness on feet (R26.81);Muscle weakness (generalized) (M62.81);History of falling (Z91.81);Pain Pain - Right/Left: Right Pain - part of body: Hip     Time: 2505-3976 PT Time Calculation (min) (ACUTE ONLY): 25 min  Charges:  $Gait Training: 8-22 mins $Therapeutic Exercise: 8-22 mins                     Bonney Leitz , PTA Acute Rehabilitation Services Pager [phone] Office [phone]    Ilia Engelbert Artis Delay 10/24/2020, 11:46 AM

## 2020-10-24 NOTE — Plan of Care (Signed)
  Problem: Education: Goal: Verbalization of understanding the information provided will improve Outcome: Progressing   Problem: Coping: Goal: Level of anxiety will decrease Outcome: Progressing   Problem: Physical Regulation: Goal: Postoperative complications will be avoided or minimized Outcome: Progressing   Problem: Respiratory: Goal: Ability to maintain a clear airway will improve Outcome: Progressing   Problem: Pain Management: Goal: Pain level will decrease Outcome: Progressing   Problem: Skin Integrity: Goal: Signs of wound healing will improve Outcome: Progressing   Problem: Tissue Perfusion: Goal: Ability to maintain adequate tissue perfusion will improve Outcome: Progressing   Problem: Education: Goal: Knowledge of General Education information will improve Description: Including pain rating scale, medication(s)/side effects and non-pharmacologic comfort measures Outcome: Progressing   Problem: Health Behavior/Discharge Planning: Goal: Ability to manage health-related needs will improve Outcome: Progressing   Problem: Clinical Measurements: Goal: Ability to maintain clinical measurements within normal limits will improve Outcome: Progressing Goal: Will remain free from infection Outcome: Progressing Goal: Diagnostic test results will improve Outcome: Progressing Goal: Respiratory complications will improve Outcome: Progressing Goal: Cardiovascular complication will be avoided Outcome: Progressing   Problem: Clinical Measurements: Goal: Ability to maintain clinical measurements within normal limits will improve Outcome: Progressing Goal: Will remain free from infection Outcome: Progressing Goal: Diagnostic test results will improve Outcome: Progressing Goal: Respiratory complications will improve Outcome: Progressing Goal: Cardiovascular complication will be avoided Outcome: Progressing   Problem: Elimination: Goal: Will not experience  complications related to bowel motility Outcome: Progressing Goal: Will not experience complications related to urinary retention Outcome: Progressing   Problem: Pain Managment: Goal: General experience of comfort will improve Outcome: Progressing   Problem: Safety: Goal: Ability to remain free from injury will improve Outcome: Progressing

## 2020-10-24 NOTE — Plan of Care (Signed)
  Problem: Education: Goal: Knowledge of General Education information will improve Description: Including pain rating scale, medication(s)/side effects and non-pharmacologic comfort measures Outcome: Progressing   Problem: Health Behavior/Discharge Planning: Goal: Ability to manage health-related needs will improve Outcome: Progressing   Problem: Clinical Measurements: Goal: Ability to maintain clinical measurements within normal limits will improve Outcome: Progressing   Problem: Activity: Goal: Risk for activity intolerance will decrease Outcome: Progressing   Problem: Nutrition: Goal: Adequate nutrition will be maintained Outcome: Progressing   Problem: Coping: Goal: Level of anxiety will decrease Outcome: Progressing   Problem: Pain Managment: Goal: General experience of comfort will improve Outcome: Progressing   Problem: Elimination: Goal: Will not experience complications related to bowel motility Outcome: Progressing   Problem: Safety: Goal: Ability to remain free from injury will improve Outcome: Progressing   Problem: Skin Integrity: Goal: Risk for impaired skin integrity will decrease Outcome: Progressing   

## 2020-10-24 NOTE — TOC Progression Note (Addendum)
Transition of Care [facility]) - Progression Note    Patient Details  Name: [name] MRN: [id number] Date of Birth: [birth year]  Transition of Care [facility]) CM/SW Contact  Milinda Antis, [place] Phone Number: 10/24/2020, 11:57 AM  Clinical Narrative:     10:40-  CSW met with the patient at bedside to inquire about willingness to go to a SNF.  The patient was oriented to place, situation, and person, but disoriented to time.  The patient was easily distracted when attempting to complete task, but easily redirected.  The patient report that she has been ambulating and wants to go home and "no one can tell her what to do".  The patient was very pleasant, in good spirits, and laughing during this encounter.  The patient lives with her spouse, reports that she has an Engineer, production or RN who comes to the home 3 days a week,  and states that her neighbor can also assist if needed.  Patient reports that the neighbor was already assisting prior to admission.    CSW will speak with the patient's family and verify natural supports.  14:17-  CSW called the patient's husband to discuss d/c planning.  [name] reports that the patient has also informed him that she is not wanting to go to a facility and wants to go home.  He reports that there is a neighbor who helps care for the patient and has done so for many years, but the neighbor is out of town and [name] does not want to bother her.  [name] asked that CSW contact the daughter [name] to assist with planning.  [name] reported that [name] is in [place] and should be returning Saturday, but may still answer the phone  14:27-  CSW contacted [name], daughter, [phone].  There was no answer.  CSW left a VM requesting a returned call.         Expected Discharge Plan and Services                                                 Social Determinants of Health (SDOH) Interventions    Readmission Risk Interventions No  flowsheet data found.

## 2020-10-24 NOTE — Progress Notes (Signed)
PROGRESS NOTE    [name]  IOE:[id number] DOB: [birth year] DOA: 10/19/2020 PCP: Nelwyn Salisbury, MD    Brief Narrative:  87 year old female with history of paroxysmal A. fib on Eliquis, mild dementia, right THA in 2005 who comes to the hospital after a fall at home.  She slipped on some water and fell on the kitchen floor.  She hit her head and landed on her right hip.  Work-up in the ED was concerning for right hip periprosthetic fracture and she was admitted to the hospital with orthopedic surgery consultation.  7/14- no complaints today. No overnight issues   Consultants:    Procedures:   Antimicrobials:      Subjective: Not much hip pain, sob, or cp  Objective: Vitals:   10/23/20 1510 10/23/20 2059 10/24/20 0748 10/24/20 1516  BP: (!) 122/54 (!) 109/48 (!) 138/58 139/61  Pulse: 66 70 61 74  Resp: 16 18 16    Temp: 97.7 F (36.5 C) 98.2 F (36.8 C) 98.6 F (37 C) 98.5 F (36.9 C)  TempSrc: Oral Oral Oral Oral  SpO2: 100% 95% 100% 98%  Weight:      Height:        Intake/Output Summary (Last 24 hours) at 10/24/2020 1819 Last data filed at 10/23/2020 2200 Gross per 24 hour  Intake 240 ml  Output 100 ml  Net 140 ml   Filed Weights   10/19/20 1932 10/19/20 2000  Weight: 54.4 kg 54.4 kg    Examination: Nad, calm Cta no w/r Regular s1/s2 no gallop Soft benign +bs No edema Awake and alert. Grossly intact   Data Reviewed: I have personally reviewed following labs and imaging studies  CBC: Recent Labs  Lab 10/19/20 1947 10/20/20 0029 10/21/20 0142 10/22/20 0706 10/23/20 0416  WBC 4.7 6.9 5.4 5.4 4.6  NEUTROABS 2.8  --   --   --   --   HGB 10.9* 10.9* 9.8* 12.0 10.3*  HCT 32.4* 31.8* 29.5* 35.6* 31.1*  MCV 95.3 94.6 95.8 93.2 95.1  PLT 142* 133* 111* 104* 126*   Basic Metabolic Panel: Recent Labs  Lab 10/19/20 1947 10/20/20 0029 10/21/20 0142 10/22/20 0706 10/23/20 0416  NA 137 141 137 138 137  K 3.9 3.9 3.9 3.7 4.1  CL 105 107 104  105 106  CO2 24 27 26 25 25   GLUCOSE 139* 119* 91 120* 122*  BUN 25* 21 19 14 19   CREATININE 1.20* 1.04* 0.91 0.95 1.03*  CALCIUM 9.2 9.4 8.8* 8.7* 8.8*  MG  --  1.9  --   --   --   PHOS  --  4.0  --   --   --    GFR: Estimated Creatinine Clearance: 28.6 mL/min (A) (by C-G formula based on SCr of 1.03 mg/dL (H)). Liver Function Tests: No results for input(s): AST, ALT, ALKPHOS, BILITOT, PROT, ALBUMIN in the last 168 hours. No results for input(s): LIPASE, AMYLASE in the last 168 hours. No results for input(s): AMMONIA in the last 168 hours. Coagulation Profile: No results for input(s): INR, PROTIME in the last 168 hours. Cardiac Enzymes: No results for input(s): CKTOTAL, CKMB, CKMBINDEX, TROPONINI in the last 168 hours. BNP (last 3 results) No results for input(s): PROBNP in the last 8760 hours. HbA1C: No results for input(s): HGBA1C in the last 72 hours. CBG: Recent Labs  Lab 10/24/20 1003 10/24/20 1009 10/24/20 1031 10/24/20 1321 10/24/20 1606  GLUCAP 69* 227* 163* 142* 159*   Lipid Profile: No results  for input(s): CHOL, HDL, LDLCALC, TRIG, CHOLHDL, LDLDIRECT in the last 72 hours. Thyroid Function Tests: No results for input(s): TSH, T4TOTAL, FREET4, T3FREE, THYROIDAB in the last 72 hours. Anemia Panel: No results for input(s): VITAMINB12, FOLATE, FERRITIN, TIBC, IRON, RETICCTPCT in the last 72 hours. Sepsis Labs: No results for input(s): PROCALCITON, LATICACIDVEN in the last 168 hours.  Recent Results (from the past 240 hour(s))  Resp Panel by RT-PCR (Flu A&B, Covid) Nasopharyngeal Swab     Status: None   Collection Time: 10/19/20  9:59 PM   Specimen: Nasopharyngeal Swab; Nasopharyngeal(NP) swabs in vial transport medium  Result Value Ref Range Status   SARS Coronavirus 2 by RT PCR NEGATIVE NEGATIVE Final    Comment: (NOTE) SARS-CoV-2 target nucleic acids are NOT DETECTED.  The SARS-CoV-2 RNA is generally detectable in upper respiratory specimens during the  acute phase of infection. The lowest concentration of SARS-CoV-2 viral copies this assay can detect is 138 copies/mL. A negative result does not preclude SARS-Cov-2 infection and should not be used as the sole basis for treatment or other patient management decisions. A negative result may occur with  improper specimen collection/handling, submission of specimen other than nasopharyngeal swab, presence of viral mutation(s) within the areas targeted by this assay, and inadequate number of viral copies(<138 copies/mL). A negative result must be combined with clinical observations, patient history, and epidemiological information. The expected result is Negative.  Fact Sheet for Patients:  BloggerCourse.com  Fact Sheet for Healthcare Providers:  SeriousBroker.it  This test is no t yet approved or cleared by the [place] FDA and  has been authorized for detection and/or diagnosis of SARS-CoV-2 by FDA under an Emergency Use Authorization (EUA). This EUA will remain  in effect (meaning this test can be used) for the duration of the COVID-19 declaration under Section 564(b)(1) of the Act, 21 U.S.C.section 360bbb-3(b)(1), unless the authorization is terminated  or revoked sooner.       Influenza A by PCR NEGATIVE NEGATIVE Final   Influenza B by PCR NEGATIVE NEGATIVE Final    Comment: (NOTE) The Xpert Xpress SARS-CoV-2/FLU/RSV plus assay is intended as an aid in the diagnosis of influenza from Nasopharyngeal swab specimens and should not be used as a sole basis for treatment. Nasal washings and aspirates are unacceptable for Xpert Xpress SARS-CoV-2/FLU/RSV testing.  Fact Sheet for Patients: BloggerCourse.com  Fact Sheet for Healthcare Providers: SeriousBroker.it  This test is not yet approved or cleared by the [place] FDA and has been authorized for detection and/or  diagnosis of SARS-CoV-2 by FDA under an Emergency Use Authorization (EUA). This EUA will remain in effect (meaning this test can be used) for the duration of the COVID-19 declaration under Section 564(b)(1) of the Act, 21 U.S.C. section 360bbb-3(b)(1), unless the authorization is terminated or revoked.  Performed at [facility], [address], [place], [place] [address]          Radiology Studies: No results found.      Scheduled Meds:  apixaban  2.5 mg Oral BID   donepezil  5 mg Oral Daily   insulin aspart  0-5 Units Subcutaneous QHS   insulin aspart  0-9 Units Subcutaneous TID WC   metoprolol tartrate  12.5 mg Oral BID   senna-docusate  2 tablet Oral QHS   Continuous Infusions:  Assessment & Plan:   Active Problems:   S/P right hip fracture  Right periprosthetic hip fracture-orthopedic surgery consulted, appreciate input. Seen by Dr. Susa Simmonds on 7/10, and  it appears that the implant is stable and she has cortical destruction likely muscular avulsion type injuries. 7/14-Ortho rec. Nonoperative treatment at this point, weigghtbearing as tolerated. PT rec. SNF.     Active Problems Paroxysmal A. fib Continue Eliquis and beta blk   Mild dementia Continue Aricept   CKD 3A Stable and at baseline   Essential hypertension -continue metoprolol   DVT prophylaxis: Eliquis  Code Status: Full Family Communication: None at bedside Disposition Plan:  Status is: Inpatient  Remains inpatient appropriate because:Inpatient level of care appropriate due to severity of illness  Dispo:  Patient From: Home  Planned Disposition: Skilled Nursing Facility  Medically stable for discharge: No              LOS: 5 days   Time spent: 35 minutes with more than 50% on COC    Lynn Ito, MD [facility] Pager [phone]  If 7PM-7AM, please contact night-coverage 10/24/2020, 6:19 PM

## 2020-10-25 ENCOUNTER — Other Ambulatory Visit (HOSPITAL_COMMUNITY): Payer: Self-pay

## 2020-10-25 DIAGNOSIS — Z8781 Personal history of (healed) traumatic fracture: Secondary | ICD-10-CM | POA: Diagnosis not present

## 2020-10-25 LAB — GLUCOSE, CAPILLARY
Glucose-Capillary: 113 mg/dL — ABNORMAL HIGH (ref 70–99)
Glucose-Capillary: 190 mg/dL — ABNORMAL HIGH (ref 70–99)
Glucose-Capillary: 110 mg/dL — ABNORMAL HIGH (ref 70–99)
Glucose-Capillary: 106 mg/dL — ABNORMAL HIGH (ref 70–99)

## 2020-10-25 MED ORDER — HYDROCODONE-ACETAMINOPHEN 5-325 MG PO TABS
1.0000 | ORAL_TABLET | Freq: Four times a day (QID) | ORAL | 0 refills | Status: DC | PRN
  Filled 2020-10-25: qty 10, 2d supply, fill #0

## 2020-10-25 MED ORDER — METOPROLOL TARTRATE 25 MG PO TABS
12.5000 mg | ORAL_TABLET | Freq: Two times a day (BID) | ORAL | 3 refills | Status: DC
  Filled 2020-10-25: qty 30, 30d supply, fill #0

## 2020-10-25 NOTE — Progress Notes (Signed)
Physical Therapy Treatment Patient Details Name: [name] MRN: [id number] DOB: 1928/07/06 Today's Date: 10/25/2020    History of Present Illness 87 yo female admitted 10/19/20 post fall at home.Work-up reveals R hip periprosthetic fracture, determined non surgical, WBAT.  PMHx:  PAF, dementia, CKD stage 3, essential HTN, DM type II, R THA 2005.    PT Comments    Pt seated in recliner with daughter at her bedside.  Pt's daughter reports she lacks caregiver support at home and lives with her husband who also has dementia.  Based on inadequate support SNF remains to be the best option for the patient to recover.  This will allow her to improve strength and function before returning home.      Follow Up Recommendations  SNF     Equipment Recommendations  Rolling walker with 5" wheels;3in1 (PT) (youth height)    Recommendations for Other Services       Precautions / Restrictions Precautions Precautions: Fall Precaution Comments: no history to indicate if hip previously had precautions Restrictions Weight Bearing Restrictions: Yes RLE Weight Bearing: Weight bearing as tolerated    Mobility  Bed Mobility Overal bed mobility: Needs Assistance Bed Mobility: Supine to Sit     Supine to sit: Supervision     General bed mobility comments: Pt seated in recliner on arrival.    Transfers Overall transfer level: Needs assistance Equipment used: Rolling walker (2 wheeled) Transfers: Sit to/from Stand Sit to Stand: Mod assist         General transfer comment: Cues for hand placement to and from seated surface.  Pt presents with posterior lean and required increased time to improve standing balance.  Pt performed additional transfer from commode and required mod assistance to maintain balance during pericare.  Ambulation/Gait Ambulation/Gait assistance: Mod assist;Min assist (assist level continues to vary.) Gait Distance (Feet): 10 Feet (+ 75 ft.  Decreased distance due to  fatigue from increased time standing in bathroom.) Assistive device: Rolling walker (2 wheeled) (youth height) Gait Pattern/deviations: Step-through pattern;Trunk flexed;Drifts right/left;Narrow base of support;Decreased stride length;Decreased stance time - right;Decreased weight shift to right     General Gait Details: Pt required cues for upper trunk control and forward gaze and to increase BOS.  Continued assistance to negotiate obstacles in halls.  Pt intially required mod assistance but quickly moved to min assistance as tx progressed.   Stairs             Wheelchair Mobility    Modified Rankin (Stroke Patients Only)       Balance Overall balance assessment: History of Falls;Needs assistance Sitting-balance support: Feet supported;No upper extremity supported Sitting balance-Leahy Scale: Fair     Standing balance support: Bilateral upper extremity supported;During functional activity Standing balance-Leahy Scale: Poor Standing balance comment: Pt stood at RW for pericare and presents with posterior lean and mod assistance to maintain balance.                            Cognition Arousal/Alertness: Awake/alert Behavior During Therapy: WFL for tasks assessed/performed Overall Cognitive Status: History of cognitive impairments - at baseline                                        Exercises      General Comments        Pertinent Vitals/Pain Pain Assessment: Faces  Faces Pain Scale: Hurts little more Pain Location: R hip with mobility Pain Descriptors / Indicators: Guarding;Grimacing Pain Intervention(s): Monitored during session;Repositioned    Home Living Family/patient expects to be discharged to:: Skilled nursing facility Living Arrangements: Children Available Help at Discharge: Family;Available 24 hours/day Type of Home: House         Additional Comments: pt is unable to give a reliable history    Prior Function Level  of Independence: Needs assistance  Gait / Transfers Assistance Needed: Pt unable to give a history, reports that she lives with her husband, children and dog ADL's / Homemaking Assistance Needed: Pt reports that she has assist only for showers and home mgt     PT Goals (current goals can now be found in the care plan section) Acute Rehab PT Goals Patient Stated Goal: Per family to go to rehab Potential to Achieve Goals: Good Progress towards PT goals: Progressing toward goals    Frequency    Min 3X/week      PT Plan Current plan remains appropriate    Co-evaluation              AM-PAC PT "6 Clicks" Mobility   Outcome Measure  Help needed turning from your back to your side while in a flat bed without using bedrails?: A Little Help needed moving from lying on your back to sitting on the side of a flat bed without using bedrails?: A Lot Help needed moving to and from a bed to a chair (including a wheelchair)?: A Lot Help needed standing up from a chair using your arms (e.g., wheelchair or bedside chair)?: A Lot Help needed to walk in hospital room?: A Lot Help needed climbing 3-5 steps with a railing? : Total 6 Click Score: 12    End of Session Equipment Utilized During Treatment: Gait belt Activity Tolerance: Patient tolerated treatment well Patient left: in chair;with call bell/phone within reach;with chair alarm set Nurse Communication: Mobility status PT Visit Diagnosis: Unsteadiness on feet (R26.81);Muscle weakness (generalized) (M62.81);History of falling (Z91.81);Pain Pain - Right/Left: Right Pain - part of body: Hip     Time: 1430-1456 PT Time Calculation (min) (ACUTE ONLY): 26 min  Charges:  $Gait Training: 8-22 mins $Therapeutic Activity: 8-22 mins                     Bonney Leitz , PTA Acute Rehabilitation Services Pager [phone] Office [phone]    Abraham Entwistle Artis Delay 10/25/2020, 3:10 PM

## 2020-10-25 NOTE — Plan of Care (Signed)

## 2020-10-25 NOTE — Progress Notes (Signed)
[facility] PROGRESS NOTE    [name]  WJX:[id number] DOB: [birth year] DOA: 10/19/2020 PCP: Nelwyn Salisbury, MD      Brief Narrative:  [name] is a 87 y.o. F with pAF on Eliquis, dementia, and hx right THA in 2005 who presented after fall.  Patient had a mechanical fall, landed on right hip.  In the ER, x-ray showed right periprosthetic hip fracture.      Assessment & Plan:  Right periprosthetic hip fracture Patient was admitted and evaluated by orthopedic surgery.  Their hip specialist reviewed the case, and recommended nonoperative management, weight bearing as tolerated and follow up in office in 3 weeks.  PT recommend SNF. -TOC consult for SNF -May be weight bearing as tolerated -Follow up with Dr. Turner [name] as an outpatient in 3 weeks    Paroxysmal atrial fibrillation Heart rate controlled - Continue Eliquis -Resume metoprolol metoprolol  CKD stage IIIa Creatinine stable relative to baseline  Dementia PT initially recommended CIR, but patient was declined by CIR.  Subsequent recommendations were for SNF, although patient expressed opposition to this.  Given, the patient does not appear to have capacity for decision making, TOC discussed with husband who agreed patient should not go to SNF and [facility] team made repeated efforts to reach daughters this week without success.    Today, after husband reported he would come to hospital with a caregiver to get patient and honor her wishes to be cared for at home, given improvement with PT, patient's daughter [name] called to say the husband had advanced dementia, had gotten lost, and that she was the only safe decision maker for her parents and as their surrogate decision maker, she would agree to recommended SNF placement. -Continue donepezil          Disposition: Status is: Inpatient  Remains inpatient appropriate because:Unsafe d/c plan  Dispo:  Patient From: Home  Planned Disposition:  Home  Medically stable for discharge: No         Level of care: Telemetry Surgical       MDM: The below labs and imaging reports were reviewed and summarized above.  Medication management as above.    DVT prophylaxis: apixaban (ELIQUIS) tablet 2.5 mg Start: 10/20/20 1145 SCDs Start: 10/19/20 2248 apixaban (ELIQUIS) tablet 2.5 mg  Code Status: FULL         Subjective: No pain.  The patient has not had pain medicine in 2 days.  She has no headache, no chest pain, dyspnea, no abdominal pain.  She is pleasant.  Objective: Vitals:   10/24/20 0748 10/24/20 1516 10/24/20 2017 10/25/20 0918  BP: (!) 138/58 139/61 (!) 108/42 (!) 121/47  Pulse: 61 74 70 70  Resp: 16  18   Temp: 98.6 F (37 C) 98.5 F (36.9 C) 98.2 F (36.8 C) 97.7 F (36.5 C)  TempSrc: Oral Oral Oral Oral  SpO2: 100% 98% 98% 99%  Weight:      Height:        Intake/Output Summary (Last 24 hours) at 10/25/2020 1634 Last data filed at 10/25/2020 0612 Gross per 24 hour  Intake --  Output 800 ml  Net -800 ml   Filed Weights   10/19/20 1932 10/19/20 2000  Weight: 54.4 kg 54.4 kg    Examination: General appearance: Thin elderly adult female, alert and in no acute distress.  Sitting up in bed, conversational. HEENT: Anicteric, conjunctiva pink, lids and lashes normal. No nasal deformity, discharge, epistaxis.  Lips moist.  Skin: Warm and dry.  no jaundice.  No suspicious rashes or lesions. Cardiac: RRR, nl S1-S2, no murmurs appreciated.  Marland Kitchen Respiratory: Normal respiratory rate and rhythm.  CTAB without rales or wheezes. Abdomen:    MSK: Diffuse loss of subcutaneous muscle mass and fat.  I see no deformities or effusions of the lower extremities.   Neuro: Awake and alert.  EOMI, moves all extremities with generalized weakness but symmetric coordination.Marland Kitchen Speech fluent.    Psych: Sensorium intact and responding to questions, attention normal. Affect pleasant.  Judgment and insight appear  impaired.    Data Reviewed: I have personally reviewed following labs and imaging studies:  CBC: Recent Labs  Lab 10/19/20 1947 10/20/20 0029 10/21/20 0142 10/22/20 0706 10/23/20 0416  WBC 4.7 6.9 5.4 5.4 4.6  NEUTROABS 2.8  --   --   --   --   HGB 10.9* 10.9* 9.8* 12.0 10.3*  HCT 32.4* 31.8* 29.5* 35.6* 31.1*  MCV 95.3 94.6 95.8 93.2 95.1  PLT 142* 133* 111* 104* 126*   Basic Metabolic Panel: Recent Labs  Lab 10/19/20 1947 10/20/20 0029 10/21/20 0142 10/22/20 0706 10/23/20 0416  NA 137 141 137 138 137  K 3.9 3.9 3.9 3.7 4.1  CL 105 107 104 105 106  CO2 24 27 26 25 25   GLUCOSE 139* 119* 91 120* 122*  BUN 25* 21 19 14 19   CREATININE 1.20* 1.04* 0.91 0.95 1.03*  CALCIUM 9.2 9.4 8.8* 8.7* 8.8*  MG  --  1.9  --   --   --   PHOS  --  4.0  --   --   --    GFR: Estimated Creatinine Clearance: 28.6 mL/min (A) (by C-G formula based on SCr of 1.03 mg/dL (H)). Liver Function Tests: No results for input(s): AST, ALT, ALKPHOS, BILITOT, PROT, ALBUMIN in the last 168 hours. No results for input(s): LIPASE, AMYLASE in the last 168 hours. No results for input(s): AMMONIA in the last 168 hours. Coagulation Profile: No results for input(s): INR, PROTIME in the last 168 hours. Cardiac Enzymes: No results for input(s): CKTOTAL, CKMB, CKMBINDEX, TROPONINI in the last 168 hours. BNP (last 3 results) No results for input(s): PROBNP in the last 8760 hours. HbA1C: No results for input(s): HGBA1C in the last 72 hours. CBG: Recent Labs  Lab 10/24/20 1321 10/24/20 1606 10/24/20 2205 10/25/20 0628 10/25/20 1054  GLUCAP 142* 159* 110* 113* 190*   Lipid Profile: No results for input(s): CHOL, HDL, LDLCALC, TRIG, CHOLHDL, LDLDIRECT in the last 72 hours. Thyroid Function Tests: No results for input(s): TSH, T4TOTAL, FREET4, T3FREE, THYROIDAB in the last 72 hours. Anemia Panel: No results for input(s): VITAMINB12, FOLATE, FERRITIN, TIBC, IRON, RETICCTPCT in the last 72 hours. Urine  analysis: No results found for: COLORURINE, APPEARANCEUR, LABSPEC, PHURINE, GLUCOSEU, HGBUR, BILIRUBINUR, KETONESUR, PROTEINUR, UROBILINOGEN, NITRITE, LEUKOCYTESUR Sepsis Labs: @LABRCNTIP (procalcitonin:4,lacticacidven:4)  ) Recent Results (from the past 240 hour(s))  Resp Panel by RT-PCR (Flu A&B, Covid) Nasopharyngeal Swab     Status: None   Collection Time: 10/19/20  9:59 PM   Specimen: Nasopharyngeal Swab; Nasopharyngeal(NP) swabs in vial transport medium  Result Value Ref Range Status   SARS Coronavirus 2 by RT PCR NEGATIVE NEGATIVE Final    Comment: (NOTE) SARS-CoV-2 target nucleic acids are NOT DETECTED.  The SARS-CoV-2 RNA is generally detectable in upper respiratory specimens during the acute phase of infection. The lowest concentration of SARS-CoV-2 viral copies this assay can detect is 138 copies/mL. A negative result does not  preclude SARS-Cov-2 infection and should not be used as the sole basis for treatment or other patient management decisions. A negative result may occur with  improper specimen collection/handling, submission of specimen other than nasopharyngeal swab, presence of viral mutation(s) within the areas targeted by this assay, and inadequate number of viral copies(<138 copies/mL). A negative result must be combined with clinical observations, patient history, and epidemiological information. The expected result is Negative.  Fact Sheet for Patients:  BloggerCourse.com  Fact Sheet for Healthcare Providers:  SeriousBroker.it  This test is no t yet approved or cleared by the [place] FDA and  has been authorized for detection and/or diagnosis of SARS-CoV-2 by FDA under an Emergency Use Authorization (EUA). This EUA will remain  in effect (meaning this test can be used) for the duration of the COVID-19 declaration under Section 564(b)(1) of the Act, 21 U.S.C.section 360bbb-3(b)(1), unless the  authorization is terminated  or revoked sooner.       Influenza A by PCR NEGATIVE NEGATIVE Final   Influenza B by PCR NEGATIVE NEGATIVE Final    Comment: (NOTE) The Xpert Xpress SARS-CoV-2/FLU/RSV plus assay is intended as an aid in the diagnosis of influenza from Nasopharyngeal swab specimens and should not be used as a sole basis for treatment. Nasal washings and aspirates are unacceptable for Xpert Xpress SARS-CoV-2/FLU/RSV testing.  Fact Sheet for Patients: BloggerCourse.com  Fact Sheet for Healthcare Providers: SeriousBroker.it  This test is not yet approved or cleared by the [place] FDA and has been authorized for detection and/or diagnosis of SARS-CoV-2 by FDA under an Emergency Use Authorization (EUA). This EUA will remain in effect (meaning this test can be used) for the duration of the COVID-19 declaration under Section 564(b)(1) of the Act, 21 U.S.C. section 360bbb-3(b)(1), unless the authorization is terminated or revoked.  Performed at [facility], [address], [place], [place] [address]          Radiology Studies: No results found.      Scheduled Meds:  apixaban  2.5 mg Oral BID   donepezil  5 mg Oral Daily   insulin aspart  0-5 Units Subcutaneous QHS   insulin aspart  0-9 Units Subcutaneous TID WC   metoprolol tartrate  12.5 mg Oral BID   senna-docusate  2 tablet Oral QHS   Continuous Infusions:   LOS: 6 days    Time spent: 25 minutes    Alberteen Sam, MD [facility] 10/25/2020, 4:34 PM     Please page though AMION or Epic secure chat:  For [employer], Higher education careers adviser

## 2020-10-25 NOTE — Evaluation (Signed)
Occupational Therapy Evaluation Patient Details Name: [name] MRN: [id number] DOB: May 18, 1928 Today's Date: 10/25/2020    History of Present Illness 87 yo female admitted 10/19/20 post fall at home.Work-up reveals R hip periprosthetic fracture, determined non surgical, WBAT.  PMHx:  PAF, dementia, CKD stage 3, essential HTN, DM type II, R THA 2005.   Clinical Impression   Pt presents with decline in function and safety with ADLs and ADL mobility with impaired strength, balance, endurance and hx of dementia. PTA pt lived at home with family and reports that she was Ind with dressing, grooming, toileting ad used a RW for mobility, assist with showers. Pt is a poor historian and uncertain of accuracy of the info she provided. Pt also stated "I shouldn't be in hospital, my broken hip was from years ago not recently even though I did fall". Pt currently requires min A with bed mobility to sit EOB, mi guard A with UB ADLs/grooming, max - total A with LB ADLs, total A with toileting tasks and mod A with mobility using RW. Pt would benefit from acute OT services to address impairments to maximize level of function and safety    Follow Up Recommendations  SNF (if she will have adequate physical support, recommend HH OT)    Equipment Recommendations  3 in 1 bedside commode;Tub/shower seat;Other (comment) (RW)    Recommendations for Other Services       Precautions / Restrictions Precautions Precautions: Fall Precaution Comments: no history to indicate if hip previously had precautions Restrictions Weight Bearing Restrictions: Yes RLE Weight Bearing: Weight bearing as tolerated      Mobility Bed Mobility Overal bed mobility: Needs Assistance Bed Mobility: Supine to Sit     Supine to sit: Supervision     General bed mobility comments: Increased time and effort but able to scoot to move and scoot to edge of bed unassisted.    Transfers Overall transfer level: Needs  assistance Equipment used: Rolling walker (2 wheeled)   Sit to Stand: Mod assist         General transfer comment: Cues for hand placement to and from seated surface    Balance Overall balance assessment: History of Falls;Needs assistance Sitting-balance support: Feet supported;No upper extremity supported Sitting balance-Leahy Scale: Fair     Standing balance support: Bilateral upper extremity supported;During functional activity Standing balance-Leahy Scale: Poor Standing balance comment: Pt stood at RW for per hygiene after BM                           ADL either performed or assessed with clinical judgement   ADL Overall ADL's : Needs assistance/impaired Eating/Feeding: Set up;Independent;Sitting   Grooming: Wash/dry hands;Wash/dry face;Min guard;Sitting   Upper Body Bathing: Min guard;Sitting   Lower Body Bathing: Maximal assistance   Upper Body Dressing : Min guard   Lower Body Dressing: Total assistance   Toilet Transfer: Moderate assistance;RW;Stand-pivot;BSC;Cueing for safety;Cueing for sequencing   Toileting- Clothing Manipulation and Hygiene: Total assistance       Functional mobility during ADLs: Moderate assistance;Rolling walker;Cueing for safety General ADL Comments: Pt soiled in BM once initiating sitting EOB (unaware that she had BM), required total A for clean up standing at RW     Vision Baseline Vision/History: Wears glasses Patient Visual Report: No change from baseline       Perception     Praxis      Pertinent Vitals/Pain Pain Assessment: No/denies pain Faces Pain  Scale: Hurts little more Pain Location: R hip with mobility Pain Descriptors / Indicators: Guarding;Grimacing Pain Intervention(s): Monitored during session;Repositioned;Limited activity within patient's tolerance     Hand Dominance Right   Extremity/Trunk Assessment Upper Extremity Assessment Upper Extremity Assessment: Generalized weakness   Lower  Extremity Assessment Lower Extremity Assessment: Defer to PT evaluation   Cervical / Trunk Assessment Cervical / Trunk Assessment: Kyphotic   Communication Communication Communication: No difficulties   Cognition Arousal/Alertness: Awake/alert Behavior During Therapy: WFL for tasks assessed/performed Overall Cognitive Status: History of cognitive impairments - at baseline                                     General Comments       Exercises     Shoulder Instructions      Home Living Family/patient expects to be discharged to:: Skilled nursing facility Living Arrangements: Children Available Help at Discharge: Family;Available 24 hours/day Type of Home: House                           Additional Comments: pt is unable to give a reliable history      Prior Functioning/Environment Level of Independence: Needs assistance  Gait / Transfers Assistance Needed: Pt unable to give a history, reports that she lives with her husband, children and dog ADL's / Homemaking Assistance Needed: Pt reports that she has assist only for showers and home mgt            OT Problem List: Decreased strength;Impaired balance (sitting and/or standing);Decreased cognition;Decreased safety awareness;Decreased activity tolerance;Decreased knowledge of use of DME or AE      OT Treatment/Interventions: Self-care/ADL training;Therapeutic exercise;Patient/family education;Balance training;Therapeutic activities;DME and/or AE instruction    OT Goals(Current goals can be found in the care plan section) Acute Rehab OT Goals Patient Stated Goal: to go home to her family and dog OT Goal Formulation: With patient Time For Goal Achievement: 11/08/20 ADL Goals Pt Will Perform Grooming: with supervision;with set-up;sitting Pt Will Perform Upper Body Bathing: with supervision;with set-up;sitting Pt Will Perform Lower Body Bathing: with mod assist;with min assist;sitting/lateral  leans Pt Will Perform Upper Body Dressing: with supervision;with set-up;sitting Pt Will Transfer to Toilet: with min assist;ambulating;bedside commode Pt Will Perform Toileting - Clothing Manipulation and hygiene: with max assist;with mod assist;sit to/from stand  OT Frequency: Min 2X/week   Barriers to D/C:            Co-evaluation              AM-PAC OT "6 Clicks" Daily Activity     Outcome Measure Help from another person eating meals?: None Help from another person taking care of personal grooming?: A Little Help from another person toileting, which includes using toliet, bedpan, or urinal?: A Lot Help from another person bathing (including washing, rinsing, drying)?: A Lot Help from another person to put on and taking off regular upper body clothing?: A Little Help from another person to put on and taking off regular lower body clothing?: A Lot 6 Click Score: 16   End of Session Equipment Utilized During Treatment: Gait belt;Rolling walker;Other (comment) [facility]) Nurse Communication: Mobility status  Activity Tolerance: Patient tolerated treatment well Patient left: in chair;with call bell/phone within reach;with chair alarm set  OT Visit Diagnosis: Unsteadiness on feet (R26.81);Other abnormalities of gait and mobility (R26.89);Muscle weakness (generalized) (M62.81);Other symptoms and signs involving cognitive  function                Time: 7473-4037 OT Time Calculation (min): 25 min Charges:  OT General Charges $OT Visit: 1 Visit OT Evaluation $OT Eval Moderate Complexity: 1 Mod OT Treatments $Self Care/Home Management : 8-22 mins    Galen Manila 10/25/2020, 2:49 PM

## 2020-10-25 NOTE — TOC Progression Note (Addendum)
Transition of Care [facility]) - Progression Note    Patient Details  Name: [name] MRN: [id number] Date of Birth: 24-Dec-1928  Transition of Care [facility]) CM/SW Contact  Hannah Lesches, RN Phone Number: 10/25/2020, 3:26 PM  Clinical Narrative:    NCM and CSW spoke with pt, pt's husband daughter [name] @ bedside and daughter [name] ( in [place]) on speaker phone regarding transitional care needs. Daughters states dad has dementia, back problems and  can't care for pt @ home. NCM explained per MD pt medically ready to transition to next level of care, SNF.  NCM shared PT's evaluation / recommendation : SNF. During hospital course pt adamantly refused SNF placement. Pt is now in agreement to SNF placement after hearing daughter [name] reinforce the need for SNF placement.  Choice offered to pt/family and [facility]  SNF chosen .  Discussed insurance authorization process. Medicare SNF star rating list will be provided to daughter. Patient expressed being hopeful for rehab and to feel better soon. No further questions reported at this time. NCM to continue to follow and assist with discharge planning needs.   10/25/2020 Pt states fully COVID vaccinated plus booster x1.  Expected Discharge Plan: Skilled Nursing Facility Barriers to Discharge: No SNF bed, Insurance Authorization  Expected Discharge Plan and Services Expected Discharge Plan: Skilled Nursing Facility         Expected Discharge Date: 10/25/20               DME Arranged: Dan Humphreys rolling, 3-N-1 DME Agency: [employer] Date DME Agency Contacted: 10/25/20 Time DME Agency Contacted: 1112 Representative spoke with at DME Agency: Velna Hatchet HH Arranged: PT, OT, Nurse's Aide, Social Work [employer] Agency: [facility] Date HH Agency Contacted: 10/25/20 Time HH Agency Contacted: 1112 Representative spoke with at [facility] Agency: Misty Stanley   Social Determinants of Health (SDOH) Interventions    Readmission Risk Interventions No flowsheet data  found.

## 2020-10-25 NOTE — NC FL2 (Signed)
[place] MEDICAID FL2 LEVEL OF CARE SCREENING TOOL     IDENTIFICATION  Patient Name: [name] Birthdate: [birth year] Sex: female Admission Date (Current Location): 10/19/2020  [facility] and [place] Number:  Producer, television/film/video and Address:         Provider Number: [id number]  Attending Physician Name and Address:  Alberteen Sam, *  Relative Name and Phone Number:       Current Level of Care: Hospital Recommended Level of Care: Skilled Nursing Facility Prior Approval Number:    Date Approved/Denied:   PASRR Number: [id number]  Discharge Plan:      Current Diagnoses: Patient Active Problem List   Diagnosis Date Noted   S/P right hip fracture 10/19/2020    Orientation RESPIRATION BLADDER Height & Weight     Self, Time, Situation, Place (hx of dementia)  Normal Continent Weight: 54.4 kg Height:  5' 1.5" (156.2 cm)  BEHAVIORAL SYMPTOMS/MOOD NEUROLOGICAL BOWEL NUTRITION STATUS      Continent Diet (refer to d/c summary)  AMBULATORY STATUS COMMUNICATION OF NEEDS Skin   Extensive Assist Verbally Normal                       Personal Care Assistance Level of Assistance  Bathing, Feeding, Dressing Bathing Assistance: Maximum assistance Feeding assistance: Independent Dressing Assistance: Maximum assistance     Functional Limitations Info  Sight, Hearing, Speech Sight Info: Adequate Hearing Info: Adequate Speech Info: Adequate    SPECIAL CARE FACTORS FREQUENCY  PT (By licensed PT), OT (By licensed OT)     PT Frequency: 5x/week, evaluateand treat OT Frequency: 5x/week, evaluateand treat            Contractures Contractures Info: Present    Additional Factors Info  Allergies, Code Status, Insulin Sliding Scale Code Status Info: Full Code Allergies Info: No Known Allergies   Insulin Sliding Scale Info: novolog 3x a day with meals, refer to d/c summary       Current Medications (10/25/2020):  This is the current hospital active  medication list Current Facility-Administered Medications  Medication Dose Route Frequency Provider Last Rate Last Admin   acetaminophen (TYLENOL) tablet 650 mg  650 mg Oral Q6H PRN Darlin Drop, DO   650 mg at 10/22/20 1007   apixaban (ELIQUIS) tablet 2.5 mg  2.5 mg Oral BID Jamal Collin, RPH   2.5 mg at 10/25/20 0918   donepezil (ARICEPT) tablet 5 mg  5 mg Oral Daily Dow Adolph N, DO   5 mg at 10/25/20 5053   HYDROcodone-acetaminophen (NORCO/VICODIN) 5-325 MG per tablet 1-2 tablet  1-2 tablet Oral Q6H PRN Dow Adolph N, DO       insulin aspart (novoLOG) injection 0-5 Units  0-5 Units Subcutaneous QHS Dow Adolph N, DO   1 Units at 10/20/20 1714   insulin aspart (novoLOG) injection 0-9 Units  0-9 Units Subcutaneous TID WC Dow Adolph N, DO   2 Units at 10/25/20 1429   melatonin tablet 3 mg  3 mg Oral QHS PRN Dow Adolph N, DO   3 mg at 10/23/20 2132   metoprolol tartrate (LOPRESSOR) tablet 12.5 mg  12.5 mg Oral BID Dow Adolph N, DO   12.5 mg at 10/24/20 2214   morphine 2 MG/ML injection 2 mg  2 mg Intravenous Q3H PRN Dow Adolph N, DO       ondansetron (ZOFRAN) injection 4 mg  4 mg Intravenous Q6H PRN Darlin Drop, DO  senna-docusate (Senokot-S) tablet 2 tablet  2 tablet Oral QHS Dow Adolph N, DO   2 tablet at 10/24/20 2214     Discharge Medications: Please see discharge summary for a list of discharge medications.  Relevant Imaging Results:  Relevant Lab Results:   Additional Information SS# [id number]  Epifanio Lesches, RN

## 2020-10-26 DIAGNOSIS — Z8781 Personal history of (healed) traumatic fracture: Secondary | ICD-10-CM | POA: Diagnosis not present

## 2020-10-26 LAB — GLUCOSE, CAPILLARY
Glucose-Capillary: 102 mg/dL — ABNORMAL HIGH (ref 70–99)
Glucose-Capillary: 90 mg/dL (ref 70–99)
Glucose-Capillary: 150 mg/dL — ABNORMAL HIGH (ref 70–99)
Glucose-Capillary: 136 mg/dL — ABNORMAL HIGH (ref 70–99)

## 2020-10-26 NOTE — TOC Progression Note (Signed)
Transition of Care [facility]) - Progression Note    Patient Details  Name: ORETHA WEISMANN MRN: [id number] Date of Birth: 08/12/1928  Transition of Care [facility]) CM/SW Contact  Donnalee Curry, LCSWA Phone Number: 10/26/2020, 11:03 AM  Clinical Narrative:     SW spoke with Pattricia Boss [facility] [phone]) reports auth still pending.  Expected Discharge Plan: Skilled Nursing Facility Barriers to Discharge: Insurance Authorization  Expected Discharge Plan and Services Expected Discharge Plan: Skilled Nursing Facility         Expected Discharge Date: 10/25/20               DME Arranged: Dan Humphreys rolling, 3-N-1 DME Agency: [employer] Date DME Agency Contacted: 10/25/20 Time DME Agency Contacted: 1112 Representative spoke with at DME Agency: Velna Hatchet HH Arranged: PT, OT, Nurse's Aide, Social Work [employer] Agency: [facility] Date HH Agency Contacted: 10/25/20 Time HH Agency Contacted: 1112 Representative spoke with at [facility] Agency: Misty Stanley   Social Determinants of Health (SDOH) Interventions    Readmission Risk Interventions No flowsheet data found.

## 2020-10-26 NOTE — Progress Notes (Addendum)
PROGRESS NOTE  [name] VQQ:[id number] DOB: 04/29/1928 DOA: 10/19/2020 PCP: Hannah Salisbury, MD  HPI/Recap of past 24 hours: This is an 87 year old female with paroxysmal atrial fibrillation on Eliquis, dementia, history of right total hip arthropathy in 2005 who was admitted after a fall the fall resulted in the right periprosthetic hip fracture.  Orthopedic has been consulted and this is not a surgical candidate they advised nonoperative with weightbearing as tolerated and to follow-up in the outpatient orthopedic office in 3 weeks.  PT evaluated patient and recommended SNF TOC has been consulted for SNF.  October 26, 2020: Patient seen and examined at bedside she is sitting in bed and eating breakfast Denies any complaints  Assessment/Plan: Active Problems:   S/P right hip fracture  #1 mechanical fall  2.  Right periprosthetic hip fracture secondary to fall patient is for none surgical management. Continue pain management  3.  Paroxysmal atrial fibrillation continue Eliquis  4.  Chronic kidney disease stage III they. Creatinine is stable and at baseline  5.  Dementia.  Patient had initially refused SNF but her daughters stated that both her father and herself who live together and not able to take care of her so she would have to go to SNF. Continue donepezil  Code Status: full   Severity of Illness: The appropriate patient status for this patient is INPATIENT. Inpatient status is judged to be reasonable and necessary in order to provide the required intensity of service to ensure the patient's safety. The patient's presenting symptoms, physical exam findings, and initial radiographic and laboratory data in the context of their chronic comorbidities is felt to place them at high risk for further clinical deterioration. Furthermore, it is not anticipated that the patient will be medically stable for discharge from the hospital within 2 midnights of admission. The following  factors support the patient status of inpatient.    * I certify that at the point of admission it is my clinical judgment that the patient will require inpatient hospital care spanning beyond 2 midnights from the point of admission due to high intensity of service, high risk for further deterioration and high frequency of surveillance required.*  none  Disposition Plan: snf   Consultants: Orthopedic  Procedures: None  Antimicrobials: None  DVT prophylaxis: Apixaban   Objective: Vitals:   10/25/20 0918 10/25/20 1725 10/25/20 2103 10/26/20 0745  BP: (!) 121/47 128/74 (!) 131/49 (!) 151/64  Pulse: 70 74 64 (!) 55  Resp:  16 16 17   Temp: 97.7 F (36.5 C) 98.6 F (37 C) 98.4 F (36.9 C) 97.6 F (36.4 C)  TempSrc: Oral Oral Oral Oral  SpO2: 99% 100% 100% 92%  Weight:      Height:        Intake/Output Summary (Last 24 hours) at 10/26/2020 0845 Last data filed at 10/26/2020 0500 Gross per 24 hour  Intake --  Output 500 ml  Net -500 ml   Filed Weights   10/19/20 1932 10/19/20 2000  Weight: 54.4 kg 54.4 kg   Body mass index is 22.29 kg/m.  Exam:  General: 87 y.o. year-old female well developed well nourished in no acute distress.  Alert and oriented x3. Cardiovascular: Regular rate and rhythm with no rubs or gallops.  No thyromegaly or JVD noted.   Respiratory: Clear to auscultation with no wheezes or rales. Good inspiratory effort. Abdomen: Soft nontender nondistended with normal bowel sounds x4 quadrants. Musculoskeletal: No lower extremity edema. 2/4 pulses in all 4  extremities. Skin: No ulcerative lesions noted or rashes, Psychiatry: Mood is appropriate for condition and setting    Data Reviewed: CBC: Recent Labs  Lab 10/19/20 1947 10/20/20 0029 10/21/20 0142 10/22/20 0706 10/23/20 0416  WBC 4.7 6.9 5.4 5.4 4.6  NEUTROABS 2.8  --   --   --   --   HGB 10.9* 10.9* 9.8* 12.0 10.3*  HCT 32.4* 31.8* 29.5* 35.6* 31.1*  MCV 95.3 94.6 95.8 93.2 95.1  PLT  142* 133* 111* 104* 126*   Basic Metabolic Panel: Recent Labs  Lab 10/19/20 1947 10/20/20 0029 10/21/20 0142 10/22/20 0706 10/23/20 0416  NA 137 141 137 138 137  K 3.9 3.9 3.9 3.7 4.1  CL 105 107 104 105 106  CO2 24 27 26 25 25   GLUCOSE 139* 119* 91 120* 122*  BUN 25* 21 19 14 19   CREATININE 1.20* 1.04* 0.91 0.95 1.03*  CALCIUM 9.2 9.4 8.8* 8.7* 8.8*  MG  --  1.9  --   --   --   PHOS  --  4.0  --   --   --    GFR: Estimated Creatinine Clearance: 28.6 mL/min (A) (by C-G formula based on SCr of 1.03 mg/dL (H)). Liver Function Tests: No results for input(s): AST, ALT, ALKPHOS, BILITOT, PROT, ALBUMIN in the last 168 hours. No results for input(s): LIPASE, AMYLASE in the last 168 hours. No results for input(s): AMMONIA in the last 168 hours. Coagulation Profile: No results for input(s): INR, PROTIME in the last 168 hours. Cardiac Enzymes: No results for input(s): CKTOTAL, CKMB, CKMBINDEX, TROPONINI in the last 168 hours. BNP (last 3 results) No results for input(s): PROBNP in the last 8760 hours. HbA1C: No results for input(s): HGBA1C in the last 72 hours. CBG: Recent Labs  Lab 10/25/20 0628 10/25/20 1054 10/25/20 1649 10/25/20 2139 10/26/20 0626  GLUCAP 113* 190* 110* 106* 102*   Lipid Profile: No results for input(s): CHOL, HDL, LDLCALC, TRIG, CHOLHDL, LDLDIRECT in the last 72 hours. Thyroid Function Tests: No results for input(s): TSH, T4TOTAL, FREET4, T3FREE, THYROIDAB in the last 72 hours. Anemia Panel: No results for input(s): VITAMINB12, FOLATE, FERRITIN, TIBC, IRON, RETICCTPCT in the last 72 hours. Urine analysis: No results found for: COLORURINE, APPEARANCEUR, LABSPEC, PHURINE, GLUCOSEU, HGBUR, BILIRUBINUR, KETONESUR, PROTEINUR, UROBILINOGEN, NITRITE, LEUKOCYTESUR Sepsis Labs: @LABRCNTIP (procalcitonin:4,lacticidven:4)  ) Recent Results (from the past 240 hour(s))  Resp Panel by RT-PCR (Flu A&B, Covid) Nasopharyngeal Swab     Status: None   Collection  Time: 10/19/20  9:59 PM   Specimen: Nasopharyngeal Swab; Nasopharyngeal(NP) swabs in vial transport medium  Result Value Ref Range Status   SARS Coronavirus 2 by RT PCR NEGATIVE NEGATIVE Final    Comment: (NOTE) SARS-CoV-2 target nucleic acids are NOT DETECTED.  The SARS-CoV-2 RNA is generally detectable in upper respiratory specimens during the acute phase of infection. The lowest concentration of SARS-CoV-2 viral copies this assay can detect is 138 copies/mL. A negative result does not preclude SARS-Cov-2 infection and should not be used as the sole basis for treatment or other patient management decisions. A negative result may occur with  improper specimen collection/handling, submission of specimen other than nasopharyngeal swab, presence of viral mutation(s) within the areas targeted by this assay, and inadequate number of viral copies(<138 copies/mL). A negative result must be combined with clinical observations, patient history, and epidemiological information. The expected result is Negative.  Fact Sheet for Patients:  10/28/20  Fact Sheet for Healthcare Providers:   This test  is no t yet approved or cleared by the [place] and  has been authorized for detection and/or diagnosis of SARS-CoV-2 by FDA under an Emergency Use Authorization (EUA). This EUA will remain  in effect (meaning this test can be used) for the duration of the COVID-19 declaration under Section 564(b)(1) of the Act, 21 U.S.C.section 360bbb-3(b)(1), unless the authorization is terminated  or revoked sooner.       Influenza A by PCR NEGATIVE NEGATIVE Final   Influenza B by PCR NEGATIVE NEGATIVE Final    Comment: (NOTE) The Xpert Xpress SARS-CoV-2/FLU/RSV plus assay is intended as an aid in the diagnosis of influenza from Nasopharyngeal swab specimens and should not be used as a sole basis for treatment. Nasal washings  and aspirates are unacceptable for Xpert Xpress SARS-CoV-2/FLU/RSV testing.  Fact Sheet for Patients: BloggerCourse.com  Fact Sheet for Healthcare Providers: SeriousBroker.it  This test is not yet approved or cleared by the [place] FDA and has been authorized for detection and/or diagnosis of SARS-CoV-2 by FDA under an Emergency Use Authorization (EUA). This EUA will remain in effect (meaning this test can be used) for the duration of the COVID-19 declaration under Section 564(b)(1) of the Act, 21 U.S.C. section 360bbb-3(b)(1), unless the authorization is terminated or revoked.  Performed at [facility], [address] [place], [place] [address]       Studies: No results found.  Scheduled Meds:  apixaban  2.5 mg Oral BID   donepezil  5 mg Oral Daily   insulin aspart  0-5 Units Subcutaneous QHS   insulin aspart  0-9 Units Subcutaneous TID WC   metoprolol tartrate  12.5 mg Oral BID   senna-docusate  2 tablet Oral QHS    Continuous Infusions:   LOS: 7 days     Myrtie Neither, MD [facility]  To reach me or the doctor on call, go to: [email or web address] Password [facility]  10/26/2020, 8:45 AM

## 2020-10-27 DIAGNOSIS — Z8781 Personal history of (healed) traumatic fracture: Secondary | ICD-10-CM | POA: Diagnosis not present

## 2020-10-27 LAB — GLUCOSE, CAPILLARY
Glucose-Capillary: 103 mg/dL — ABNORMAL HIGH (ref 70–99)
Glucose-Capillary: 113 mg/dL — ABNORMAL HIGH (ref 70–99)
Glucose-Capillary: 141 mg/dL — ABNORMAL HIGH (ref 70–99)
Glucose-Capillary: 117 mg/dL — ABNORMAL HIGH (ref 70–99)

## 2020-10-27 NOTE — Plan of Care (Signed)
°  Problem: Activity: °Goal: Ability to avoid complications of mobility impairment will improve °Outcome: Progressing °Goal: Ability to tolerate increased activity will improve °Outcome: Progressing °  °Problem: Education: °Goal: Verbalization of understanding the information provided will improve °Outcome: Progressing °  °

## 2020-10-27 NOTE — TOC Progression Note (Signed)
Transition of Care [facility]) - Progression Note    Patient Details  Name: [name] MRN: [id number] Date of Birth: [birth year]  Transition of Care [facility]) CM/SW Contact  Donnalee Curry, LCSWA Phone Number: 10/27/2020, 9:33 AM  Clinical Narrative:     SW spoke with Pattricia Boss [facility] [phone]) reports auth still pending.  Expected Discharge Plan: Skilled Nursing Facility Barriers to Discharge: Insurance Authorization  Expected Discharge Plan and Services Expected Discharge Plan: Skilled Nursing Facility         Expected Discharge Date: 10/25/20               DME Arranged: Dan Humphreys rolling, 3-N-1 DME Agency: [employer] Date DME Agency Contacted: 10/25/20 Time DME Agency Contacted: 1112 Representative spoke with at DME Agency: Velna Hatchet HH Arranged: PT, OT, Nurse's Aide, Social Work [employer] Agency: [facility] Date HH Agency Contacted: 10/25/20 Time HH Agency Contacted: 1112 Representative spoke with at [facility] Agency: Misty Stanley   Social Determinants of Health (SDOH) Interventions    Readmission Risk Interventions No flowsheet data found.

## 2020-10-27 NOTE — Progress Notes (Signed)
PROGRESS NOTE  [name] JWJ:[id number] DOB: 22-Oct-1928 DOA: 10/19/2020 PCP: Nelwyn Salisbury, MD  HPI/Recap of past 24 hours: This is an 87 year old female with paroxysmal atrial fibrillation on Eliquis, dementia, history of right total hip arthropathy in 2005 who was admitted after a fall the fall resulted in the right periprosthetic hip fracture.  Orthopedic has been consulted and this is not a surgical candidate they advised nonoperative with weightbearing as tolerated and to follow-up in the outpatient orthopedic office in 3 weeks.  PT evaluated patient and recommended SNF TOC has been consulted for SNF.  October 27, 2020: Patient seen and examined at bedside Eating lunch denies any new complain.  Patient stated somebody has told her up anything about what is going on but that she would do what she needs to do I advised her that I saw her yesterday and we discussed about the plan for nursing home placement conversation was not quite coherent she seemed to be answering differently and repeating herself  Assessment/Plan: Active Problems:   S/P right hip fracture  #1 mechanical fall  2.  Right periprosthetic hip fracture secondary to fall patient is for none surgical management. Continue pain management  3.  Paroxysmal atrial fibrillation continue Eliquis  4.  Chronic kidney disease stage III they. Creatinine is stable and at baseline  5.  Dementia.  Patient had initially refused SNF but her daughters stated that both her father and herself who live together and not able to take care of her so she would have to go to SNF. Continue donepezil  Code Status: full   Severity of Illness: The appropriate patient status for this patient is INPATIENT. Inpatient status is judged to be reasonable and necessary in order to provide the required intensity of service to ensure the patient's safety. The patient's presenting symptoms, physical exam findings, and initial radiographic and laboratory  data in the context of their chronic comorbidities is felt to place them at high risk for further clinical deterioration. Furthermore, it is not anticipated that the patient will be medically stable for discharge from the hospital within 2 midnights of admission. The following factors support the patient status of inpatient.    * I certify that at the point of admission it is my clinical judgment that the patient will require inpatient hospital care spanning beyond 2 midnights from the point of admission due to high intensity of service, high risk for further deterioration and high frequency of surveillance required.*  none  Disposition Plan: Waiting for SNF, auth still pending.     Consultants: Orthopedic  Procedures: None  Antimicrobials: None  DVT prophylaxis: Apixaban   Objective: Vitals:   10/26/20 1045 10/26/20 1629 10/26/20 2054 10/27/20 0743  BP:  (!) 117/56 122/61 (!) 144/60  Pulse: 70 (!) 59 (!) 57 (!) 57  Resp:  17 16   Temp:  98.3 F (36.8 C) 98.5 F (36.9 C) 97.7 F (36.5 C)  TempSrc:  Oral Oral Oral  SpO2:  100% 90% 100%  Weight:      Height:        Intake/Output Summary (Last 24 hours) at 10/27/2020 0941 Last data filed at 10/27/2020 0700 Gross per 24 hour  Intake --  Output 1050 ml  Net -1050 ml    Filed Weights   10/19/20 1932 10/19/20 2000  Weight: 54.4 kg 54.4 kg   Body mass index is 22.29 kg/m.  Exam:  General: 87 y.o. year-old female well developed well nourished in no  acute distress.  Alert and oriented x3.  Pleasant Cardiovascular: Regular rate and rhythm with no rubs or gallops.  No thyromegaly or JVD noted.   Respiratory: Clear to auscultation with no wheezes or rales. Good inspiratory effort. Abdomen: Soft nontender nondistended with normal bowel sounds x4 quadrants. Musculoskeletal: No lower extremity edema. 2/4 pulses in all 4 extremities. Skin: No ulcerative lesions noted or rashes, Psychiatry: Mood is appropriate for condition  and setting    Data Reviewed: CBC: Recent Labs  Lab 10/21/20 0142 10/22/20 0706 10/23/20 0416  WBC 5.4 5.4 4.6  HGB 9.8* 12.0 10.3*  HCT 29.5* 35.6* 31.1*  MCV 95.8 93.2 95.1  PLT 111* 104* 126*    Basic Metabolic Panel: Recent Labs  Lab 10/21/20 0142 10/22/20 0706 10/23/20 0416  NA 137 138 137  K 3.9 3.7 4.1  CL 104 105 106  CO2 26 25 25   GLUCOSE 91 120* 122*  BUN 19 14 19   CREATININE 0.91 0.95 1.03*  CALCIUM 8.8* 8.7* 8.8*    GFR: Estimated Creatinine Clearance: 28.6 mL/min (A) (by C-G formula based on SCr of 1.03 mg/dL (H)). Liver Function Tests: No results for input(s): AST, ALT, ALKPHOS, BILITOT, PROT, ALBUMIN in the last 168 hours. No results for input(s): LIPASE, AMYLASE in the last 168 hours. No results for input(s): AMMONIA in the last 168 hours. Coagulation Profile: No results for input(s): INR, PROTIME in the last 168 hours. Cardiac Enzymes: No results for input(s): CKTOTAL, CKMB, CKMBINDEX, TROPONINI in the last 168 hours. BNP (last 3 results) No results for input(s): PROBNP in the last 8760 hours. HbA1C: No results for input(s): HGBA1C in the last 72 hours. CBG: Recent Labs  Lab 10/26/20 0626 10/26/20 1122 10/26/20 1629 10/26/20 2056 10/27/20 0636  GLUCAP 102* 90 150* 136* 103*    Lipid Profile: No results for input(s): CHOL, HDL, LDLCALC, TRIG, CHOLHDL, LDLDIRECT in the last 72 hours. Thyroid Function Tests: No results for input(s): TSH, T4TOTAL, FREET4, T3FREE, THYROIDAB in the last 72 hours. Anemia Panel: No results for input(s): VITAMINB12, FOLATE, FERRITIN, TIBC, IRON, RETICCTPCT in the last 72 hours. Urine analysis: No results found for: COLORURINE, APPEARANCEUR, LABSPEC, PHURINE, GLUCOSEU, HGBUR, BILIRUBINUR, KETONESUR, PROTEINUR, UROBILINOGEN, NITRITE, LEUKOCYTESUR Sepsis Labs: @LABRCNTIP (procalcitonin:4,lacticidven:4)  ) Recent Results (from the past 240 hour(s))  Resp Panel by RT-PCR (Flu A&B, Covid) Nasopharyngeal Swab      Status: None   Collection Time: 10/19/20  9:59 PM   Specimen: Nasopharyngeal Swab; Nasopharyngeal(NP) swabs in vial transport medium  Result Value Ref Range Status   SARS Coronavirus 2 by RT PCR NEGATIVE NEGATIVE Final    Comment: (NOTE) SARS-CoV-2 target nucleic acids are NOT DETECTED.  The SARS-CoV-2 RNA is generally detectable in upper respiratory specimens during the acute phase of infection. The lowest concentration of SARS-CoV-2 viral copies this assay can detect is 138 copies/mL. A negative result does not preclude SARS-Cov-2 infection and should not be used as the sole basis for treatment or other patient management decisions. A negative result may occur with  improper specimen collection/handling, submission of specimen other than nasopharyngeal swab, presence of viral mutation(s) within the areas targeted by this assay, and inadequate number of viral copies(<138 copies/mL). A negative result must be combined with clinical observations, patient history, and epidemiological information. The expected result is Negative.  Fact Sheet for Patients:  10/29/20  Fact Sheet for Healthcare Providers:   This test is no t yet approved or cleared by the 12/20/20 and  has been authorized  for detection and/or diagnosis of SARS-CoV-2 by FDA under an Emergency Use Authorization (EUA). This EUA will remain  in effect (meaning this test can be used) for the duration of the COVID-19 declaration under Section 564(b)(1) of the Act, 21 U.S.C.section 360bbb-3(b)(1), unless the authorization is terminated  or revoked sooner.       Influenza A by PCR NEGATIVE NEGATIVE Final   Influenza B by PCR NEGATIVE NEGATIVE Final    Comment: (NOTE) The Xpert Xpress SARS-CoV-2/FLU/RSV plus assay is intended as an aid in the diagnosis of influenza from Nasopharyngeal swab specimens and should not be used as a sole basis  for treatment. Nasal washings and aspirates are unacceptable for Xpert Xpress SARS-CoV-2/FLU/RSV testing.  Fact Sheet for Patients: BloggerCourse.com  Fact Sheet for Healthcare Providers: SeriousBroker.it  This test is not yet approved or cleared by the [place] FDA and has been authorized for detection and/or diagnosis of SARS-CoV-2 by FDA under an Emergency Use Authorization (EUA). This EUA will remain in effect (meaning this test can be used) for the duration of the COVID-19 declaration under Section 564(b)(1) of the Act, 21 U.S.C. section 360bbb-3(b)(1), unless the authorization is terminated or revoked.  Performed at [facility], [address] [place], [place] [address]        Studies: No results found.  Scheduled Meds:  apixaban  2.5 mg Oral BID   donepezil  5 mg Oral Daily   insulin aspart  0-5 Units Subcutaneous QHS   insulin aspart  0-9 Units Subcutaneous TID WC   metoprolol tartrate  12.5 mg Oral BID   senna-docusate  2 tablet Oral QHS    Continuous Infusions:   LOS: 8 days     Myrtie Neither, MD [facility]  To reach me or the doctor on call, go to: [email or web address] Password [facility]  10/27/2020, 9:41 AM

## 2020-10-27 NOTE — Plan of Care (Signed)

## 2020-10-28 ENCOUNTER — Encounter: Payer: Self-pay | Admitting: Neurology

## 2020-10-28 DIAGNOSIS — F0390 Unspecified dementia without behavioral disturbance: Secondary | ICD-10-CM | POA: Diagnosis not present

## 2020-10-28 DIAGNOSIS — I10 Essential (primary) hypertension: Secondary | ICD-10-CM | POA: Diagnosis not present

## 2020-10-28 DIAGNOSIS — R41841 Cognitive communication deficit: Secondary | ICD-10-CM | POA: Diagnosis not present

## 2020-10-28 DIAGNOSIS — M25551 Pain in right hip: Secondary | ICD-10-CM | POA: Diagnosis not present

## 2020-10-28 DIAGNOSIS — N1831 Chronic kidney disease, stage 3a: Secondary | ICD-10-CM | POA: Diagnosis not present

## 2020-10-28 DIAGNOSIS — E46 Unspecified protein-calorie malnutrition: Secondary | ICD-10-CM | POA: Diagnosis not present

## 2020-10-28 DIAGNOSIS — Z7401 Bed confinement status: Secondary | ICD-10-CM | POA: Diagnosis not present

## 2020-10-28 DIAGNOSIS — I480 Paroxysmal atrial fibrillation: Secondary | ICD-10-CM | POA: Diagnosis not present

## 2020-10-28 DIAGNOSIS — W19XXXD Unspecified fall, subsequent encounter: Secondary | ICD-10-CM | POA: Diagnosis not present

## 2020-10-28 DIAGNOSIS — M6281 Muscle weakness (generalized): Secondary | ICD-10-CM | POA: Diagnosis not present

## 2020-10-28 DIAGNOSIS — M9701XD Periprosthetic fracture around internal prosthetic right hip joint, subsequent encounter: Secondary | ICD-10-CM | POA: Diagnosis not present

## 2020-10-28 DIAGNOSIS — R531 Weakness: Secondary | ICD-10-CM | POA: Diagnosis not present

## 2020-10-28 DIAGNOSIS — Z8781 Personal history of (healed) traumatic fracture: Secondary | ICD-10-CM | POA: Diagnosis not present

## 2020-10-28 DIAGNOSIS — S72001A Fracture of unspecified part of neck of right femur, initial encounter for closed fracture: Secondary | ICD-10-CM | POA: Diagnosis not present

## 2020-10-28 DIAGNOSIS — R2689 Other abnormalities of gait and mobility: Secondary | ICD-10-CM | POA: Diagnosis not present

## 2020-10-28 DIAGNOSIS — M2550 Pain in unspecified joint: Secondary | ICD-10-CM | POA: Diagnosis not present

## 2020-10-28 DIAGNOSIS — S72001D Fracture of unspecified part of neck of right femur, subsequent encounter for closed fracture with routine healing: Secondary | ICD-10-CM | POA: Diagnosis not present

## 2020-10-28 LAB — GLUCOSE, CAPILLARY
Glucose-Capillary: 96 mg/dL (ref 70–99)
Glucose-Capillary: 180 mg/dL — ABNORMAL HIGH (ref 70–99)

## 2020-10-28 LAB — RESP PANEL BY RT-PCR (FLU A&B, COVID) ARPGX2
Influenza A by PCR: NEGATIVE
Influenza B by PCR: NEGATIVE
SARS Coronavirus 2 by RT PCR: NEGATIVE

## 2020-10-28 NOTE — Progress Notes (Signed)
Occupational Therapy Treatment Patient Details Name: [name] MRN: [id number] DOB: 01-16-1929 Today's Date: 10/28/2020    History of present illness 87 yo female admitted 10/19/20 post fall at home.Work-up reveals R hip periprosthetic fracture, determined non surgical, WBAT.  PMHx:  PAF, dementia, CKD stage 3, essential HTN, DM type II, R THA 2005.   OT comments  Pt in recliner upon arrival. Session focused on standing from recliner to RW for transfers to [facility], toileting tasks, hygiene and UB dressing seated in recliner. OT will continue to follow acutely to maximize level of function and safety  Follow Up Recommendations  SNF    Equipment Recommendations  3 in 1 bedside commode;Tub/shower seat;Other (comment) (RW)    Recommendations for Other Services      Precautions / Restrictions Precautions Precautions: Fall Precaution Comments: no history to indicate if hip previously had precautions Restrictions Weight Bearing Restrictions: Yes RLE Weight Bearing: Weight bearing as tolerated       Mobility Bed Mobility Overal bed mobility: Needs Assistance Bed Mobility: Supine to Sit     Supine to sit: Min assist     General bed mobility comments: pt in recliner upon arrival    Transfers Overall transfer level: Needs assistance Equipment used: Rolling walker (2 wheeled) Transfers: Sit to/from Stand Sit to Stand: Mod assist         General transfer comment: Pt continues to required cues for hand placement and assistance to correct and maintain balance.    Balance Overall balance assessment: History of Falls;Needs assistance Sitting-balance support: Feet supported;No upper extremity supported Sitting balance-Leahy Scale: Fair     Standing balance support: Bilateral upper extremity supported;During functional activity Standing balance-Leahy Scale: Poor Standing balance comment: Pt stood at RW for pericare and presents with posterior lean and mod assistance to  maintain balance.                           ADL either performed or assessed with clinical judgement   ADL Overall ADL's : Needs assistance/impaired Eating/Feeding: Set up;Independent;Sitting   Grooming: Wash/dry hands;Wash/dry face;Min guard;Sitting           Upper Body Dressing : Printmaker: Moderate assistance;RW;Stand-pivot;BSC;Cueing for safety;Cueing for sequencing   Toileting- Clothing Manipulation and Hygiene: Total assistance       Functional mobility during ADLs: Moderate assistance;Rolling walker;Cueing for safety General ADL Comments: Pt soiled in BM once standing from recliner (unaware that she had BM), required total A for clean up standing at RW     Vision Baseline Vision/History: Wears glasses Patient Visual Report: No change from baseline     Perception     Praxis      Cognition Arousal/Alertness: Awake/alert Behavior During Therapy: WFL for tasks assessed/performed Overall Cognitive Status: History of cognitive impairments - at baseline                                          Exercises     Shoulder Instructions       General Comments      Pertinent Vitals/ Pain       Pain Assessment: Faces Faces Pain Scale: Hurts little more Pain Location: R hip with mobility Pain Descriptors / Indicators: Guarding;Grimacing Pain Intervention(s): Monitored during session;Repositioned  Home Living  Prior Functioning/Environment              Frequency  Min 2X/week        Progress Toward Goals  OT Goals(current goals can now be found in the care plan section)     Acute Rehab OT Goals Patient Stated Goal: Per family to go to rehab  Plan Discharge plan remains appropriate    Co-evaluation                 AM-PAC OT "6 Clicks" Daily Activity     Outcome Measure   Help from another person eating meals?: None Help  from another person taking care of personal grooming?: A Little Help from another person toileting, which includes using toliet, bedpan, or urinal?: A Lot Help from another person bathing (including washing, rinsing, drying)?: A Lot Help from another person to put on and taking off regular upper body clothing?: A Little Help from another person to put on and taking off regular lower body clothing?: A Lot 6 Click Score: 16    End of Session Equipment Utilized During Treatment: Gait belt;Rolling walker;Other (comment) (BSC)  OT Visit Diagnosis: Unsteadiness on feet (R26.81);Other abnormalities of gait and mobility (R26.89);Muscle weakness (generalized) (M62.81);Other symptoms and signs involving cognitive function   Activity Tolerance Patient tolerated treatment well   Patient Left in chair;with call bell/phone within reach;with chair alarm set   Nurse Communication          Time: [phone] OT Time Calculation (min): 28 min  Charges: OT General Charges $OT Visit: 1 Visit OT Treatments $Self Care/Home Management : 8-22 mins $Therapeutic Activity: 8-22 mins     Galen Manila 10/28/2020, 3:03 PM

## 2020-10-28 NOTE — Plan of Care (Addendum)
Patient discharged out, patient stable, patient belongings sent with patient (including wheelchair), will call the family to com get BSC d/t PTAR not able to take it with them, IV removed.   Problem: Activity: Goal: Ability to avoid complications of mobility impairment will improve Outcome: Adequate for Discharge Goal: Ability to tolerate increased activity will improve Outcome: Adequate for Discharge   Problem: Education: Goal: Verbalization of understanding the information provided will improve Outcome: Adequate for Discharge   Problem: Coping: Goal: Level of anxiety will decrease Outcome: Adequate for Discharge   Problem: Physical Regulation: Goal: Postoperative complications will be avoided or minimized Outcome: Adequate for Discharge   Problem: Respiratory: Goal: Ability to maintain a clear airway will improve Outcome: Adequate for Discharge   Problem: Pain Management: Goal: Pain level will decrease Outcome: Adequate for Discharge   Problem: Skin Integrity: Goal: Signs of wound healing will improve Outcome: Adequate for Discharge   Problem: Tissue Perfusion: Goal: Ability to maintain adequate tissue perfusion will improve Outcome: Adequate for Discharge   Problem: Education: Goal: Knowledge of General Education information will improve Description: Including pain rating scale, medication(s)/side effects and non-pharmacologic comfort measures 10/28/2020 1406 by Darleene Cleaver, RN Outcome: Adequate for Discharge 10/28/2020 1041 by Darleene Cleaver, RN Outcome: Progressing   Problem: Health Behavior/Discharge Planning: Goal: Ability to manage health-related needs will improve Outcome: Adequate for Discharge   Problem: Clinical Measurements: Goal: Ability to maintain clinical measurements within normal limits will improve Outcome: Adequate for Discharge Goal: Will remain free from infection Outcome: Adequate for Discharge Goal: Diagnostic test results will  improve Outcome: Adequate for Discharge Goal: Respiratory complications will improve Outcome: Adequate for Discharge Goal: Cardiovascular complication will be avoided Outcome: Adequate for Discharge   Problem: Activity: Goal: Risk for activity intolerance will decrease 10/28/2020 1406 by Darleene Cleaver, RN Outcome: Adequate for Discharge 10/28/2020 1041 by Darleene Cleaver, RN Outcome: Progressing   Problem: Nutrition: Goal: Adequate nutrition will be maintained Outcome: Adequate for Discharge   Problem: Coping: Goal: Level of anxiety will decrease Outcome: Adequate for Discharge   Problem: Elimination: Goal: Will not experience complications related to bowel motility Outcome: Adequate for Discharge Goal: Will not experience complications related to urinary retention Outcome: Adequate for Discharge   Problem: Pain Managment: Goal: General experience of comfort will improve 10/28/2020 1406 by Darleene Cleaver, RN Outcome: Adequate for Discharge 10/28/2020 1041 by Darleene Cleaver, RN Outcome: Progressing   Problem: Safety: Goal: Ability to remain free from injury will improve 10/28/2020 1406 by Darleene Cleaver, RN Outcome: Adequate for Discharge 10/28/2020 1041 by Darleene Cleaver, RN Outcome: Progressing   Problem: Skin Integrity: Goal: Risk for impaired skin integrity will decrease 10/28/2020 1406 by Darleene Cleaver, RN Outcome: Adequate for Discharge 10/28/2020 1041 by Darleene Cleaver, RN Outcome: Progressing   Problem: Acute Rehab PT Goals(only PT should resolve) Goal: Pt Will Go Supine/Side To Sit Outcome: Adequate for Discharge Goal: Pt Will Transfer Bed To Chair/Chair To Bed Outcome: Adequate for Discharge Goal: Pt Will Perform Standing Balance Or Pre-Gait Outcome: Adequate for Discharge Goal: Pt Will Ambulate Outcome: Adequate for Discharge   Problem: Acute Rehab OT Goals (only OT should resolve) Goal: Pt. Will Perform Grooming Outcome: Adequate for  Discharge Goal: Pt. Will Perform Upper Body Bathing Outcome: Adequate for Discharge Goal: Pt. Will Perform Lower Body Bathing Outcome: Adequate for Discharge Goal: Pt. Will Perform Upper Body Dressing Outcome: Adequate for Discharge Goal: Pt. Will Transfer To Toilet Outcome: Adequate for Discharge  Goal: Pt. Will Perform Toileting-Clothing Manipulation Outcome: Adequate for Discharge

## 2020-10-28 NOTE — Plan of Care (Signed)

## 2020-10-28 NOTE — Progress Notes (Signed)
Physical Therapy Treatment Patient Details Name: [name] MRN: [id number] DOB: 08/20/1928 Today's Date: 10/28/2020    History of Present Illness 87 yo female admitted 10/19/20 post fall at home.Work-up reveals R hip periprosthetic fracture, determined non surgical, WBAT.  PMHx:  PAF, dementia, CKD stage 3, essential HTN, DM type II, R THA 2005.    PT Comments    Pt supine in bed.  Pt agreeable to PT session.  Pt continues to present with posterior bias and need for mod assistance.  Continue to recommend snf placement at this time.     Follow Up Recommendations  SNF     Equipment Recommendations  Rolling walker with 5" wheels;3in1 (PT) (youth height)    Recommendations for Other Services       Precautions / Restrictions Precautions Precautions: Fall Precaution Comments: no history to indicate if hip previously had precautions Restrictions Weight Bearing Restrictions: Yes RLE Weight Bearing: Weight bearing as tolerated    Mobility  Bed Mobility Overal bed mobility: Needs Assistance Bed Mobility: Supine to Sit     Supine to sit: Min assist     General bed mobility comments: Increased time and effort to move to edge of bed.  Min assistance for upper trunk assist.    Transfers Overall transfer level: Needs assistance Equipment used: Rolling walker (2 wheeled) Transfers: Sit to/from Stand Sit to Stand: Mod assist         General transfer comment: Presents with urinary incontinence and posterior lean which required her to return to seated position.  Performed additional transfer for pericare.  Pt continues to required cues for hand placement and assistance to correct and maintain balance.  Ambulation/Gait Ambulation/Gait assistance: Mod assist;Min assist Gait Distance (Feet): 80 Feet Assistive device: Rolling walker (2 wheeled) (youth height) Gait Pattern/deviations: Step-through pattern;Trunk flexed;Drifts right/left;Narrow base of support;Decreased stride  length;Decreased stance time - right;Decreased weight shift to right     General Gait Details: Pt required cues for upper trunk control and forward gaze and to increase BOS.  Continued assistance to negotiate obstacles in halls.  Pt intially required mod assistance but quickly moved to min assistance as tx progressed.   Stairs             Wheelchair Mobility    Modified Rankin (Stroke Patients Only)       Balance Overall balance assessment: History of Falls;Needs assistance Sitting-balance support: Feet supported;No upper extremity supported Sitting balance-Leahy Scale: Fair       Standing balance-Leahy Scale: Poor Standing balance comment: Pt stood at RW for pericare and presents with posterior lean and mod assistance to maintain balance.                            Cognition Arousal/Alertness: Awake/alert Behavior During Therapy: WFL for tasks assessed/performed Overall Cognitive Status: History of cognitive impairments - at baseline                                        Exercises      General Comments        Pertinent Vitals/Pain Pain Assessment: Faces Faces Pain Scale: Hurts little more Pain Location: R hip with mobility Pain Descriptors / Indicators: Guarding;Grimacing Pain Intervention(s): Monitored during session;Repositioned    Home Living  Prior Function            PT Goals (current goals can now be found in the care plan section) Acute Rehab PT Goals Patient Stated Goal: Per family to go to rehab Potential to Achieve Goals: Good Progress towards PT goals: Progressing toward goals    Frequency    Min 3X/week      PT Plan Current plan remains appropriate    Co-evaluation              AM-PAC PT "6 Clicks" Mobility   Outcome Measure  Help needed turning from your back to your side while in a flat bed without using bedrails?: A Little Help needed moving from lying on  your back to sitting on the side of a flat bed without using bedrails?: A Lot Help needed moving to and from a bed to a chair (including a wheelchair)?: A Lot Help needed standing up from a chair using your arms (e.g., wheelchair or bedside chair)?: A Lot Help needed to walk in hospital room?: A Lot Help needed climbing 3-5 steps with a railing? : Total 6 Click Score: 12    End of Session Equipment Utilized During Treatment: Gait belt Activity Tolerance: Patient tolerated treatment well Patient left: in chair;with call bell/phone within reach;with chair alarm set Nurse Communication: Mobility status PT Visit Diagnosis: Unsteadiness on feet (R26.81);Muscle weakness (generalized) (M62.81);History of falling (Z91.81);Pain Pain - Right/Left: Right Pain - part of body: Hip     Time: 9326-7124 PT Time Calculation (min) (ACUTE ONLY): 19 min  Charges:  $Gait Training: 8-22 mins                     Bonney Leitz , PTA Acute Rehabilitation Services Pager [phone] Office [phone]    Sukari Grist Artis Delay 10/28/2020, 12:42 PM

## 2020-10-28 NOTE — TOC Transition Note (Addendum)
Transition of Care [facility]) - CM/SW Discharge Note   Patient Details  Name: [name] MRN: [id number] Date of Birth: 01-14-1929  Transition [facility]) CM/SW Contact:  Epifanio Lesches, RN Phone Number: 10/28/2020, 9:35 AM   Clinical Narrative:    Insurance authorization received for SNF placement,  start  date 7/17- 7/20, 4 day approval, reference  # [id number], Plan auth: [id number]. Artis Dagout CM  Patient will DC to: [facility] Anticipated DC date: 10/28/2020 Family notified: Marlan Palau( daughter), [phone] Transport by: Sharin Mons  Admitted with R hip periprosthetic fracture, determined non surgical, WBAT.  PMHx:  PAF, dementia, CKD 3a, HTN. Per MD patient ready for DC to . RN, patient, patient's family, and facility notified of DC. Discharge Summary and FL2 sent to facility.  Once bed assignment received from [facility] and resulted COVID TEST received RN to call report prior to discharge [phone]).  DC packet on chart. Ambulance transport will be requested once bed assigned and COVID results requested for patient.   RNCM will sign off for now as intervention is no longer needed. Please consult [place] again if new needs arise.     10/28/2020 @ 12:17 pm Transportation services arranged with [facility] for pt's transport to [facility].   Final next level of care: Skilled Nursing Facility Barriers to Discharge: Insurance Authorization   Patient Goals and CMS Choice     Choice offered to / list presented to : Spouse, Patient  Discharge Placement                       Discharge Plan and Services                DME Arranged: Walker rolling, 3-N-1 DME Agency: [employer] Date DME Agency Contacted: 10/25/20 Time DME Agency Contacted: 1112 Representative spoke with at DME Agency: Velna Hatchet HH Arranged: PT, OT, Nurse's Aide, Social Work [employer] Agency: [facility] Date HH Agency Contacted: 10/25/20 Time HH Agency Contacted: 1112 Representative spoke with at [facility] Agency: Misty Stanley  Social  Determinants of Health (SDOH) Interventions     Readmission Risk Interventions No flowsheet data found.

## 2020-10-28 NOTE — Discharge Summary (Signed)
Physician Discharge Summary  [name] XIP:[id number] DOB: 1928/05/16 DOA: 10/19/2020  PCP: Nelwyn Salisbury, MD  Admit date: 10/19/2020 Discharge date: 10/28/2020  Admitted From: home Disposition:  SNF  Recommendations for Outpatient Follow-up:  Follow up with PCP in 1-2 weeks  Home Health: none Equipment/Devices: none  Discharge Condition: stable CODE STATUS: Full code Diet recommendation: regular  HPI: Per admitting MD, [name] is a 87 y.o. female with medical history significant for paroxysmal A. fib on Eliquis, dementia, right total hip arthroplasty in 2005, who presented to [facility] ED after a mechanical fall at home.  She states she slipped on water and fell on her kitchen floor.  She hit her head and landed on her right hip.  No loss of consciousness.  She presented to the ED for further evaluation.  Work-up in the ED revealed right hip periprosthetic fracture.  EDP discussed case with orthopedic surgery Dr. Susa Simmonds.  Admitted for possible orthopedic surgery intervention.  Hospital Course / Discharge diagnoses: Principal Problem Right periprosthetic hip fracture-orthopedic surgery consulted, appreciate input. Seen by Dr. Susa Simmonds on 7/10, and it appears that the implant is stable and she has cortical destruction likely muscular avulsion type injuries. He recommends nonoperative treatment at this point, weightbearing as tolerated. Will need SNF   Active Problems Paroxysmal A. Fib -Continue metoprolol, Eliquis Mild dementia -Continue Aricept CKD 3A -Creatinine last year 1.18, currently at baseline Essential hypertension -continue metoprolol, blood pressure acceptable  Sepsis ruled out   Discharge Instructions  Discharge Instructions     Discharge instructions   Complete by: As directed    From [name]: You were admitted due to a hip fracture around your old hip replacement hardware.  This is mild.  It will heal on it's own You may bear weight  For pain: Take  hydrocodone-acetaminophen as directed on the bottle    Work with therapy to get stronger  Follow up with [facility] in 3 weeks  Resume your other home medicines If you were not previously on metoprolol, start it at 1/2 tablet (12.5 mg) twice daily And Follow up with Dr. Clent Ridges   Increase activity slowly   Complete by: As directed       Allergies as of 10/28/2020   No Known Allergies      Medication List     TAKE these medications    donepezil 5 MG tablet Commonly known as: ARICEPT Take 5 mg by mouth daily.   Eliquis 2.5 MG Tabs tablet Generic drug: apixaban Take 2.5 mg by mouth 2 (two) times daily.   HYDROcodone-acetaminophen 5-325 MG tablet Commonly known as: NORCO/VICODIN Take 1-2 tablets by mouth every 6 (six) hours as needed for moderate pain.   metoprolol tartrate 25 MG tablet Commonly known as: LOPRESSOR Take 0.5 tablets (12.5 mg total) by mouth 2 (two) times daily.               Durable Medical Equipment  (From admission, onward)           Start     Ordered   10/25/20 1111  DME 3-in-1  Once       Comments: Youth height   10/25/20 1114   10/25/20 1111  DME Walker  Once       Question Answer Comment  Walker: With 5 Inch Wheels   Patient needs a walker to treat with the following condition Hip fracture (HCC)      10/25/20 1114   10/25/20 1019  For  home use only DME Walker rolling  Once       Question Answer Comment  Walker: With 5 Inch Wheels   Patient needs a walker to treat with the following condition Weakness      10/25/20 1019            Follow-up Information     Gean Birchwoodowan, Frank, MD. Schedule an appointment as soon as possible for a visit in 3 week(s).   Specialty: Orthopedic Surgery Contact information: [address] [place] [address] [phone]         [facility] Follow up.   Specialty: Home Health Services Why: Home health services will be provided 24 hours post discharge Contact  information: [address] [place] [address] [phone]                 Consultations: Orthopedic surgery   Procedures/Studies:  DG Chest 1 View  Result Date: 10/19/2020 CLINICAL DATA:  Fall EXAM: CHEST  1 VIEW COMPARISON:  Radiograph 06/24/2012 FINDINGS: No visible displaced rib fractures or other acute traumatic abnormality of the chest wall or included shoulders. The osseous structures appear diffusely demineralized which may limit detection of small or nondisplaced fractures. Degenerative changes are present in the imaged spine and shoulders. Low lung volumes with atelectatic changes. Some chronically coarsened interstitial features noted as well. Stable cardiomediastinal contours with a calcified, tortuous aorta. No visible pneumothorax or layering effusion Telemetry leads overlie the chest. IMPRESSION: No visible acute traumatic findings in the chest or included shoulders. Low volumes and atelectasis. Chronically coarsened interstitial changes. Aortic Atherosclerosis (ICD10-I70.0). Electronically Signed   By: Kreg ShropshirePrice  DeHay M.D.   On: 10/19/2020 20:20   CT Head Wo Contrast  Result Date: 10/19/2020 CLINICAL DATA:  87 year old female with head trauma. EXAM: CT HEAD WITHOUT CONTRAST CT CERVICAL SPINE WITHOUT CONTRAST TECHNIQUE: Multidetector CT imaging of the head and cervical spine was performed following the standard protocol without intravenous contrast. Multiplanar CT image reconstructions of the cervical spine were also generated. COMPARISON:  Head CT dated 01/05/2020. FINDINGS: Evaluation of this exam is limited due to motion artifact. CT HEAD FINDINGS Brain: Mild age-related atrophy and moderate chronic microvascular ischemic changes. There is no acute intracranial hemorrhage. No mass effect or midline shift. No extra-axial fluid collection. Vascular: No hyperdense vessel or unexpected calcification. Atherosclerotic calcification of the cavernous ICA. Skull: Normal.  Negative for fracture or focal lesion. Sinuses/Orbits: No acute finding. Other: None CT CERVICAL SPINE FINDINGS Alignment: No acute subluxation. There is straightening of normal cervical lordosis which may be positional or due to muscle spasm. Skull base and vertebrae: No acute fracture.  Osteopenia. Soft tissues and spinal canal: No prevertebral fluid or swelling. No visible canal hematoma. Disc levels:  No acute findings.  Mild degenerative changes. Upper chest: Negative. Other: Advanced atherosclerotic calcification of the aortic arch as well as bilateral carotid bulb calcified plaques. There is heterogeneous appearance of the thyroid gland. IMPRESSION: 1. No acute intracranial pathology. 2. Age-related atrophy and chronic microvascular ischemic changes. 3. No acute/traumatic cervical spine pathology. Electronically Signed   By: Elgie CollardArash  Radparvar M.D.   On: 10/19/2020 20:56   CT Cervical Spine Wo Contrast  Result Date: 10/19/2020 CLINICAL DATA:  87 year old female with head trauma. EXAM: CT HEAD WITHOUT CONTRAST CT CERVICAL SPINE WITHOUT CONTRAST TECHNIQUE: Multidetector CT imaging of the head and cervical spine was performed following the standard protocol without intravenous contrast. Multiplanar CT image reconstructions of the cervical spine were also generated.  COMPARISON:  Head CT dated 01/05/2020. FINDINGS: Evaluation of this exam is limited due to motion artifact. CT HEAD FINDINGS Brain: Mild age-related atrophy and moderate chronic microvascular ischemic changes. There is no acute intracranial hemorrhage. No mass effect or midline shift. No extra-axial fluid collection. Vascular: No hyperdense vessel or unexpected calcification. Atherosclerotic calcification of the cavernous ICA. Skull: Normal. Negative for fracture or focal lesion. Sinuses/Orbits: No acute finding. Other: None CT CERVICAL SPINE FINDINGS Alignment: No acute subluxation. There is straightening of normal cervical lordosis which may be  positional or due to muscle spasm. Skull base and vertebrae: No acute fracture.  Osteopenia. Soft tissues and spinal canal: No prevertebral fluid or swelling. No visible canal hematoma. Disc levels:  No acute findings.  Mild degenerative changes. Upper chest: Negative. Other: Advanced atherosclerotic calcification of the aortic arch as well as bilateral carotid bulb calcified plaques. There is heterogeneous appearance of the thyroid gland. IMPRESSION: 1. No acute intracranial pathology. 2. Age-related atrophy and chronic microvascular ischemic changes. 3. No acute/traumatic cervical spine pathology. Electronically Signed   By: Elgie Collard M.D.   On: 10/19/2020 20:56   CT Hip Right Wo Contrast  Result Date: 10/19/2020 CLINICAL DATA:  87 year old female with trauma to the right hip. EXAM: CT OF THE RIGHT HIP WITHOUT CONTRAST TECHNIQUE: Multidetector CT imaging of the right hip was performed according to the standard protocol. Multiplanar CT image reconstructions were also generated. COMPARISON:  Right hip radiograph dated 10/19/2020. FINDINGS: Evaluation is very limited due to streak artifact caused by right hip arthroplasty. Bones/Joint/Cartilage There is a total right hip arthroplasty. The arthroplasty components appear intact and in anatomic alignment. There is a nondisplaced fracture of the proximal femur involving the subtrochanteric region (coronal 60/6). No evidence of loosening. The bones are osteopenic. No dislocation. Ligaments Suboptimally assessed by CT. Muscles and Tendons Mild intramuscular edema. No fluid collection or hematoma. Soft tissues There is diffuse subcutaneous edema. No fluid collection hematoma. Aortoiliac atherosclerotic disease. IMPRESSION: 1. Nondisplaced subtrochanteric fracture of the right femur. No dislocation. 2. Total right hip arthroplasty. No evidence of loosening. 3. Aortic Atherosclerosis (ICD10-I70.0). Electronically Signed   By: Elgie Collard M.D.   On: 10/19/2020  21:40   DG Hip Unilat W or Wo Pelvis 2-3 Views Right  Result Date: 10/19/2020 CLINICAL DATA:  Radiograph 12/05/2018 EXAM: DG HIP (WITH OR WITHOUT PELVIS) 2-3V RIGHT COMPARISON:  Radiograph 12/05/2018 FINDINGS: Prior total right hip arthroplasty. Hardware remains in expected alignment however there is conspicuous cortical step-off towards the collared femoral stem which could reflect a periprosthetic hardware fracture. Additionally, cortical thickening along the medial proximal femoral diaphysis towards the terminus of the stem could reflect some stress reaction related change. More chronic, corticated fragmentation along the acetabular cup is unchanged from comparison. Overlying soft tissue swelling of the right hip is present. No other acute traumatic findings in the bony pelvis. Left proximal femur is intact and normally located. Degenerative changes in the spine, hips and pelvis. Extensive calcified phleboliths. Mild edematous soft tissue changes more diffusely as well. IMPRESSION: Appearance concerning for a periprosthetic fracture involving the proximal portion of the collared femoral stem of a total right hip arthroplasty. Hardware remains otherwise intact and aligned. Stress reaction with cortical thickening along medial aspect of the proximal femoral diaphysis near the terminus of the femoral stem. Degenerative changes in the spine hips and pelvis. Diffuse bony demineralization. Electronically Signed   By: Kreg Shropshire M.D.   On: 10/19/2020 20:22     Subjective: - no  chest pain, shortness of breath, no abdominal pain, nausea or vomiting.   Discharge Exam: BP (!) 152/54 (BP Location: Left Arm)   Pulse 72   Temp 97.9 F (36.6 C) (Oral)   Resp 17   Ht 5' 1.5" (1.562 m)   Wt 54.4 kg   SpO2 94%   BMI 22.29 kg/m   General: Pt is alert, awake, not in acute distress Cardiovascular: RRR, S1/S2 +, no rubs, no gallops Respiratory: CTA bilaterally, no wheezing, no rhonchi Abdominal: Soft, NT, ND,  bowel sounds + Extremities: no edema, no cyanosis   The results of significant diagnostics from this hospitalization (including imaging, microbiology, ancillary and laboratory) are listed below for reference.     Microbiology: Recent Results (from the past 240 hour(s))  Resp Panel by RT-PCR (Flu A&B, Covid) Nasopharyngeal Swab     Status: None   Collection Time: 10/19/20  9:59 PM   Specimen: Nasopharyngeal Swab; Nasopharyngeal(NP) swabs in vial transport medium  Result Value Ref Range Status   SARS Coronavirus 2 by RT PCR NEGATIVE NEGATIVE Final    Comment: (NOTE) SARS-CoV-2 target nucleic acids are NOT DETECTED.  The SARS-CoV-2 RNA is generally detectable in upper respiratory specimens during the acute phase of infection. The lowest concentration of SARS-CoV-2 viral copies this assay can detect is 138 copies/mL. A negative result does not preclude SARS-Cov-2 infection and should not be used as the sole basis for treatment or other patient management decisions. A negative result may occur with  improper specimen collection/handling, submission of specimen other than nasopharyngeal swab, presence of viral mutation(s) within the areas targeted by this assay, and inadequate number of viral copies(<138 copies/mL). A negative result must be combined with clinical observations, patient history, and epidemiological information. The expected result is Negative.  Fact Sheet for Patients:  BloggerCourse.com  Fact Sheet for Healthcare Providers:  SeriousBroker.it  This test is no t yet approved or cleared by the [place] FDA and  has been authorized for detection and/or diagnosis of SARS-CoV-2 by FDA under an Emergency Use Authorization (EUA). This EUA will remain  in effect (meaning this test can be used) for the duration of the COVID-19 declaration under Section 564(b)(1) of the Act, 21 U.S.C.section 360bbb-3(b)(1), unless the  authorization is terminated  or revoked sooner.       Influenza A by PCR NEGATIVE NEGATIVE Final   Influenza B by PCR NEGATIVE NEGATIVE Final    Comment: (NOTE) The Xpert Xpress SARS-CoV-2/FLU/RSV plus assay is intended as an aid in the diagnosis of influenza from Nasopharyngeal swab specimens and should not be used as a sole basis for treatment. Nasal washings and aspirates are unacceptable for Xpert Xpress SARS-CoV-2/FLU/RSV testing.  Fact Sheet for Patients: BloggerCourse.com  Fact Sheet for Healthcare Providers: SeriousBroker.it  This test is not yet approved or cleared by the [place] FDA and has been authorized for detection and/or diagnosis of SARS-CoV-2 by FDA under an Emergency Use Authorization (EUA). This EUA will remain in effect (meaning this test can be used) for the duration of the COVID-19 declaration under Section 564(b)(1) of the Act, 21 U.S.C. section 360bbb-3(b)(1), unless the authorization is terminated or revoked.  Performed at [facility], [address], [place], [place] [address]      Labs: Basic Metabolic Panel: Recent Labs  Lab 10/22/20 0706 10/23/20 0416  NA 138 137  K 3.7 4.1  CL 105 106  CO2 25 25  GLUCOSE 120* 122*  BUN 14 19  CREATININE 0.95  1.03*  CALCIUM 8.7* 8.8*   Liver Function Tests: No results for input(s): AST, ALT, ALKPHOS, BILITOT, PROT, ALBUMIN in the last 168 hours. CBC: Recent Labs  Lab 10/22/20 0706 10/23/20 0416  WBC 5.4 4.6  HGB 12.0 10.3*  HCT 35.6* 31.1*  MCV 93.2 95.1  PLT 104* 126*   CBG: Recent Labs  Lab 10/27/20 0636 10/27/20 1108 10/27/20 1608 10/27/20 2120 10/28/20 0635  GLUCAP 103* 113* 141* 117* 96   Hgb A1c No results for input(s): HGBA1C in the last 72 hours. Lipid Profile No results for input(s): CHOL, HDL, LDLCALC, TRIG, CHOLHDL, LDLDIRECT in the last 72 hours. Thyroid function studies No results for input(s): TSH,  T4TOTAL, T3FREE, THYROIDAB in the last 72 hours.  Invalid input(s): FREET3 Urinalysis No results found for: COLORURINE, APPEARANCEUR, LABSPEC, PHURINE, GLUCOSEU, HGBUR, BILIRUBINUR, KETONESUR, PROTEINUR, UROBILINOGEN, NITRITE, LEUKOCYTESUR  FURTHER DISCHARGE INSTRUCTIONS:   Get Medicines reviewed and adjusted: Please take all your medications with you for your next visit with your Primary MD   Laboratory/radiological data: Please request your Primary MD to go over all hospital tests and procedure/radiological results at the follow up, please ask your Primary MD to get all Hospital records sent to his/her office.   In some cases, they will be blood work, cultures and biopsy results pending at the time of your discharge. Please request that your primary care M.D. goes through all the records of your hospital data and follows up on these results.   Also Note the following: If you experience worsening of your admission symptoms, develop shortness of breath, life threatening emergency, suicidal or homicidal thoughts you must seek medical attention immediately by calling 911 or calling your MD immediately  if symptoms less severe.   You must read complete instructions/literature along with all the possible adverse reactions/side effects for all the Medicines you take and that have been prescribed to you. Take any new Medicines after you have completely understood and accpet all the possible adverse reactions/side effects.    Do not drive when taking Pain medications or sleeping medications (Benzodaizepines)   Do not take more than prescribed Pain, Sleep and Anxiety Medications. It is not advisable to combine anxiety,sleep and pain medications without talking with your primary care practitioner   Special Instructions: If you have smoked or chewed Tobacco  in the last 2 yrs please stop smoking, stop any regular Alcohol  and or any Recreational drug use.   Wear Seat belts while driving.   Please  note: You were cared for by a hospitalist during your hospital stay. Once you are discharged, your primary care physician will handle any further medical issues. Please note that NO REFILLS for any discharge medications will be authorized once you are discharged, as it is imperative that you return to your primary care physician (or establish a relationship with a primary care physician if you do not have one) for your post hospital discharge needs so that they can reassess your need for medications and monitor your lab values.  Time coordinating discharge: 40 minutes  SIGNED:  Pamella Pert, MD, PhD 10/28/2020, 10:04 AM

## 2020-10-31 DIAGNOSIS — I480 Paroxysmal atrial fibrillation: Secondary | ICD-10-CM | POA: Diagnosis not present

## 2020-10-31 DIAGNOSIS — N1831 Chronic kidney disease, stage 3a: Secondary | ICD-10-CM | POA: Diagnosis not present

## 2020-10-31 DIAGNOSIS — I10 Essential (primary) hypertension: Secondary | ICD-10-CM | POA: Diagnosis not present

## 2020-10-31 DIAGNOSIS — S72001D Fracture of unspecified part of neck of right femur, subsequent encounter for closed fracture with routine healing: Secondary | ICD-10-CM | POA: Diagnosis not present

## 2020-11-07 DIAGNOSIS — M25551 Pain in right hip: Secondary | ICD-10-CM | POA: Diagnosis not present

## 2020-11-08 DIAGNOSIS — F0390 Unspecified dementia without behavioral disturbance: Secondary | ICD-10-CM | POA: Diagnosis not present

## 2020-11-08 DIAGNOSIS — R2689 Other abnormalities of gait and mobility: Secondary | ICD-10-CM | POA: Diagnosis not present

## 2020-11-08 DIAGNOSIS — M6281 Muscle weakness (generalized): Secondary | ICD-10-CM | POA: Diagnosis not present

## 2020-11-08 DIAGNOSIS — I480 Paroxysmal atrial fibrillation: Secondary | ICD-10-CM | POA: Diagnosis not present

## 2020-11-08 DIAGNOSIS — I10 Essential (primary) hypertension: Secondary | ICD-10-CM | POA: Diagnosis not present

## 2020-11-08 DIAGNOSIS — M9701XD Periprosthetic fracture around internal prosthetic right hip joint, subsequent encounter: Secondary | ICD-10-CM | POA: Diagnosis not present

## 2020-11-08 DIAGNOSIS — N1831 Chronic kidney disease, stage 3a: Secondary | ICD-10-CM | POA: Diagnosis not present

## 2020-11-08 DIAGNOSIS — E46 Unspecified protein-calorie malnutrition: Secondary | ICD-10-CM | POA: Diagnosis not present

## 2020-11-19 DIAGNOSIS — M1611 Unilateral primary osteoarthritis, right hip: Secondary | ICD-10-CM | POA: Diagnosis not present

## 2020-11-19 DIAGNOSIS — Z9181 History of falling: Secondary | ICD-10-CM | POA: Diagnosis not present

## 2020-11-19 DIAGNOSIS — Z7901 Long term (current) use of anticoagulants: Secondary | ICD-10-CM | POA: Diagnosis not present

## 2020-11-19 DIAGNOSIS — Z90710 Acquired absence of both cervix and uterus: Secondary | ICD-10-CM | POA: Diagnosis not present

## 2020-11-19 DIAGNOSIS — I10 Essential (primary) hypertension: Secondary | ICD-10-CM | POA: Diagnosis not present

## 2020-11-19 DIAGNOSIS — F0390 Unspecified dementia without behavioral disturbance: Secondary | ICD-10-CM | POA: Diagnosis not present

## 2020-11-19 DIAGNOSIS — E119 Type 2 diabetes mellitus without complications: Secondary | ICD-10-CM | POA: Diagnosis not present

## 2020-11-19 DIAGNOSIS — I480 Paroxysmal atrial fibrillation: Secondary | ICD-10-CM | POA: Diagnosis not present

## 2020-11-19 DIAGNOSIS — S7224XD Nondisplaced subtrochanteric fracture of right femur, subsequent encounter for closed fracture with routine healing: Secondary | ICD-10-CM | POA: Diagnosis not present

## 2020-11-27 DIAGNOSIS — Z9181 History of falling: Secondary | ICD-10-CM | POA: Diagnosis not present

## 2020-11-27 DIAGNOSIS — F0390 Unspecified dementia without behavioral disturbance: Secondary | ICD-10-CM | POA: Diagnosis not present

## 2020-11-27 DIAGNOSIS — E119 Type 2 diabetes mellitus without complications: Secondary | ICD-10-CM | POA: Diagnosis not present

## 2020-11-27 DIAGNOSIS — I10 Essential (primary) hypertension: Secondary | ICD-10-CM | POA: Diagnosis not present

## 2020-11-27 DIAGNOSIS — Z7901 Long term (current) use of anticoagulants: Secondary | ICD-10-CM | POA: Diagnosis not present

## 2020-11-27 DIAGNOSIS — I480 Paroxysmal atrial fibrillation: Secondary | ICD-10-CM | POA: Diagnosis not present

## 2020-11-27 DIAGNOSIS — Z90710 Acquired absence of both cervix and uterus: Secondary | ICD-10-CM | POA: Diagnosis not present

## 2020-11-27 DIAGNOSIS — S7224XD Nondisplaced subtrochanteric fracture of right femur, subsequent encounter for closed fracture with routine healing: Secondary | ICD-10-CM | POA: Diagnosis not present

## 2020-11-27 DIAGNOSIS — M1611 Unilateral primary osteoarthritis, right hip: Secondary | ICD-10-CM | POA: Diagnosis not present

## 2020-12-02 DIAGNOSIS — S7224XD Nondisplaced subtrochanteric fracture of right femur, subsequent encounter for closed fracture with routine healing: Secondary | ICD-10-CM | POA: Diagnosis not present

## 2020-12-02 DIAGNOSIS — F0390 Unspecified dementia without behavioral disturbance: Secondary | ICD-10-CM | POA: Diagnosis not present

## 2020-12-02 DIAGNOSIS — E119 Type 2 diabetes mellitus without complications: Secondary | ICD-10-CM | POA: Diagnosis not present

## 2020-12-02 DIAGNOSIS — I480 Paroxysmal atrial fibrillation: Secondary | ICD-10-CM | POA: Diagnosis not present

## 2020-12-02 DIAGNOSIS — I10 Essential (primary) hypertension: Secondary | ICD-10-CM | POA: Diagnosis not present

## 2020-12-02 DIAGNOSIS — Z9181 History of falling: Secondary | ICD-10-CM | POA: Diagnosis not present

## 2020-12-02 DIAGNOSIS — M1611 Unilateral primary osteoarthritis, right hip: Secondary | ICD-10-CM | POA: Diagnosis not present

## 2020-12-02 DIAGNOSIS — Z90710 Acquired absence of both cervix and uterus: Secondary | ICD-10-CM | POA: Diagnosis not present

## 2020-12-02 DIAGNOSIS — Z7901 Long term (current) use of anticoagulants: Secondary | ICD-10-CM | POA: Diagnosis not present

## 2020-12-03 ENCOUNTER — Other Ambulatory Visit (HOSPITAL_COMMUNITY): Payer: Self-pay

## 2020-12-09 ENCOUNTER — Ambulatory Visit: Payer: Medicare PPO | Admitting: Family Medicine

## 2020-12-11 DIAGNOSIS — I480 Paroxysmal atrial fibrillation: Secondary | ICD-10-CM | POA: Diagnosis not present

## 2020-12-11 DIAGNOSIS — Z9181 History of falling: Secondary | ICD-10-CM | POA: Diagnosis not present

## 2020-12-11 DIAGNOSIS — M1611 Unilateral primary osteoarthritis, right hip: Secondary | ICD-10-CM | POA: Diagnosis not present

## 2020-12-11 DIAGNOSIS — Z7901 Long term (current) use of anticoagulants: Secondary | ICD-10-CM | POA: Diagnosis not present

## 2020-12-11 DIAGNOSIS — I10 Essential (primary) hypertension: Secondary | ICD-10-CM | POA: Diagnosis not present

## 2020-12-11 DIAGNOSIS — Z90710 Acquired absence of both cervix and uterus: Secondary | ICD-10-CM | POA: Diagnosis not present

## 2020-12-11 DIAGNOSIS — F0390 Unspecified dementia without behavioral disturbance: Secondary | ICD-10-CM | POA: Diagnosis not present

## 2020-12-11 DIAGNOSIS — E119 Type 2 diabetes mellitus without complications: Secondary | ICD-10-CM | POA: Diagnosis not present

## 2020-12-11 DIAGNOSIS — S7224XD Nondisplaced subtrochanteric fracture of right femur, subsequent encounter for closed fracture with routine healing: Secondary | ICD-10-CM | POA: Diagnosis not present

## 2020-12-17 ENCOUNTER — Encounter: Payer: Self-pay | Admitting: Family Medicine

## 2020-12-17 ENCOUNTER — Ambulatory Visit (INDEPENDENT_AMBULATORY_CARE_PROVIDER_SITE_OTHER): Payer: Medicare PPO | Admitting: Family Medicine

## 2020-12-17 ENCOUNTER — Other Ambulatory Visit: Payer: Self-pay

## 2020-12-17 VITALS — BP 138/80 | HR 70 | Temp 98.6°F | Wt 111.5 lb

## 2020-12-17 DIAGNOSIS — N39490 Overflow incontinence: Secondary | ICD-10-CM

## 2020-12-17 DIAGNOSIS — M25471 Effusion, right ankle: Secondary | ICD-10-CM | POA: Diagnosis not present

## 2020-12-17 DIAGNOSIS — Z23 Encounter for immunization: Secondary | ICD-10-CM

## 2020-12-17 DIAGNOSIS — M25472 Effusion, left ankle: Secondary | ICD-10-CM | POA: Diagnosis not present

## 2020-12-17 MED ORDER — OXYBUTYNIN CHLORIDE ER 10 MG PO TB24: 10.0000 mg | ORAL_TABLET | Freq: Every day | ORAL | 5 refills | Status: DC

## 2020-12-17 NOTE — Progress Notes (Signed)
   Subjective:    Patient ID: [name], female    DOB: 10-14-1928, 87 y.o.   MRN: [id number]  HPI Here with her daughter and husband for several issues. First she fell 3 weeks ago, and fortunately there were no fractures. She has had PT and she is getting around fairly well. She uses a walker now at all times. Also she has dealt with urinary incontinence for the past year and it is embarrassing to her. She wears protective pants when she goes out. She does not sense any urge with this and no discomfort. Lastly she complains about swelling in her feet. There is no pain and no SOB. She takes Lasix 3 days a week.   Review of Systems  Constitutional: Negative.   Respiratory: Negative.    Cardiovascular:  Positive for leg swelling. Negative for chest pain and palpitations.      Objective:   Physical Exam Constitutional:      Appearance: Normal appearance.     Comments: Using a walker   Cardiovascular:     Rate and Rhythm: Normal rate and regular rhythm.     Pulses: Normal pulses.     Heart sounds: Normal heart sounds.  Pulmonary:     Effort: Pulmonary effort is normal.     Breath sounds: Normal breath sounds.  Musculoskeletal:     Comments: 1+ edema in both feet   Neurological:     Mental Status: She is alert.          Assessment & Plan:  For the urinary incontinence, she will try Oxybutynin 10 mg qhs. She is recovering nicely from the fall. For the ankle edema, we could use more Lasix but this would make the incontinence worse. We agreed she would try knee high compression stockings. We spent 35 minutes reviewing records and discussing these issues.  Gershon Crane, MD

## 2020-12-19 ENCOUNTER — Telehealth: Payer: Self-pay | Admitting: Family Medicine

## 2020-12-19 DIAGNOSIS — I10 Essential (primary) hypertension: Secondary | ICD-10-CM | POA: Diagnosis not present

## 2020-12-19 DIAGNOSIS — Z90710 Acquired absence of both cervix and uterus: Secondary | ICD-10-CM | POA: Diagnosis not present

## 2020-12-19 DIAGNOSIS — Z9181 History of falling: Secondary | ICD-10-CM | POA: Diagnosis not present

## 2020-12-19 DIAGNOSIS — I480 Paroxysmal atrial fibrillation: Secondary | ICD-10-CM | POA: Diagnosis not present

## 2020-12-19 DIAGNOSIS — F0390 Unspecified dementia without behavioral disturbance: Secondary | ICD-10-CM | POA: Diagnosis not present

## 2020-12-19 DIAGNOSIS — M1611 Unilateral primary osteoarthritis, right hip: Secondary | ICD-10-CM | POA: Diagnosis not present

## 2020-12-19 DIAGNOSIS — S7224XD Nondisplaced subtrochanteric fracture of right femur, subsequent encounter for closed fracture with routine healing: Secondary | ICD-10-CM | POA: Diagnosis not present

## 2020-12-19 DIAGNOSIS — Z7901 Long term (current) use of anticoagulants: Secondary | ICD-10-CM | POA: Diagnosis not present

## 2020-12-19 DIAGNOSIS — E119 Type 2 diabetes mellitus without complications: Secondary | ICD-10-CM | POA: Diagnosis not present

## 2020-12-19 NOTE — Telephone Encounter (Signed)
[name] [phone], from [facility] called to advise that the PT declined the nursing evaluation and nursing at this time. PT states they just need physical therapy and they are already in. If any questions please contact Angie at [phone].

## 2020-12-20 NOTE — Telephone Encounter (Signed)
Please advise 

## 2020-12-23 NOTE — Telephone Encounter (Signed)
That's fine, just stick with PT for now

## 2020-12-27 ENCOUNTER — Other Ambulatory Visit: Payer: Self-pay | Admitting: Family Medicine

## 2021-01-10 DIAGNOSIS — E119 Type 2 diabetes mellitus without complications: Secondary | ICD-10-CM | POA: Diagnosis not present

## 2021-01-10 DIAGNOSIS — I10 Essential (primary) hypertension: Secondary | ICD-10-CM | POA: Diagnosis not present

## 2021-01-10 DIAGNOSIS — F0390 Unspecified dementia without behavioral disturbance: Secondary | ICD-10-CM | POA: Diagnosis not present

## 2021-01-10 DIAGNOSIS — Z9181 History of falling: Secondary | ICD-10-CM | POA: Diagnosis not present

## 2021-01-10 DIAGNOSIS — I480 Paroxysmal atrial fibrillation: Secondary | ICD-10-CM | POA: Diagnosis not present

## 2021-01-10 DIAGNOSIS — Z7901 Long term (current) use of anticoagulants: Secondary | ICD-10-CM | POA: Diagnosis not present

## 2021-01-10 DIAGNOSIS — M1611 Unilateral primary osteoarthritis, right hip: Secondary | ICD-10-CM | POA: Diagnosis not present

## 2021-01-10 DIAGNOSIS — S7224XD Nondisplaced subtrochanteric fracture of right femur, subsequent encounter for closed fracture with routine healing: Secondary | ICD-10-CM | POA: Diagnosis not present

## 2021-01-10 DIAGNOSIS — Z90710 Acquired absence of both cervix and uterus: Secondary | ICD-10-CM | POA: Diagnosis not present

## 2021-01-18 DIAGNOSIS — I2510 Atherosclerotic heart disease of native coronary artery without angina pectoris: Secondary | ICD-10-CM | POA: Diagnosis not present

## 2021-01-18 DIAGNOSIS — E119 Type 2 diabetes mellitus without complications: Secondary | ICD-10-CM | POA: Diagnosis not present

## 2021-01-18 DIAGNOSIS — H409 Unspecified glaucoma: Secondary | ICD-10-CM | POA: Diagnosis not present

## 2021-01-18 DIAGNOSIS — F0390 Unspecified dementia without behavioral disturbance: Secondary | ICD-10-CM | POA: Diagnosis not present

## 2021-01-18 DIAGNOSIS — I10 Essential (primary) hypertension: Secondary | ICD-10-CM | POA: Diagnosis not present

## 2021-01-18 DIAGNOSIS — I4891 Unspecified atrial fibrillation: Secondary | ICD-10-CM | POA: Diagnosis not present

## 2021-01-18 DIAGNOSIS — N3941 Urge incontinence: Secondary | ICD-10-CM | POA: Diagnosis not present

## 2021-01-18 DIAGNOSIS — M1990 Unspecified osteoarthritis, unspecified site: Secondary | ICD-10-CM | POA: Diagnosis not present

## 2021-01-18 DIAGNOSIS — D6869 Other thrombophilia: Secondary | ICD-10-CM | POA: Diagnosis not present

## 2021-02-01 ENCOUNTER — Other Ambulatory Visit (HOSPITAL_COMMUNITY): Payer: Self-pay | Admitting: Nurse Practitioner

## 2021-03-10 ENCOUNTER — Emergency Department (HOSPITAL_COMMUNITY): Payer: Medicare PPO

## 2021-03-10 ENCOUNTER — Emergency Department (HOSPITAL_COMMUNITY)
Admission: EM | Admit: 2021-03-10 | Discharge: 2021-03-10 | Disposition: A | Discharge status: Home / Self Care | Payer: Medicare PPO | Attending: Emergency Medicine | Admitting: Emergency Medicine

## 2021-03-10 ENCOUNTER — Encounter (HOSPITAL_COMMUNITY): Payer: Self-pay | Admitting: Emergency Medicine

## 2021-03-10 ENCOUNTER — Telehealth: Payer: Self-pay | Admitting: Family Medicine

## 2021-03-10 DIAGNOSIS — R296 Repeated falls: Secondary | ICD-10-CM | POA: Diagnosis not present

## 2021-03-10 DIAGNOSIS — Z7901 Long term (current) use of anticoagulants: Secondary | ICD-10-CM | POA: Insufficient documentation

## 2021-03-10 DIAGNOSIS — I10 Essential (primary) hypertension: Secondary | ICD-10-CM | POA: Insufficient documentation

## 2021-03-10 DIAGNOSIS — S22070A Wedge compression fracture of T9-T10 vertebra, initial encounter for closed fracture: Secondary | ICD-10-CM | POA: Insufficient documentation

## 2021-03-10 DIAGNOSIS — E119 Type 2 diabetes mellitus without complications: Secondary | ICD-10-CM | POA: Insufficient documentation

## 2021-03-10 DIAGNOSIS — M546 Pain in thoracic spine: Secondary | ICD-10-CM | POA: Diagnosis not present

## 2021-03-10 DIAGNOSIS — Z043 Encounter for examination and observation following other accident: Secondary | ICD-10-CM | POA: Diagnosis not present

## 2021-03-10 DIAGNOSIS — S0990XA Unspecified injury of head, initial encounter: Secondary | ICD-10-CM | POA: Diagnosis not present

## 2021-03-10 DIAGNOSIS — M542 Cervicalgia: Secondary | ICD-10-CM | POA: Diagnosis not present

## 2021-03-10 DIAGNOSIS — M25561 Pain in right knee: Secondary | ICD-10-CM | POA: Diagnosis not present

## 2021-03-10 DIAGNOSIS — Z79899 Other long term (current) drug therapy: Secondary | ICD-10-CM | POA: Insufficient documentation

## 2021-03-10 DIAGNOSIS — S7001XA Contusion of right hip, initial encounter: Secondary | ICD-10-CM | POA: Diagnosis not present

## 2021-03-10 DIAGNOSIS — W19XXXA Unspecified fall, initial encounter: Secondary | ICD-10-CM | POA: Diagnosis not present

## 2021-03-10 DIAGNOSIS — Z96642 Presence of left artificial hip joint: Secondary | ICD-10-CM | POA: Insufficient documentation

## 2021-03-10 DIAGNOSIS — M47816 Spondylosis without myelopathy or radiculopathy, lumbar region: Secondary | ICD-10-CM | POA: Diagnosis not present

## 2021-03-10 DIAGNOSIS — I672 Cerebral atherosclerosis: Secondary | ICD-10-CM | POA: Diagnosis not present

## 2021-03-10 DIAGNOSIS — M549 Dorsalgia, unspecified: Secondary | ICD-10-CM | POA: Diagnosis not present

## 2021-03-10 LAB — BASIC METABOLIC PANEL
Anion gap: 6 (ref 5–15)
BUN: 18 mg/dL (ref 8–23)
CO2: 28 mmol/L (ref 22–32)
Calcium: 9.2 mg/dL (ref 8.9–10.3)
Chloride: 107 mmol/L (ref 98–111)
Creatinine, Ser: 0.72 mg/dL (ref 0.44–1.00)
GFR, Estimated: >60 mL/min (ref >60)
Glucose, Bld: 126 mg/dL — ABNORMAL HIGH (ref 70–99)
Potassium: 3.9 mmol/L (ref 3.5–5.1)
Sodium: 141 mmol/L (ref 135–145)

## 2021-03-10 LAB — CBC
HCT: 36.7 % (ref 36.0–46.0)
Hemoglobin: 12.1 g/dL (ref 12.0–15.0)
MCH: 31 pg (ref 26.0–34.0)
MCHC: 33 g/dL (ref 30.0–36.0)
MCV: 94.1 fL (ref 80.0–100.0)
Platelets: 147 10*3/uL — ABNORMAL LOW (ref 150–400)
RBC: 3.9 MIL/uL (ref 3.87–5.11)
RDW: 12.7 % (ref 11.5–15.5)
WBC: 4.8 10*3/uL (ref 4.0–10.5)
nRBC: 0 % (ref 0.0–0.2)

## 2021-03-10 NOTE — Telephone Encounter (Signed)
Janie access nurse is calling to let office know she advise the pt daughter lynelle to take her mother to ER. Pt fell last week and is on eliquis and has swollen feet and bruised to back of pt knees. Pt daughter said her mom is refusing to go to ER

## 2021-03-10 NOTE — Discharge Instructions (Signed)
Use acetaminophen as needed for pain. Recheck with your doctor if any further problems

## 2021-03-10 NOTE — Telephone Encounter (Signed)
FYI Spoke with pt daughter who is listed on pt DRP, advised that  Dr Clent Ridges is not in the office and he is fully booked for tomorrow. Advise that pt needs to go to the ED for further evaluation incase the bruise at the back of pt knee is a blood clot. Pt daughter agreed and verbalized understanding to take pt to the ED.

## 2021-03-10 NOTE — Telephone Encounter (Signed)
Pt daughter lynelle is calling and would like a return call today

## 2021-03-10 NOTE — ED Provider Notes (Addendum)
[facility] DEPT Provider Note   CSN: [id number] Arrival date & time: 03/10/21  1206     History Chief Complaint  Patient presents with   Fall    Othelia CECILEY SAILORS is a 87 y.o. female.  HPI 87 yo female ho pafib, dm, presents with fall two nights ago.  Daughter, Magda Paganini, at bedside giving additional history. States she has fallen several times before when she has gotten up to go to bathroom at night.  She does remember specifics, but Magda Paganini states that is not unusual.      Past Medical History:  Diagnosis Date   Diabetes mellitus without complication ([place])    Diverticulitis    Glaucoma    Hypercholesterolemia    Hypertension    PAF (paroxysmal atrial fibrillation) ([place])    sees Roderic Palau NP    Shingles     Patient Active Problem List   Diagnosis Date Noted   Overflow incontinence of urine 12/17/2020   Ankle edema, bilateral 12/17/2020   S/P right hip fracture 10/19/2020   Glaucoma 03/19/2020   Dyslipidemia 03/19/2020   Memory loss 03/19/2020   Syncope 05/03/2018   AF (paroxysmal atrial fibrillation) ([place]) 05/03/2018   Lower GI bleed 07/31/2012   DM2 (diabetes mellitus, type 2) ([place]) 07/31/2012   HTN (hypertension) 07/31/2012    Past Surgical History:  Procedure Laterality Date   ABDOMINAL HYSTERECTOMY     COLONOSCOPY     per Dr. Benson Norway    TOTAL HIP ARTHROPLASTY  2005   left sided     OB History   No obstetric history on file.     Family History  Problem Relation Age of Onset   CAD Father     Social History   Tobacco Use   Smoking status: Never   Smokeless tobacco: Never  Vaping Use   Vaping Use: Never used  Substance Use Topics   Alcohol use: Never    Comment: occassionally   Drug use: Never    Home Medications Prior to Admission medications   Medication Sig Start Date End Date Taking? Authorizing Provider  acetaminophen (TYLENOL) 500 MG tablet Take 500 mg by mouth every 6 (six) hours as needed.      [provider]  brimonidine-timolol (COMBIGAN) 0.2-0.5 % ophthalmic solution Place 1 drop into both eyes every 12 (twelve) hours.    [provider]  donepezil (ARICEPT) 5 MG tablet TAKE 1 TABLET(5 MG) BY MOUTH AT BEDTIME 12/27/20   Laurey Morale, MD  donepezil (ARICEPT) 5 MG tablet Take 5 mg by mouth daily. 09/23/20   [provider]  ELIQUIS 2.5 MG TABS tablet TAKE 1 TABLET BY MOUTH TWICE DAILY 09/16/20   Sherran Needs, NP  ELIQUIS 2.5 MG TABS tablet Take 2.5 mg by mouth 2 (two) times daily. 10/18/20   [provider]  escitalopram (LEXAPRO) 5 MG tablet Take 1 tablet (5 mg total) by mouth daily. 09/10/20   Cameron Sprang, MD  furosemide (LASIX) 20 MG tablet TAKE HALF TABLET BY MOUTH EVERY MONDAY, WEDNESDAY, AND FRIDAY 01/30/20   Sherran Needs, NP  HYDROcodone-acetaminophen (NORCO/VICODIN) 5-325 MG tablet Take 1-2 tablets by mouth every 6 (six) hours as needed for moderate pain. 10/25/20   Danford, Suann Larry, MD  influenza vaccine adjuvanted (FLUAD QUADRIVALENT) 0.5 ML injection Fluad Quad 2020-2021(50yr up)(PF) 60 mcg (15 mcg x 4)/0.44mL IM syringe  ADM 0.5ML IM UTD    [provider]  LUMIGAN 0.01 % SOLN Place 1  drop into both eyes at bedtime. 11/10/17   [provider]  metoprolol tartrate (LOPRESSOR) 25 MG tablet TAKE 1/2 TABLET(12.5 MG) BY MOUTH TWICE DAILY 02/03/21   Sherran Needs, NP  nepafenac (ILEVRO) 0.3 % ophthalmic suspension Place 1 drop into both eyes 2 (two) times daily. 11/20/19   [provider]  oxybutynin (DITROPAN XL) 10 MG 24 hr tablet Take 1 tablet (10 mg total) by mouth at bedtime. 12/17/20   Laurey Morale, MD  triamcinolone cream (KENALOG) 0.1 % Apply 1 application topically 2 (two) times daily. 07/23/20   Laurey Morale, MD    Allergies    Penicillins and Benadryl [diphenhydramine]  Review of Systems   Review of Systems  Genitourinary:  Positive for frequency.   Physical Exam Updated Vital Signs BP  (!) 196/77 (BP Location: Left Arm)   Pulse 73   Temp 98.2 F (36.8 C) (Oral)   Resp 16   SpO2 100%   Physical Exam Vitals and nursing note reviewed.  Constitutional:      Appearance: Normal appearance.  HENT:     Head: Normocephalic and atraumatic.     Right Ear: External ear normal.     Left Ear: External ear normal.     Nose: Nose normal.     Mouth/Throat:     Mouth: Mucous membranes are moist.  Musculoskeletal:     Comments: Mild ttp righ tknee and right hip Mild ttp lower cervical spine and mid thoracic spine  Skin:    Comments: No bruises noted   Neurological:     Mental Status: She is alert.    ED Results / Procedures / Treatments   Labs (all labs ordered are listed, but only abnormal results are displayed) Labs Reviewed  CBC - Abnormal; Notable for the following components:      Result Value   Platelets 147 (*)    All other components within normal limits  BASIC METABOLIC PANEL - Abnormal; Notable for the following components:   Glucose, Bld 126 (*)    All other components within normal limits    EKG None  Radiology CT Head Wo Contrast  Result Date: 03/10/2021 CLINICAL DATA:  [place] a few days ago with trauma to the head and neck. EXAM: CT HEAD WITHOUT CONTRAST TECHNIQUE: Contiguous axial images were obtained from the base of the skull through the vertex without intravenous contrast. COMPARISON:  10/19/2020 FINDINGS: Brain: Age related atrophy. Mild chronic small-vessel ischemic change of the hemispheric white matter. No sign of acute infarction, mass lesion, hemorrhage, hydrocephalus or extra-axial collection. Vascular: There is atherosclerotic calcification of the major vessels at the base of the brain. Skull: Negative Sinuses/Orbits: Clear/normal Other: None IMPRESSION: No acute or traumatic finding. Age related atrophy. Chronic small-vessel ischemic change of the white matter. Electronically Signed   By: Nelson Chimes M.D.   On: 03/10/2021 14:03   CT Cervical  Spine Wo Contrast  Result Date: 03/10/2021 CLINICAL DATA:  [place] a few days ago EXAM: CT CERVICAL SPINE WITHOUT CONTRAST TECHNIQUE: Multidetector CT imaging of the cervical spine was performed without intravenous contrast. Multiplanar CT image reconstructions were also generated. COMPARISON:  10/19/2020 FINDINGS: Alignment: No malalignment. Skull base and vertebrae: No acute fracture. Old minimal superior endplate depressions at C7 and T2. Soft tissues and spinal canal: No traumatic soft tissue finding. Carotid calcification. Disc levels: No significant disc level degenerative disease. Facet osteoarthritis on the right at C2-3 which could be painful. Upper chest: Negative Other: None IMPRESSION: No  acute or traumatic finding. Old minimal superior endplate depressions at C7 and T2 as seen previously. Chronic facet osteoarthritis on the right at C2-3. Electronically Signed   By: Paulina Fusi M.D.   On: 03/10/2021 14:06   CT Thoracic Spine Wo Contrast  Result Date: 03/10/2021 CLINICAL DATA:  Fall common back pain EXAM: CT THORACIC SPINE WITHOUT CONTRAST TECHNIQUE: Multidetector CT images of the thoracic were obtained using the standard protocol without intravenous contrast. COMPARISON:  Thoracic spine radiographs 12/05/2018, CT abdomen/pelvis 05/03/2018 FINDINGS: Alignment: Normal. Vertebrae: There is mild compression deformity of the T4 vertebral body with approximately 10% loss of vertebral body height. No definite fracture plane is seen. There is mild compression deformity of the T10 vertebral body with a prominent Schmorl's node indenting the inferior endplate, unchanged compared to the prior CT abdomen/pelvis. Mild compression deformity of the T12 vertebral body with a prominent Schmorl's node indenting the superior endplate is also not significantly changed. Compression deformity of the L1 vertebral body with up to approximately 40% loss of vertebral body height centrally has progressed since the CT  abdomen/pelvis from 05/03/2018. There is no suspicious osseous lesion. Paraspinal and other soft tissues: The thyroid is enlarged and heterogeneous, unchanged. Heart is enlarged. There is extensive calcified atherosclerotic plaque throughout the thoracic aorta. The main pulmonary artery is enlarged measuring up to 3.6 cm. Disc levels: There is multilevel intervertebral disc space narrowing with associated degenerative endplate change. There is vacuum disc phenomenon at T10-T11 and T11-T12. Osseous spinal canal and neural foramina are patent. IMPRESSION: 1. Mild compression deformity of the T4 vertebral body is age-indeterminate. No definite fracture plane is seen. Correlate with point tenderness, and consider MRI to evaluate for marrow edema as clinically indicated. 2. Compression deformity of the L1 vertebral body has progressed since 05/03/2018, now with up to approximately 40% loss of vertebral body height. 3. Mild compression deformities of the T10 and T12 vertebral bodies are unchanged. 4. Enlarged main pulmonary artery which can be seen with pulmonary hypertension. Electronically Signed   By: Lesia Hausen M.D.   On: 03/10/2021 14:10   DG Knee Complete 4 Views Right  Result Date: 03/10/2021 CLINICAL DATA:  Fall.  Pain EXAM: RIGHT KNEE - COMPLETE 4+ VIEW COMPARISON:  None FINDINGS: Mild diffuse osteopenia. No joint effusion. No signs of acute fracture or dislocation. No significant arthropathy. IMPRESSION: 1. No acute findings. 2. Osteopenia. Electronically Signed   By: Signa Kell M.D.   On: 03/10/2021 14:17   DG Hip Unilat W or Wo Pelvis 2-3 Views Right  Result Date: 03/10/2021 CLINICAL DATA:  Fall.  Bruising. EXAM: DG HIP (WITH OR WITHOUT PELVIS) 2-3V RIGHT COMPARISON:  Right hip radiographs and CT 10/19/2020 at [facility] FINDINGS: Right hip is located. No new fractures are present. Previous fracture is healed. A vascular calcifications are present. Visualized pelvis is intact.  Degenerative changes are again noted in the lower lumbar spine. IMPRESSION: 1. No acute abnormality. 2. Healed right hip fracture. 3. Atherosclerosis. Electronically Signed   By: Marin Roberts M.D.   On: 03/10/2021 14:19    Procedures Procedures   Medications Ordered in ED Medications - No data to display  ED Course  I have reviewed the triage vital signs and the nursing notes.  Pertinent labs & imaging results that were available during my care of the patient were reviewed by me and considered in my medical decision making (see chart for details).    MDM Rules/Calculators/A&P  87 year old female with some memory issues presents today with fall 2 nights ago.  Specifics around the event are unclear as she was found on the ground and cannot remember what happened.  Her daughter states this is happened multiple times in the past.  EKG appears normal.  She has some pain in her right hip and right knee with no acute abnormality noted on x-Raimi Guillermo of the right hip and right knee.  She had some pain in her neck and thoracic spine.  There is a new T4 compression fracture approximately 10%.  Old L1 compression fracture.  Mild T10 and T12 compression fractures unchanged from prior Patient with normal lab work up.  She is ambulatory without difficulty.  Final Clinical Impression(s) / ED Diagnoses Final diagnoses:  Fall, initial encounter  Compression fracture of T10 vertebra, initial encounter [facility])    Rx / DC Orders ED Discharge Orders     None        Pattricia Boss, MD 03/10/21 1633    Pattricia Boss, MD 03/10/21 [phone]

## 2021-03-10 NOTE — ED Notes (Addendum)
Pt NAD in bed, a/ox4. Pt states she was told she fell but does not remember event. Daughter states pt husband found her on ground over the weekend. Pt states she has pain on point tenderness to C7-T1 area. Otherwise negative DCAPBTLS TIC on physical assessment. Denies blood thinners.

## 2021-03-10 NOTE — ED Notes (Signed)
Pt NAD, a/ox4. Pt verbalizes understanding of all DC and f/u instructions. All questions answered. Pt assisted into w/c and wheeled to parking lot, assisted into vehicle.  

## 2021-03-10 NOTE — ED Triage Notes (Addendum)
PT BIB daughter, fall in the night this weekend. Bruises noted by daughter to L hip and bilateral posterior knees. More irritable per daughter than baseline. PT denies complaints.

## 2021-03-10 NOTE — ED Notes (Signed)
Pt ambulates with steady gait and cane, provider made aware.

## 2021-03-13 ENCOUNTER — Other Ambulatory Visit: Payer: Self-pay

## 2021-03-13 ENCOUNTER — Encounter (HOSPITAL_COMMUNITY): Payer: Self-pay | Admitting: Nurse Practitioner

## 2021-03-13 ENCOUNTER — Encounter: Payer: Self-pay | Admitting: Physician Assistant

## 2021-03-13 ENCOUNTER — Ambulatory Visit: Payer: Medicare PPO | Admitting: Physician Assistant

## 2021-03-13 ENCOUNTER — Ambulatory Visit (HOSPITAL_COMMUNITY)
Admission: RE | Admit: 2021-03-13 | Discharge: 2021-03-13 | Disposition: A | Discharge status: Home / Self Care | Payer: Medicare PPO | Source: Ambulatory Visit | Attending: Nurse Practitioner | Admitting: Nurse Practitioner

## 2021-03-13 VITALS — BP 140/58 | HR 63 | Ht 61.5 in | Wt 115.8 lb

## 2021-03-13 VITALS — BP 192/80 | HR 68 | Resp 20 | Ht 61.5 in | Wt 115.0 lb

## 2021-03-13 DIAGNOSIS — I480 Paroxysmal atrial fibrillation: Secondary | ICD-10-CM | POA: Insufficient documentation

## 2021-03-13 DIAGNOSIS — F0150 Vascular dementia without behavioral disturbance: Secondary | ICD-10-CM

## 2021-03-13 DIAGNOSIS — F03B0 Unspecified dementia, moderate, without behavioral disturbance, psychotic disturbance, mood disturbance, and anxiety: Secondary | ICD-10-CM

## 2021-03-13 DIAGNOSIS — R296 Repeated falls: Secondary | ICD-10-CM | POA: Diagnosis not present

## 2021-03-13 DIAGNOSIS — D6869 Other thrombophilia: Secondary | ICD-10-CM

## 2021-03-13 MED ORDER — MEMANTINE HCL 10 MG PO TABS: ORAL_TABLET | ORAL | 11 refills | Status: DC

## 2021-03-13 NOTE — Progress Notes (Signed)
Primary Care Physician: Laurey Morale, MD Referring Physician: [facility] f/u    [name] is a 87 y.o. female with a h/o recent  hospitalization for diverticula bleed. She was noted to by ED RN to have syncope with HR in the 30's while having profuse rectal bleeding with clots. She also had some SVT on monitor and left with a holter monitor. This did show paroxysmal afib and she is now in the afib clinic to further discuss. She states that her bleeding spontaneously resolved and she was told she would not need a f/u colonoscopy. She is now in need of anticoagulation with new onset afib and with a chadsvasc score of 4. She is in SR today and was asymptomatic with afib on monitor. Her BB was increased when afib was found. She is chronically anemic with plt's around 120.   F/u in afib clinic 10/28. She was started on anticoagulation after hearing back from Dr. Benson Norway that it was reasonable to start and so far she is doing well. No signs of bleeding. No awareness of afib.  F/u in afib clinic, 11/19. Monitor reviewed that pt wore back in September that showed mostly sinus rhythm with low afib burden; She is in SR today but with a HR of 41 bpm, she is asymptomatic.No bleeding issues with eliquis, cbc done recently showed normal H/H. She had an echo done today, results pending.  F/u hospitalization in afib clinic 1/29. Pt has hypotension and dizziness and fell backward. She and her husband states that she did not pass out. Her HR is in the upper 40's with reduced BB dose to just once a day. She is not symptomatic with brady. She was in SR during her hospitalization.  Her BP's at home, reviewed today look in range without any low readings. They have not been recording the HR's.    F/u in afib clinic 06/13/19. She feels great but her EKG looks like an  atrial flutter with v rates around 100 bpm. She is asymptomatic. She does not think that she is on BB, for some reason it is not on her med list, but pharmacy  reports that she did pick up lopressor the end of January. It was a 90 day supply.   F/u in afib clinic, 06/22/19. She remains in atrial flutter but rate controlled now. She does confirm that she is taking BB. She  is asymptomatic other than noting some pedal edema. Her weight may be up 1-2 lbs. She  has had both covid shots now in the last month.  F/u in afib clinic, 07/06/19. She  is back in rhythm. Lasix did not help with her LLE.  F/u 4/22. Stopping amlodipine did help with her LLE plus low dose furosemide. Echo showed normal EF. She  is in rate controlled flutter today, she is completely   asymptomatic. Appears to be paroxysmal as she was in rhythm on last visit.  She denies any fatigue, shortness of breath with exertion, lightheaded.  F/u in afib clinic, 9/17. She had a diverticular bleed 12/03/19,treated at [facility]. HGB dropped  to 10.3 with HGB back to 11.0 8/24, she held her anticoagulation x one week and restarted . She  has not noted any further bleeding. She is in SR today. Has had some bilateral LLE , but  has not taken her prn lasix. Otherwise, no complaints. She  had labs drawn with her PCP right after hospitalization and has another appointment 10/5 for f/u.   F/u in  afib clinic, 07/10/20. She is in SR. She is here with her daughters at her husband is getting too feeble to bring her in. In fact, the daughters reprot that [name] is also getting more feeble and has had some falls. She usually does not fall using her cane in the house but she either forgets to use it or on purpose forgets it. For now do not think I need to stop her anticoagulation but had that discussion with the daughters that if frequent falls continue we may have to  discuss again. She has chronic LLE, has lasix to take but sits in a chair with her feet dangling all day and does not wear any compression socks.   F/u in afib clinic, 03/13/21. Since I saw pt last in March, she has had 2 falls. In July, she suffered a  mechanical fall and suffered a rt hip perioperative fracture, non surgical treatment was recommended. Marland Kitchen She was in the hospital 7/9 to 10/28/20. She then had a fall 11/28 and presented to the ER. Daughter states that she has had multiple falls.  There was a new T4 compression fracture approximately 10%.  Old L1 compression fracture.  Mild T10 and T12 compression fractures unchanged from prior.she ab=nd her husband are still living at home, but pt has  signs of dementia. She has two daughters that are active to overseeing their parents  care.   EKG today shows NSR at 61 bpm. She is with her husband and daughter today. She is alert and oriented and very pleasant today. She is using her walker and encouraged to ambulate with that at all times at home.    Today, she denies symptoms of palpitations, chest pain, shortness of breath, orthopnea, PND, lower extremity edema, dizziness, presyncope, syncope, or neurologic sequela. The patient is tolerating medications without difficulties and is otherwise without complaint today.   Past Medical History:  Diagnosis Date   Diabetes mellitus without complication ([place])    Diverticulitis    Glaucoma    Hypercholesterolemia    Hypertension    PAF (paroxysmal atrial fibrillation) ([place])    sees Roderic Palau NP    Shingles    Past Surgical History:  Procedure Laterality Date   ABDOMINAL HYSTERECTOMY     COLONOSCOPY     per Dr. Benson Norway    TOTAL HIP ARTHROPLASTY  2005   left sided    Current Outpatient Medications  Medication Sig Dispense Refill   acetaminophen (TYLENOL) 500 MG tablet Take 500 mg by mouth every 6 (six) hours as needed.      brimonidine-timolol (COMBIGAN) 0.2-0.5 % ophthalmic solution Place 1 drop into both eyes every 12 (twelve) hours.     donepezil (ARICEPT) 5 MG tablet TAKE 1 TABLET(5 MG) BY MOUTH AT BEDTIME 90 tablet 0   ELIQUIS 2.5 MG TABS tablet TAKE 1 TABLET BY MOUTH TWICE DAILY 60 tablet 11   furosemide (LASIX) 20 MG tablet TAKE HALF  TABLET BY MOUTH EVERY MONDAY, WEDNESDAY, AND FRIDAY 20 tablet 3   influenza vaccine adjuvanted (FLUAD QUADRIVALENT) 0.5 ML injection Fluad Quad 2020-2021(24yr up)(PF) 60 mcg (15 mcg x 4)/0.77mL IM syringe  ADM 0.5ML IM UTD     LUMIGAN 0.01 % SOLN Place 1 drop into both eyes at bedtime.  5   nepafenac (ILEVRO) 0.3 % ophthalmic suspension Place 1 drop into both eyes 2 (two) times daily.     oxybutynin (DITROPAN XL) 10 MG 24 hr tablet Take 1 tablet (10 mg total) by mouth  at bedtime. 30 tablet 5   metoprolol tartrate (LOPRESSOR) 25 MG tablet TAKE 1/2 TABLET(12.5 MG) BY MOUTH TWICE DAILY (Patient not taking: Reported on 03/13/2021) 30 tablet 3   No current facility-administered medications for this encounter.    Allergies  Allergen Reactions   Penicillins Anaphylaxis and Other (See Comments)    Did it involve swelling of the face/tongue/throat, SOB, or low BP? Yes Did it involve sudden or severe rash/hives, skin peeling, or any reaction on the inside of your mouth or nose? Yes Did you need to seek medical attention at a hospital or doctor's office? Yes When did it last happen? Unknown If all above answers are "NO", may proceed with cephalosporin use.  Did it involve swelling of the face/tongue/throat, SOB, or low BP? Yes Did it involve sudden or severe rash/hives, skin peeling, or any reaction on the inside of your mouth or nose? Yes Did you need to seek medical attention at a hospital or doctor's office? Yes When did it last happen? Unknown If all above answers are "NO", may proceed with cephalosporin use.    Benadryl [Diphenhydramine] Rash    Social History   Socioeconomic History   Marital status: Married    Spouse name: Not on file   Number of children: 2   Years of education: 12   Highest education level: Not on file  Occupational History   Not on file  Tobacco Use   Smoking status: Never   Smokeless tobacco: Never  Vaping Use   Vaping Use: Never used  Substance and Sexual  Activity   Alcohol use: Never    Comment: occassionally   Drug use: Never   Sexual activity: Not Currently  Other Topics Concern   Not on file  Social History Narrative   ** Merged History Encounter **       Right handed Drinks caffeine  One story home   Social Determinants of Health   Financial Resource Strain: Low Risk    Difficulty of Paying Living Expenses: Not hard at all  Food Insecurity: No Food Insecurity   Worried About Programme researcher, broadcasting/film/video in the Last Year: Never true   Ran Out of Food in the Last Year: Never true  Transportation Needs: No Transportation Needs   Lack of Transportation (Medical): No   Lack of Transportation (Non-Medical): No  Physical Activity: Inactive   Days of Exercise per Week: 0 days   Minutes of Exercise per Session: 0 min  Stress: No Stress Concern Present   Feeling of Stress : Not at all  Social Connections: Socially Integrated   Frequency of Communication with Friends and Family: More than three times a week   Frequency of Social Gatherings with Friends and Family: More than three times a week   Attends Religious Services: More than 4 times per year   Active Member of [employer] or Organizations: Yes   Attends Engineer, structural: More than 4 times per year   Marital Status: Married  Catering manager Violence: Not At Risk   Fear of Current or Ex-Partner: No   Emotionally Abused: No   Physically Abused: No   Sexually Abused: No    Family History  Problem Relation Age of Onset   CAD Father     ROS- All systems are reviewed and negative except as per the HPI above  Physical Exam: Vitals:   03/13/21 1357  Weight: 52.5 kg  Height: 5' 1.5" (1.562 m)   Wt Readings from Last 3  Encounters:  03/13/21 52.5 kg  12/17/20 50.6 kg  10/19/20 54.4 kg    Labs: Lab Results  Component Value Date   NA 141 03/10/2021   K 3.9 03/10/2021   CL 107 03/10/2021   CO2 28 03/10/2021   GLUCOSE 126 (H) 03/10/2021   BUN 18 03/10/2021    CREATININE 0.72 03/10/2021   CALCIUM 9.2 03/10/2021   PHOS 4.0 10/20/2020   MG 1.9 10/20/2020   Lab Results  Component Value Date   INR 1.1 01/05/2020   No results found for: CHOL, HDL, LDLCALC, TRIG   GEN- The patient is well appearing, alert and oriented x 3 today.   Head- normocephalic, atraumatic Eyes-  Sclera clear, conjunctiva pink Ears- hearing intact Oropharynx- clear Neck- supple, no JVP Lymph- no cervical lymphadenopathy Lungs- Clear to ausculation bilaterally, normal work of breathing Heart- Regular rate and rhythm, no murmurs, rubs or gallops, PMI not laterally displaced GI- soft, NT, ND, + BS Extremities- no clubbing, cyanosis, or edema MS- no significant deformity or atrophy Skin- no rash or lesion Psych- euthymic mood, full affect Neuro- strength and sensation are intact   Vent. rate 63 BPM PR interval 168 ms QRS duration 76 ms QT/QTcB 394/403 ms P-R-T axes 72 6 104 Normal sinus rhythm Left ventricular hypertrophy with repolarization abnormality ( R in aVL ) Abnormal ECG   Assessment and Plan:  1. Paroxysmal afib  She appears to be staying in rhythm Not on rate control for history of bradycardia   2. CHA2DS2VASc  score of 5 Discussed with pt, husband and daughter continuing anticoagulation with h/o of falls In accordance to a paper written on falls in the elderly on anticoagulation, you would have to fall 295 times a year for the benefit to stop anticoagulation to prevent brain bleeds to  outweigh the risk of stroke off anticoagulation. It was decided that pt would continue with eliquis 2.5 mg bid   3. Fall risk Use of walker recommended anytime pt was walking  I will see back in 6 moths   Butch Penny C. Junius Faucett, [facility] [address] [place], [place] [address] [phone]

## 2021-03-13 NOTE — Patient Instructions (Addendum)
4. Continue Aricept 5mg  daily  Start Memantine 10mg  tablets.  Take 1/2 tablet at bedtime for 2 weeks, then 1 tablet at bedtime   Side effects include dizziness, headache, diarrhea or constipation.    5. Recommend increasing supervision at home for safety   6. Follow-up in 6 months, call for any changes   FALL PRECAUTIONS: Be cautious when walking. Scan the area for obstacles that may increase the risk of trips and falls. When getting up in the mornings, sit up at the edge of the bed for a few minutes before getting out of bed. Consider elevating the bed at the head end to avoid drop of blood pressure when getting up. Walk always in a well-lit room (use night lights in the walls). Avoid area rugs or power cords from appliances in the middle of the walkways. Use a walker or a cane if necessary and consider physical therapy for balance exercise. Get your eyesight checked regularly.  HOME SAFETY: Consider the safety of the kitchen when operating appliances like stoves, microwave oven, and blender. Consider having supervision and share cooking responsibilities until no longer able to participate in those. Accidents with firearms and other hazards in the house should be identified and addressed as well.  ABILITY TO BE LEFT ALONE: If patient is unable to contact 911 operator, consider using LifeLine, or when the need is there, arrange for someone to stay with patients. Smoking is a fire hazard, consider supervision or cessation. Risk of wandering should be assessed by caregiver and if detected at any point, supervision and safe proof recommendations should be instituted.  MEDICATION SUPERVISION: Inability to self-administer medication needs to be constantly addressed. Implement a mechanism to ensure safe administration of the medications.  RECOMMENDATIONS FOR ALL PATIENTS WITH MEMORY PROBLEMS: 1. Continue to exercise (Recommend 30 minutes of walking everyday, or 3 hours every week) 2. Increase social  interactions - continue going to [place] and enjoy social gatherings with friends and family 3. Eat healthy, avoid fried foods and eat more fruits and vegetables 4. Maintain adequate blood pressure, blood sugar, and blood cholesterol level. Reducing the risk of stroke and cardiovascular disease also helps promoting better memory. 5. Avoid stressful situations. Live a simple life and avoid aggravations. Organize your time and prepare for the next day in anticipation. 6. Sleep well, avoid any interruptions of sleep and avoid any distractions in the bedroom that may interfere with adequate sleep quality 7. Avoid sugar, avoid sweets as there is a strong link between excessive sugar intake, diabetes, and cognitive impairment The Mediterranean diet has been shown to help patients reduce the risk of progressive memory disorders and reduces cardiovascular risk. This includes eating fish, eat fruits and green leafy vegetables, nuts like almonds and hazelnuts, walnuts, and also use olive oil. Avoid fast foods and fried foods as much as possible. Avoid sweets and sugar as sugar use has been linked to worsening of memory function.  There is always a concern of gradual progression of memory problems. If this is the case, then we may need to adjust level of care according to patient needs. Support, both to the patient and caregiver, should then be put into place.   Your provider has requested that you have labwork completed today. Please go to [facility] ([phone]) on the second floor of this building before leaving the office today. You do not need to check in. If you are not called within 15 minutes please check with the front desk.  We have  sent a referral to [facility] for your CT and they will call you directly to schedule your appointment. They are located at [address] [facility]. If you need to contact them directly please call [phone].

## 2021-03-13 NOTE — Progress Notes (Signed)
Assessment/Plan:    Moderate Dementia, possibly vascular without behavioral disturbance  Memory slightly worsened, difficulty with remembering to take medications, recent falls with several vertebral compression fractures.  Overall deconditioning, followed up as outpatient, will need PT.  MMSE today 18/30, delayed recall 0/3, unable to spell World backwards and unable to draw clock or pentagon or write a sentence. Recent CT head 11/28 without acute abnormalities, age-related atrophy.   Recommendations:  Discussed safety both in and out of the home.  Patient needs to follow-up with PCP, regarding PT for strength and balance, stressed today the need to use a walker to ambulate to prevent falls Discussed the importance of regular daily schedule to maintain brain function.  Naps should be scheduled and should be no longer than 60 minutes and should not occur after 2 PM.  Mediterranean diet is recommended  Continue donepezil 5 mg daily Side effects were discussed  Start Memantine 10 mg: Take 1 tablet (5 mg at night) for 2 weeks, then increase to 1 tablet (10 mg) at night.  Daughter prefers to leave the dosages to once a day, as the patient is noncompliant with the meds, misses several doses.  This is being addressed, the patient will need increased sitter assistance.   Follow up in 6 months.   Case discussed with Dr. Everlena Cooper who agrees with the plan     Subjective:   ED visits since last seen: On 11/26/ 2022, the patient sustained a fall with head and neck trauma, seen at the ER 2 days later, where imaging was performed.  [name] said that she had fallen several times before when had gotten up to go to the bathroom at night.  Apparently he was found on the ground, although she cannot remember what happened.  Work-up including EKG, right hip and right knee imaging were without fractures.  There was a new T4 compression fracture and old L1 compression fracture, as well as mild T10 and T12  compression fractures unchanged from prior.  Otherwise, no other findings were noted.  This was not unusual for her.  CT of the head without contrast again showed age-related atrophy, with chronic small vessel ischemic change of the white matter, without acute traumatic finding.  CT of the neck without contrast without new findings.  Hospital admissions: none  [name] is a 87 y.o. RH female with a history of hypertension, hyperlipidemia, diabetes seen today in follow up for memory loss. she was last seen in our office on 09/10/2020, at which time MMSE was 20/30.  Repeat CT without contrast was remarkable for mild age-related atrophy and moderate chronic microvascular ischemic changes. There is no acute intracranial hemorrhage. EEG on 09/18/2020 was negative for seizures.  This patient is accompanied in the office by her daughter who supplements the history.  Prev with a history of hypertension, hyperlipidemia, diabetes, presenting for evaluation of memory loss. She is accompanied by her daughter [name]  who helps supplement the history today. She lives with her husband who is also a patient in our clinic for memory loss. Previous records as well as any outside records available were reviewed prior to todays visit.  Patient is currently on She is on Donepezil  daily without side effects, as the full dose was not tolerated due to diarrhea.  She feels her memory is pretty good, "I can remember pretty good."  However, her daughter disagrees, she states that her memory may be worse, especially after falling again recently, "ending up in the  hospital with compression fractures ". The patient refuses to use her walker, and only uses her cane which she has named "[name] ".  Since discharge from the hospital, the patient has been overall stable, but her daughters states that she still repeats the same stories and asked the same questions, and has begun to place things in unusual places more often  Recently, she  put canned food in the freezer.  Due to mobility, she needs an aide to bathe herself, although hygiene concerns are noted.  She also needs assistance with changing.  Because of bowel incontinence, she confuses pants with underwear, and has been throwing these pads to the garbage.  She wears depends for urinary incontinence.  She is unable to cook.  Her daughter is in charge of the finances and driving.  The patient lives with her husband, who has also dementia, and the aide has not been giving that the medications, does doses have been missed.  Of note, although her mood is good, the daughter did not start Lexapro, for that reason, as the patient forgets to take them.  She sleeps well at night unless she has to get up to go to the bathroom.  Denies hallucinations or paranoia, nightmares or REM behavior.  She sleeps just as much as before, and likes to watch soap operas. She denies any headaches, dizziness, diplopia, dysarthria/dysphagia, neck pain, focal numbness/tingling/weakness, anosmia. She has low back pain.  No reported seizure activity.    HISTORY OF PRESENT ILLNESS 09/10/20: This is a pleasant 87 year old right-handed woman with a history of hypertension, hyperlipidemia, diabetes, presenting for evaluation of memory loss. She is accompanied by her daughter [name] who helps supplement the history today. She lives with her husband who is also a patient in our clinic for memory loss. She feels her memory is pretty good, "I can remember pretty good." [name] reports that significant memory changes started quite quickly after a fall in the summer of 2021. Prior to this, she was having little memory changes, "normal aging," however after the fall where she hit her head, she was very out of it, unable to do things such as bathing or cooking. She would put things in different places, such as food under the cabinet where pots and pans go. Over the past year, she would have good and bad days, some days she is a  little disoriented where [name] would come and she would have no pants on, or 2 shirts on top of each other. There are things everywhere, she pulls everything out, taking everything out in her bedroom or kitchen. She says she likes to stay busy, [name] reports it is because she is always piddling around, even at night. Her family fixes her pillbox and her husband would give her medications. She says she is driving, her daughter states she stopped driving years ago. She has burned food on the stove. Family manages finances. They have an aide coming 2 hours 3 days a week, [name] checks on them regularly, along with their neighbor. There is no family history of dementia. She drinks a cocktail on the weekend occasionally with friends. She had a fall where she hit her head and "acted like having a seizure," she was brought to the ER in 12/2019, I personally reviewed head CT without contrast which did not show any acute changes, there was mild diffuse atrophy, chronic microvascular disease. [name] reports her eyes went to the back of her head and she was shaking, leading to a  fall. She had a similar event in 03/2020 where she called out to her husband and he came to find her shaking, he tried to help and they both fell on the ground. Head CT again no changes. There is no report of seizures in both ER visits. [name] denies any staring/unresponsive episodes or myoclonic jerks.   She denies any headaches, dizziness, diplopia, dysarthria/dysphagia, neck pain, focal numbness/tingling/weakness, anosmia. She has low back pain. She wears Depends and has urinary incontinence. There were a few bouts of bowel incontinence where [name] thinks she took her Depends off before getting to the bathroom and soiled the floor, none in a while. She is on Donepezil 5mg  daily without side effects. She sleeps "day and night," and likes to watch soap operas. Mood is great, she likes to have fun. No paranoia or hallucinations.      PREVIOUS  MEDICATIONS:   CURRENT MEDICATIONS:  Outpatient Encounter Medications as of 03/13/2021  Medication Sig   acetaminophen (TYLENOL) 500 MG tablet Take 500 mg by mouth every 6 (six) hours as needed.    brimonidine-timolol (COMBIGAN) 0.2-0.5 % ophthalmic solution Place 1 drop into both eyes every 12 (twelve) hours.   donepezil (ARICEPT) 5 MG tablet TAKE 1 TABLET(5 MG) BY MOUTH AT BEDTIME   ELIQUIS 2.5 MG TABS tablet TAKE 1 TABLET BY MOUTH TWICE DAILY   furosemide (LASIX) 20 MG tablet TAKE HALF TABLET BY MOUTH EVERY MONDAY, WEDNESDAY, AND FRIDAY   influenza vaccine adjuvanted (FLUAD QUADRIVALENT) 0.5 ML injection Fluad Quad 2020-2021(53yr up)(PF) 60 mcg (15 mcg x 4)/0.37mL IM syringe  ADM 0.5ML IM UTD   LUMIGAN 0.01 % SOLN Place 1 drop into both eyes at bedtime.   memantine (NAMENDA) 10 MG tablet Take 1/2 tablet (5 mg at night) for 2 weeks, then increase to 1 tablet (10 mg) at night   nepafenac (ILEVRO) 0.3 % ophthalmic suspension Place 1 drop into both eyes 2 (two) times daily.   oxybutynin (DITROPAN XL) 10 MG 24 hr tablet Take 1 tablet (10 mg total) by mouth at bedtime.   [DISCONTINUED] donepezil (ARICEPT) 5 MG tablet Take 5 mg by mouth daily.   [DISCONTINUED] ELIQUIS 2.5 MG TABS tablet Take 2.5 mg by mouth 2 (two) times daily.   [DISCONTINUED] escitalopram (LEXAPRO) 5 MG tablet Take 1 tablet (5 mg total) by mouth daily.   [DISCONTINUED] HYDROcodone-acetaminophen (NORCO/VICODIN) 5-325 MG tablet Take 1-2 tablets by mouth every 6 (six) hours as needed for moderate pain.   [DISCONTINUED] metoprolol tartrate (LOPRESSOR) 25 MG tablet TAKE 1/2 TABLET(12.5 MG) BY MOUTH TWICE DAILY (Patient not taking: Reported on 03/13/2021)   [DISCONTINUED] triamcinolone cream (KENALOG) 0.1 % Apply 1 application topically 2 (two) times daily.   No facility-administered encounter medications on file as of 03/13/2021.     Objective:     PHYSICAL EXAMINATION:    VITALS:   Vitals:   03/13/21 1522  BP: (!) 192/80   Pulse: 68  Resp: 20  SpO2: 100%  Weight: 115 lb (52.2 kg)  Height: 5' 1.5" (1.562 m)    GEN:  The patient appears stated age and is in NAD. HEENT:  Normocephalic, atraumatic.   Neurological examination:  General: NAD, well-groomed, appears stated age.  Wear sunglasses Orientation: The patient is alert. Oriented to person, place and date not to date.  Is Sunday, December 2022 Cranial nerves: There is good facial symmetry.The speech is fluent and clear. No aphasia or dysarthria. Fund of knowledge is reduced. Recent and remote memory impaired attention and  concentration are normal.  Able to name objects and had difficulty repeating phrases.  Delayed recall 0 of 3, unable to draw or to write a sentence.  Hearing is intact to conversational tone.    Sensation: Sensation is intact to light touch throughout Motor: Strength is at least antigravity x4. Tremors: Mild tremors noted to left greater than right in the upper extremities, intention, no cogwheeling.  Able to hold an object without difficulty DTR's+2 on left UE and LE, +3 right UE, +2 right LE.  No Hoffmann sign   No flowsheet data found. MMSE - Mini Mental State Exam 03/14/2021 09/10/2020  Orientation to time 3 4  Orientation to Place 5 3  Registration 3 3  Attention/ Calculation 0 0  Recall 1 3  Language- name 2 objects 2 2  Language- repeat 1 1  Language- follow 3 step command 2 3  Language- read & follow direction 1 1  Write a sentence 0 0  Copy design 0 0  Total score 18 20    No flowsheet data found.     Movement examination: Tone: There is normal tone in the UE/LE Abnormal movements: No myoclonus.  No asterixis.   Coordination:  There is  decremation with RAM's.  Unable to understand the command for finger to nose  Gait and Station: The patient has no difficulty arising out of a deep-seated chair without the use of the hands. The patient's stride length is good, but needs walker to ambulate.  Gait is slightly wide  based with cane, no ataxia       Total time spent on today's visit was minutes, including both face-to-face time and nonface-to-face time. Time included that spent on review of records (prior notes available to me/labs/imaging if pertinent), discussing treatment and goals, answering patient's questions and coordinating care.  Cc:  Nelwyn Salisbury, MD Marlowe Kays, PA-C

## 2021-04-01 ENCOUNTER — Other Ambulatory Visit: Payer: Self-pay | Admitting: Family Medicine

## 2021-04-22 DIAGNOSIS — E119 Type 2 diabetes mellitus without complications: Secondary | ICD-10-CM | POA: Diagnosis not present

## 2021-04-22 DIAGNOSIS — H401133 Primary open-angle glaucoma, bilateral, severe stage: Secondary | ICD-10-CM | POA: Diagnosis not present

## 2021-05-04 DIAGNOSIS — Z880 Allergy status to penicillin: Secondary | ICD-10-CM | POA: Diagnosis not present

## 2021-05-04 DIAGNOSIS — Z7901 Long term (current) use of anticoagulants: Secondary | ICD-10-CM | POA: Diagnosis not present

## 2021-05-04 DIAGNOSIS — Z79899 Other long term (current) drug therapy: Secondary | ICD-10-CM | POA: Diagnosis not present

## 2021-05-04 DIAGNOSIS — S0990XA Unspecified injury of head, initial encounter: Secondary | ICD-10-CM | POA: Diagnosis not present

## 2021-05-04 DIAGNOSIS — W19XXXA Unspecified fall, initial encounter: Secondary | ICD-10-CM | POA: Diagnosis not present

## 2021-05-04 DIAGNOSIS — E785 Hyperlipidemia, unspecified: Secondary | ICD-10-CM | POA: Diagnosis not present

## 2021-05-04 DIAGNOSIS — I480 Paroxysmal atrial fibrillation: Secondary | ICD-10-CM | POA: Diagnosis not present

## 2021-05-04 DIAGNOSIS — I10 Essential (primary) hypertension: Secondary | ICD-10-CM | POA: Diagnosis not present

## 2021-05-04 DIAGNOSIS — E119 Type 2 diabetes mellitus without complications: Secondary | ICD-10-CM | POA: Diagnosis not present

## 2021-05-31 ENCOUNTER — Other Ambulatory Visit (HOSPITAL_COMMUNITY): Payer: Self-pay | Admitting: Nurse Practitioner

## 2021-06-03 ENCOUNTER — Telehealth: Payer: Self-pay

## 2021-06-03 NOTE — Telephone Encounter (Signed)
This nurse called patient in regards to missed appointment. Patient stated she did not know and was about to leave for another appointment at this time. Told her that we will call back to reschedule for another time.

## 2021-06-10 DIAGNOSIS — M4856XA Collapsed vertebra, not elsewhere classified, lumbar region, initial encounter for fracture: Secondary | ICD-10-CM | POA: Diagnosis not present

## 2021-06-10 DIAGNOSIS — M5136 Other intervertebral disc degeneration, lumbar region: Secondary | ICD-10-CM | POA: Diagnosis not present

## 2021-06-10 DIAGNOSIS — R531 Weakness: Secondary | ICD-10-CM | POA: Diagnosis not present

## 2021-06-10 DIAGNOSIS — I480 Paroxysmal atrial fibrillation: Secondary | ICD-10-CM | POA: Diagnosis not present

## 2021-06-10 DIAGNOSIS — R4182 Altered mental status, unspecified: Secondary | ICD-10-CM | POA: Diagnosis not present

## 2021-06-10 DIAGNOSIS — G301 Alzheimer's disease with late onset: Secondary | ICD-10-CM | POA: Diagnosis not present

## 2021-06-10 DIAGNOSIS — M545 Low back pain, unspecified: Secondary | ICD-10-CM | POA: Diagnosis not present

## 2021-06-10 DIAGNOSIS — I10 Essential (primary) hypertension: Secondary | ICD-10-CM | POA: Diagnosis not present

## 2021-06-10 DIAGNOSIS — I700 Atherosclerosis of aorta: Secondary | ICD-10-CM | POA: Diagnosis not present

## 2021-06-10 DIAGNOSIS — F02B0 Dementia in other diseases classified elsewhere, moderate, without behavioral disturbance, psychotic disturbance, mood disturbance, and anxiety: Secondary | ICD-10-CM | POA: Diagnosis not present

## 2021-06-10 DIAGNOSIS — R9082 White matter disease, unspecified: Secondary | ICD-10-CM | POA: Diagnosis not present

## 2021-06-10 DIAGNOSIS — E119 Type 2 diabetes mellitus without complications: Secondary | ICD-10-CM | POA: Diagnosis not present

## 2021-06-10 DIAGNOSIS — M4850XA Collapsed vertebra, not elsewhere classified, site unspecified, initial encounter for fracture: Secondary | ICD-10-CM | POA: Diagnosis not present

## 2021-06-10 DIAGNOSIS — S32038A Other fracture of third lumbar vertebra, initial encounter for closed fracture: Secondary | ICD-10-CM | POA: Diagnosis not present

## 2021-06-10 DIAGNOSIS — S22000A Wedge compression fracture of unspecified thoracic vertebra, initial encounter for closed fracture: Secondary | ICD-10-CM | POA: Diagnosis not present

## 2021-06-10 DIAGNOSIS — M47816 Spondylosis without myelopathy or radiculopathy, lumbar region: Secondary | ICD-10-CM | POA: Diagnosis not present

## 2021-06-10 DIAGNOSIS — R410 Disorientation, unspecified: Secondary | ICD-10-CM | POA: Diagnosis not present

## 2021-06-10 DIAGNOSIS — Z20822 Contact with and (suspected) exposure to covid-19: Secondary | ICD-10-CM | POA: Diagnosis not present

## 2021-06-10 DIAGNOSIS — S32018A Other fracture of first lumbar vertebra, initial encounter for closed fracture: Secondary | ICD-10-CM | POA: Diagnosis not present

## 2021-06-11 ENCOUNTER — Telehealth: Payer: Self-pay | Admitting: Family Medicine

## 2021-06-11 NOTE — Telephone Encounter (Signed)
Left message for patient to call back and schedule Medicare Annual Wellness Visit (AWV) either virtually or in office. Left  my Zachery Conch number [phone] ? ? ?Last AWV 07/05/20 ? please schedule at anytime with [facility] Nurse Health Advisor 1 or 2 ? ? ?This should be a 45 minute visit.  ? ?Awv can be schedule calendar year [employer] ?

## 2021-06-12 DIAGNOSIS — S22000A Wedge compression fracture of unspecified thoracic vertebra, initial encounter for closed fracture: Secondary | ICD-10-CM | POA: Diagnosis not present

## 2021-06-13 DIAGNOSIS — H409 Unspecified glaucoma: Secondary | ICD-10-CM | POA: Diagnosis not present

## 2021-06-13 DIAGNOSIS — R4182 Altered mental status, unspecified: Secondary | ICD-10-CM | POA: Diagnosis not present

## 2021-06-13 DIAGNOSIS — Z7901 Long term (current) use of anticoagulants: Secondary | ICD-10-CM | POA: Diagnosis not present

## 2021-06-13 DIAGNOSIS — S22000A Wedge compression fracture of unspecified thoracic vertebra, initial encounter for closed fracture: Secondary | ICD-10-CM | POA: Diagnosis not present

## 2021-06-13 DIAGNOSIS — I10 Essential (primary) hypertension: Secondary | ICD-10-CM | POA: Diagnosis not present

## 2021-06-13 DIAGNOSIS — I4891 Unspecified atrial fibrillation: Secondary | ICD-10-CM | POA: Diagnosis not present

## 2021-06-13 DIAGNOSIS — R922 Inconclusive mammogram: Secondary | ICD-10-CM | POA: Diagnosis not present

## 2021-06-13 DIAGNOSIS — M545 Low back pain, unspecified: Secondary | ICD-10-CM | POA: Diagnosis not present

## 2021-06-13 DIAGNOSIS — F02B0 Dementia in other diseases classified elsewhere, moderate, without behavioral disturbance, psychotic disturbance, mood disturbance, and anxiety: Secondary | ICD-10-CM | POA: Diagnosis not present

## 2021-06-13 DIAGNOSIS — M4850XA Collapsed vertebra, not elsewhere classified, site unspecified, initial encounter for fracture: Secondary | ICD-10-CM | POA: Diagnosis not present

## 2021-06-13 DIAGNOSIS — E119 Type 2 diabetes mellitus without complications: Secondary | ICD-10-CM | POA: Diagnosis not present

## 2021-06-13 DIAGNOSIS — S32000D Wedge compression fracture of unspecified lumbar vertebra, subsequent encounter for fracture with routine healing: Secondary | ICD-10-CM | POA: Diagnosis not present

## 2021-06-13 DIAGNOSIS — K5900 Constipation, unspecified: Secondary | ICD-10-CM | POA: Diagnosis not present

## 2021-06-13 DIAGNOSIS — E785 Hyperlipidemia, unspecified: Secondary | ICD-10-CM | POA: Diagnosis not present

## 2021-06-13 DIAGNOSIS — R413 Other amnesia: Secondary | ICD-10-CM | POA: Diagnosis not present

## 2021-06-13 DIAGNOSIS — F0390 Unspecified dementia without behavioral disturbance: Secondary | ICD-10-CM | POA: Diagnosis not present

## 2021-06-13 DIAGNOSIS — R627 Adult failure to thrive: Secondary | ICD-10-CM | POA: Diagnosis not present

## 2021-06-13 DIAGNOSIS — I480 Paroxysmal atrial fibrillation: Secondary | ICD-10-CM | POA: Diagnosis not present

## 2021-06-13 DIAGNOSIS — M4856XA Collapsed vertebra, not elsewhere classified, lumbar region, initial encounter for fracture: Secondary | ICD-10-CM | POA: Diagnosis not present

## 2021-06-13 DIAGNOSIS — R55 Syncope and collapse: Secondary | ICD-10-CM | POA: Diagnosis not present

## 2021-06-13 DIAGNOSIS — R296 Repeated falls: Secondary | ICD-10-CM | POA: Diagnosis not present

## 2021-06-13 DIAGNOSIS — M6281 Muscle weakness (generalized): Secondary | ICD-10-CM | POA: Diagnosis not present

## 2021-06-13 DIAGNOSIS — G301 Alzheimer's disease with late onset: Secondary | ICD-10-CM | POA: Diagnosis not present

## 2021-06-17 ENCOUNTER — Other Ambulatory Visit: Payer: Self-pay | Admitting: Family Medicine

## 2021-06-18 DIAGNOSIS — F0390 Unspecified dementia without behavioral disturbance: Secondary | ICD-10-CM | POA: Diagnosis not present

## 2021-06-18 DIAGNOSIS — Z7901 Long term (current) use of anticoagulants: Secondary | ICD-10-CM | POA: Diagnosis not present

## 2021-06-18 DIAGNOSIS — I4891 Unspecified atrial fibrillation: Secondary | ICD-10-CM | POA: Diagnosis not present

## 2021-06-18 DIAGNOSIS — R296 Repeated falls: Secondary | ICD-10-CM | POA: Diagnosis not present

## 2021-06-19 DIAGNOSIS — R627 Adult failure to thrive: Secondary | ICD-10-CM | POA: Diagnosis not present

## 2021-06-19 DIAGNOSIS — M6281 Muscle weakness (generalized): Secondary | ICD-10-CM | POA: Diagnosis not present

## 2021-06-19 DIAGNOSIS — S32000D Wedge compression fracture of unspecified lumbar vertebra, subsequent encounter for fracture with routine healing: Secondary | ICD-10-CM | POA: Diagnosis not present

## 2021-06-19 DIAGNOSIS — F0390 Unspecified dementia without behavioral disturbance: Secondary | ICD-10-CM | POA: Diagnosis not present

## 2021-06-19 DIAGNOSIS — I4891 Unspecified atrial fibrillation: Secondary | ICD-10-CM | POA: Diagnosis not present

## 2021-06-19 DIAGNOSIS — I10 Essential (primary) hypertension: Secondary | ICD-10-CM | POA: Diagnosis not present

## 2021-06-23 ENCOUNTER — Telehealth: Payer: Self-pay | Admitting: Family Medicine

## 2021-06-23 NOTE — Telephone Encounter (Signed)
Ok for verbal orders ?

## 2021-06-23 NOTE — Telephone Encounter (Signed)
Tasia Catchings with [facility] called in requesting if they could continue PT with patient for 3 times a week for 1 week and 2 times a week for 4 weeks after that (strengthening gate training balance). ? ?Tasia Catchings could be contacted at [phone]. ? ?Please advise. ?

## 2021-06-24 NOTE — Telephone Encounter (Signed)
Left detailed message for [name] with approved orders for PT ?

## 2021-06-24 NOTE — Telephone Encounter (Signed)
Please okay these orders  ?

## 2021-06-26 ENCOUNTER — Telehealth: Payer: Self-pay

## 2021-06-26 NOTE — Telephone Encounter (Signed)
Representative called asking for immunizations record to be faxed to  ?[phone] attn: Idell Pickles  ?

## 2021-07-04 NOTE — Telephone Encounter (Signed)
Spoke with [name] with [facility], stated that he received pt verbal orders approved  by Dr Clent Ridges ?

## 2021-07-04 NOTE — Telephone Encounter (Signed)
Attempted to fax immunization records twice but fax not going through ?

## 2021-07-09 NOTE — Telephone Encounter (Signed)
Immunizations faxed as requested

## 2021-07-14 ENCOUNTER — Telehealth: Payer: Self-pay | Admitting: Family Medicine

## 2021-07-14 NOTE — Telephone Encounter (Signed)
Left message for patient to call back and schedule Medicare Annual Wellness Visit (AWV) either virtually or in office. Left  my Herbie Drape number [phone] ? ? ?Last AWV ;07/05/20 ? please schedule at anytime with [facility] Nurse Health Advisor 1 or 2 ? ? ? ?

## 2021-07-15 ENCOUNTER — Ambulatory Visit (INDEPENDENT_AMBULATORY_CARE_PROVIDER_SITE_OTHER): Payer: Medicare PPO

## 2021-07-15 VITALS — Ht 61.5 in | Wt 125.0 lb

## 2021-07-15 DIAGNOSIS — Z0000 Encounter for general adult medical examination without abnormal findings: Secondary | ICD-10-CM | POA: Diagnosis not present

## 2021-07-15 NOTE — Progress Notes (Signed)
?I connected with [name] today by telephone and verified that I am speaking with the correct person using two identifiers. ?Location patient: home ?Location provider: work ?Persons participating in the virtual visit: [name], Sirek LPN. ?  ?I discussed the limitations, risks, security and privacy concerns of performing an evaluation and management service by telephone and the availability of in person appointments. I also discussed with the patient that there may be a patient responsible charge related to this service. The patient expressed understanding and verbally consented to this telephonic visit.  ?  ?Interactive audio and video telecommunications were attempted between this provider and patient, however failed, due to patient having technical difficulties OR patient did not have access to video capability.  We continued and completed visit with audio only. ? ?  ? ?Vital signs may be patient reported or missing. ? ?Subjective:  ? [name] is a 88 y.o. female who presents for Medicare Annual (Subsequent) preventive examination. ? ?Review of Systems    ? ?Cardiac Risk Factors include: advanced age (>55men, >40 women);diabetes mellitus;dyslipidemia;hypertension ? ?   ?Objective:  ?  ?Today's Vitals  ? 07/15/21 1327  ?Weight: 125 lb (56.7 kg)  ?Height: 5' 1.5" (1.562 m)  ? ?Body mass index is 23.24 kg/m?. ? ? ?  07/15/2021  ?  1:35 PM 03/13/2021  ?  3:23 PM 10/20/2020  ? 12:01 AM 09/10/2020  ? 10:38 AM 07/05/2020  ? 10:05 AM 01/05/2020  ?  8:53 PM 01/05/2020  ?  5:29 PM  ?Advanced Directives  ?Does Patient Have a Medical Advance Directive? Yes No No No Yes No No  ?Type of Estate agent of [place];Living will        ?Does patient want to make changes to medical advance directive?     No - Patient declined    ?Copy of Healthcare Power of Attorney in Chart? No - copy requested        ?Would patient like information on creating a medical advance directive?   No - Patient  declined   No - Patient declined No - Patient declined  ? ? ?Current Medications (verified) ?Outpatient Encounter Medications as of 07/15/2021  ?Medication Sig  ? acetaminophen (TYLENOL) 500 MG tablet Take 500 mg by mouth every 6 (six) hours as needed.   ? brimonidine-timolol (COMBIGAN) 0.2-0.5 % ophthalmic solution Place 1 drop into both eyes every 12 (twelve) hours.  ? donepezil (ARICEPT) 5 MG tablet TAKE 1 TABLET(5 MG) BY MOUTH AT BEDTIME  ? ELIQUIS 2.5 MG TABS tablet TAKE 1 TABLET BY MOUTH TWICE DAILY  ? furosemide (LASIX) 20 MG tablet TAKE 1/2 TABLET BY MOUTH EVERY MONDAY,WED AND FRIDAY  ? influenza vaccine adjuvanted (FLUAD QUADRIVALENT) 0.5 ML injection Fluad Quad 2020-2021(95yr up)(PF) 60 mcg (15 mcg x 4)/0.46mL IM syringe ? ADM 0.5ML IM UTD  ? LUMIGAN 0.01 % SOLN Place 1 drop into both eyes at bedtime.  ? memantine (NAMENDA) 10 MG tablet Take 1/2 tablet (5 mg at night) for 2 weeks, then increase to 1 tablet (10 mg) at night  ? nepafenac (ILEVRO) 0.3 % ophthalmic suspension Place 1 drop into both eyes 2 (two) times daily.  ? oxybutynin (DITROPAN-XL) 10 MG 24 hr tablet TAKE 1 TABLET(10 MG) BY MOUTH AT BEDTIME  ? ?No facility-administered encounter medications on file as of 07/15/2021.  ? ? ?Allergies (verified) ?Penicillins and Benadryl [diphenhydramine]  ? ?History: ?Past Medical History:  ?Diagnosis Date  ? Diabetes mellitus without complication (HCC)   ?  Diverticulitis   ? Glaucoma   ? Hypercholesterolemia   ? Hypertension   ? PAF (paroxysmal atrial fibrillation) (HCC)   ? sees Rudi Cocoonna Carroll NP   ? Shingles   ? ?Past Surgical History:  ?Procedure Laterality Date  ? ABDOMINAL HYSTERECTOMY    ? COLONOSCOPY    ? per Dr. Elnoria HowardHung   ? TOTAL HIP ARTHROPLASTY  2005  ? left sided  ? ?Family History  ?Problem Relation Age of Onset  ? CAD Father   ? ?Social History  ? ?Socioeconomic History  ? Marital status: Married  ?  Spouse name: Not on file  ? Number of children: 2  ? Years of education: 6012  ? Highest education level:  Not on file  ?Occupational History  ? Not on file  ?Tobacco Use  ? Smoking status: Never  ? Smokeless tobacco: Never  ?Vaping Use  ? Vaping Use: Never used  ?Substance and Sexual Activity  ? Alcohol use: Never  ?  Comment: occassionally  ? Drug use: Never  ? Sexual activity: Not Currently  ?Other Topics Concern  ? Not on file  ?Social History Narrative  ? ** Merged History Encounter **  ?    ? Right handed ?Drinks caffeine  ?One story home  ? ?Social Determinants of Health  ? ?Financial Resource Strain: Low Risk   ? Difficulty of Paying Living Expenses: Not hard at all  ?Food Insecurity: No Food Insecurity  ? Worried About Programme researcher, broadcasting/film/videounning Out of Food in the Last Year: Never true  ? Ran Out of Food in the Last Year: Never true  ?Transportation Needs: No Transportation Needs  ? Lack of Transportation (Medical): No  ? Lack of Transportation (Non-Medical): No  ?Physical Activity: Sufficiently Active  ? Days of Exercise per Week: 2 days  ? Minutes of Exercise per Session: 120 min  ?Stress: No Stress Concern Present  ? Feeling of Stress : Not at all  ?Social Connections: Not on file  ? ? ?Tobacco Counseling ?Counseling given: Not Answered ? ? ?Clinical Intake: ? ?Pre-visit preparation completed: Yes ? ?Pain : No/denies pain ? ?  ? ?Nutritional Status: BMI of 19-24  Normal ?Nutritional Risks: None ?Diabetes: Yes ? ?How often do you need to have someone help you when you read instructions, pamphlets, or other written materials from your doctor or pharmacy?: 1 - Never ? ?Diabetic? Yes ?Nutrition Risk Assessment: ? ?Has the patient had any N/V/D within the last 2 months?  No  ?Does the patient have any non-healing wounds?  No  ?Has the patient had any unintentional weight loss or weight gain?  No  ? ?Diabetes: ? ?Is the patient diabetic?  Yes  ?If diabetic, was a CBG obtained today?  No  ?Did the patient bring in their glucometer from home?  No  ?How often do you monitor your CBG's? Once every two weeks.  ? ?Financial Strains and  Diabetes Management: ? ?Are you having any financial strains with the device, your supplies or your medication? No .  ?Does the patient want to be seen by Chronic Care Management for management of their diabetes?  No  ?Would the patient like to be referred to a Nutritionist or for Diabetic Management?  No  ? ?Diabetic Exams: ? ?Diabetic Eye Exam: Overdue for diabetic eye exam. Pt has been advised about the importance in completing this exam. Patient advised to call and schedule an eye exam. ?Diabetic Foot Exam: Overdue, Pt has been advised about the importance in completing  this exam. Pt is scheduled for diabetic foot exam on next appointment. ? ? ?Interpreter Needed?: No ? ?Information entered by :: NAllen LPN ? ? ?Activities of Daily Living ? ?  07/15/2021  ?  1:38 PM 10/20/2020  ? 12:01 AM  ?In your present state of health, do you have any difficulty performing the following activities:  ?Hearing? 0 0  ?Vision? 0 0  ?Difficulty concentrating or making decisions? 0 0  ?Walking or climbing stairs? 0 1  ?Dressing or bathing? 0 1  ?Doing errands, shopping? 1 1  ?Comment does not drive   ?Preparing Food and eating ? Y   ?Using the Toilet? N   ?In the past six months, have you accidently leaked urine? Y   ?Do you have problems with loss of bowel control? Y   ?Managing your Medications? Y   ?Managing your Finances? Y   ?Housekeeping or managing your Housekeeping? Y   ? ? ?Patient Care Team: ?Nelwyn Salisbury, MD as PCP - General (Family Medicine) ?Nelwyn Salisbury, MD (Family Medicine) ? ?Indicate any recent Medical Services you may have received from other than [facility] providers in the past year (date may be approximate). ? ?   ?Assessment:  ? This is a routine wellness examination for [name]. ? ?Hearing/Vision screen ?Vision Screening - Comments:: Regular eye exams, Dr. Harlon Flor ? ?Dietary issues and exercise activities discussed: ?Current Exercise Habits: Home exercise routine, Type of exercise: Other - see comments (physical  therapy), Time (Minutes): > 60, Frequency (Times/Week): 2, Weekly Exercise (Minutes/Week): 0 ? ? Goals Addressed   ? ?  ?  ?  ?  ? This Visit's Progress  ?  Patient Stated     ?  07/15/2021, to get stronger, eat h

## 2021-07-15 NOTE — Patient Instructions (Signed)
[name] , ?Thank you for taking time to come for your Medicare Wellness Visit. I appreciate your ongoing commitment to your health goals. Please review the following plan we discussed and let me know if I can assist you in the future.  ? ?Screening recommendations/referrals: ?Colonoscopy: not required ?Mammogram: not required ?Bone Density: due ?Recommended yearly ophthalmology/optometry visit for glaucoma screening and checkup ?Recommended yearly dental visit for hygiene and checkup ? ?Vaccinations: ?Influenza vaccine: due next flu season ?Pneumococcal vaccine: due ?Tdap vaccine: due ?Shingles vaccine: discussed   ?Covid-19: 05/23/2019, 05/02/2019 ? ?Advanced directives: Please bring a copy of your POA (Power of Attorney) and/or Living Will to your next appointment.  ? ?Conditions/risks identified: none ? ?Next appointment: Follow up in one year for your annual wellness visit  ? ? ?Preventive Care 4 Years and Older, Female ?Preventive care refers to lifestyle choices and visits with your health care provider that can promote health and wellness. ?What does preventive care include? ?A yearly physical exam. This is also called an annual well check. ?Dental exams once or twice a year. ?Routine eye exams. Ask your health care provider how often you should have your eyes checked. ?Personal lifestyle choices, including: ?Daily care of your teeth and gums. ?Regular physical activity. ?Eating a healthy diet. ?Avoiding tobacco and drug use. ?Limiting alcohol use. ?Practicing safe sex. ?Taking low-dose aspirin every day. ?Taking vitamin and mineral supplements as recommended by your health care provider. ?What happens during an annual well check? ?The services and screenings done by your health care provider during your annual well check will depend on your age, overall health, lifestyle risk factors, and family history of disease. ?Counseling  ?Your health care provider may ask you questions about your: ?Alcohol  use. ?Tobacco use. ?Drug use. ?Emotional well-being. ?Home and relationship well-being. ?Sexual activity. ?Eating habits. ?History of falls. ?Memory and ability to understand (cognition). ?Work and work Astronomer. ?Reproductive health. ?Screening  ?You may have the following tests or measurements: ?Height, weight, and BMI. ?Blood pressure. ?Lipid and cholesterol levels. These may be checked every 5 years, or more frequently if you are over 69 years old. ?Skin check. ?Lung cancer screening. You may have this screening every year starting at age 46 if you have a 30-pack-year history of smoking and currently smoke or have quit within the past 15 years. ?Fecal occult blood test (FOBT) of the stool. You may have this test every year starting at age 73. ?Flexible sigmoidoscopy or colonoscopy. You may have a sigmoidoscopy every 5 years or a colonoscopy every 10 years starting at age 89. ?Hepatitis C blood test. ?Hepatitis B blood test. ?Sexually transmitted disease (STD) testing. ?Diabetes screening. This is done by checking your blood sugar (glucose) after you have not eaten for a while (fasting). You may have this done every 1-3 years. ?Bone density scan. This is done to screen for osteoporosis. You may have this done starting at age 47. ?Mammogram. This may be done every 1-2 years. Talk to your health care provider about how often you should have regular mammograms. ?Talk with your health care provider about your test results, treatment options, and if necessary, the need for more tests. ?Vaccines  ?Your health care provider may recommend certain vaccines, such as: ?Influenza vaccine. This is recommended every year. ?Tetanus, diphtheria, and acellular pertussis (Tdap, Td) vaccine. You may need a Td booster every 10 years. ?Zoster vaccine. You may need this after age 68. ?Pneumococcal 13-valent conjugate (PCV13) vaccine. One dose is recommended after age  65. ?Pneumococcal polysaccharide (PPSV23) vaccine. One dose is  recommended after age 37. ?Talk to your health care provider about which screenings and vaccines you need and how often you need them. ?This information is not intended to replace advice given to you by your health care provider. Make sure you discuss any questions you have with your health care provider. ?Document Released: 04/26/2015 Document Revised: 12/18/2015 Document Reviewed: 01/29/2015 ?Elsevier Interactive Patient Education ? 2017 [place]. ? ?Fall Prevention in the Home ?Falls can cause injuries. They can happen to people of all ages. There are many things you can do to make your home safe and to help prevent falls. ?What can I do on the outside of my home? ?Regularly fix the edges of walkways and driveways and fix any cracks. ?Remove anything that might make you trip as you walk through a door, such as a raised step or threshold. ?Trim any bushes or trees on the path to your home. ?Use bright outdoor lighting. ?Clear any walking paths of anything that might make someone trip, such as rocks or tools. ?Regularly check to see if handrails are loose or broken. Make sure that both sides of any steps have handrails. ?Any raised decks and porches should have guardrails on the edges. ?Have any leaves, snow, or ice cleared regularly. ?Use sand or salt on walking paths during winter. ?Clean up any spills in your garage right away. This includes oil or grease spills. ?What can I do in the bathroom? ?Use night lights. ?Install grab bars by the toilet and in the tub and shower. Do not use towel bars as grab bars. ?Use non-skid mats or decals in the tub or shower. ?If you need to sit down in the shower, use a plastic, non-slip stool. ?Keep the floor dry. Clean up any water that spills on the floor as soon as it happens. ?Remove soap buildup in the tub or shower regularly. ?Attach bath mats securely with double-sided non-slip rug tape. ?Do not have throw rugs and other things on the floor that can make you  trip. ?What can I do in the bedroom? ?Use night lights. ?Make sure that you have a light by your bed that is easy to reach. ?Do not use any sheets or blankets that are too big for your bed. They should not hang down onto the floor. ?Have a firm chair that has side arms. You can use this for support while you get dressed. ?Do not have throw rugs and other things on the floor that can make you trip. ?What can I do in the kitchen? ?Clean up any spills right away. ?Avoid walking on wet floors. ?Keep items that you use a lot in easy-to-reach places. ?If you need to reach something above you, use a strong step stool that has a grab bar. ?Keep electrical cords out of the way. ?Do not use floor polish or wax that makes floors slippery. If you must use wax, use non-skid floor wax. ?Do not have throw rugs and other things on the floor that can make you trip. ?What can I do with my stairs? ?Do not leave any items on the stairs. ?Make sure that there are handrails on both sides of the stairs and use them. Fix handrails that are broken or loose. Make sure that handrails are as long as the stairways. ?Check any carpeting to make sure that it is firmly attached to the stairs. Fix any carpet that is loose or worn. ?Avoid having throw rugs at  the top or bottom of the stairs. If you do have throw rugs, attach them to the floor with carpet tape. ?Make sure that you have a light switch at the top of the stairs and the bottom of the stairs. If you do not have them, ask someone to add them for you. ?What else can I do to help prevent falls? ?Wear shoes that: ?Do not have high heels. ?Have rubber bottoms. ?Are comfortable and fit you well. ?Are closed at the toe. Do not wear sandals. ?If you use a stepladder: ?Make sure that it is fully opened. Do not climb a closed stepladder. ?Make sure that both sides of the stepladder are locked into place. ?Ask someone to hold it for you, if possible. ?Clearly mark and make sure that you can  see: ?Any grab bars or handrails. ?First and last steps. ?Where the edge of each step is. ?Use tools that help you move around (mobility aids) if they are needed. These include: ?Canes. ?Walkers. ?Scooters. ?Crutches. ?Turn on the

## 2021-07-23 DIAGNOSIS — H401133 Primary open-angle glaucoma, bilateral, severe stage: Secondary | ICD-10-CM | POA: Diagnosis not present

## 2021-07-23 DIAGNOSIS — H35033 Hypertensive retinopathy, bilateral: Secondary | ICD-10-CM | POA: Diagnosis not present

## 2021-07-23 DIAGNOSIS — E119 Type 2 diabetes mellitus without complications: Secondary | ICD-10-CM | POA: Diagnosis not present

## 2021-07-23 DIAGNOSIS — H43813 Vitreous degeneration, bilateral: Secondary | ICD-10-CM | POA: Diagnosis not present

## 2021-09-12 ENCOUNTER — Ambulatory Visit: Payer: Medicare PPO | Admitting: Physician Assistant

## 2021-09-24 ENCOUNTER — Encounter (HOSPITAL_COMMUNITY): Payer: Self-pay | Admitting: Nurse Practitioner

## 2021-09-24 ENCOUNTER — Ambulatory Visit (HOSPITAL_COMMUNITY)
Admission: RE | Admit: 2021-09-24 | Discharge: 2021-09-24 | Disposition: A | Discharge status: Home / Self Care | Payer: Medicare PPO | Source: Ambulatory Visit | Attending: Nurse Practitioner | Admitting: Nurse Practitioner

## 2021-09-24 VITALS — BP 126/70 | HR 73 | Ht 61.5 in | Wt 111.8 lb

## 2021-09-24 DIAGNOSIS — Z7901 Long term (current) use of anticoagulants: Secondary | ICD-10-CM | POA: Diagnosis not present

## 2021-09-24 DIAGNOSIS — Z9181 History of falling: Secondary | ICD-10-CM | POA: Insufficient documentation

## 2021-09-24 DIAGNOSIS — I480 Paroxysmal atrial fibrillation: Secondary | ICD-10-CM | POA: Diagnosis not present

## 2021-09-24 DIAGNOSIS — R55 Syncope and collapse: Secondary | ICD-10-CM | POA: Insufficient documentation

## 2021-09-24 DIAGNOSIS — I4892 Unspecified atrial flutter: Secondary | ICD-10-CM | POA: Diagnosis not present

## 2021-09-24 DIAGNOSIS — K5790 Diverticulosis of intestine, part unspecified, without perforation or abscess without bleeding: Secondary | ICD-10-CM | POA: Insufficient documentation

## 2021-09-24 DIAGNOSIS — I484 Atypical atrial flutter: Secondary | ICD-10-CM

## 2021-09-24 DIAGNOSIS — D6869 Other thrombophilia: Secondary | ICD-10-CM

## 2021-09-24 NOTE — Progress Notes (Signed)
Primary Care Physician: Laurey Morale, MD Referring Physician: [facility] f/u    [name] is a 88 y.o. female with a h/o recent  hospitalization for diverticula bleed. She was noted to by ED RN to have syncope with HR in the 30's while having profuse rectal bleeding with clots. She also had some SVT on monitor and left with a holter monitor. This did show paroxysmal afib and she is now in the afib clinic to further discuss. She states that her bleeding spontaneously resolved and she was told she would not need a f/u colonoscopy. She is now in need of anticoagulation with new onset afib and with a chadsvasc score of 4. She is in SR today and was asymptomatic with afib on monitor. Her BB was increased when afib was found. She is chronically anemic with plt's around 120.   F/u in afib clinic 10/28. She was started on anticoagulation after hearing back from Dr. Benson Norway that it was reasonable to start and so far she is doing well. No signs of bleeding. No awareness of afib.  F/u in afib clinic, 11/19. Monitor reviewed that pt wore back in September that showed mostly sinus rhythm with low afib burden; She is in SR today but with a HR of 41 bpm, she is asymptomatic.No bleeding issues with eliquis, cbc done recently showed normal H/H. She had an echo done today, results pending.  F/u hospitalization in afib clinic 1/29. Pt has hypotension and dizziness and fell backward. She and her husband states that she did not pass out. Her HR is in the upper 40's with reduced BB dose to just once a day. She is not symptomatic with brady. She was in SR during her hospitalization.  Her BP's at home, reviewed today look in range without any low readings. They have not been recording the HR's.    F/u in afib clinic 06/13/19. She feels great but her EKG looks like an  atrial flutter with v rates around 100 bpm. She is asymptomatic. She does not think that she is on BB, for some reason it is not on her med list, but pharmacy  reports that she did pick up lopressor the end of January. It was a 90 day supply.   F/u in afib clinic, 06/22/19. She remains in atrial flutter but rate controlled now. She does confirm that she is taking BB. She  is asymptomatic other than noting some pedal edema. Her weight may be up 1-2 lbs. She  has had both covid shots now in the last month.  F/u in afib clinic, 07/06/19. She  is back in rhythm. Lasix did not help with her LLE.  F/u 4/22. Stopping amlodipine did help with her LLE plus low dose furosemide. Echo showed normal EF. She  is in rate controlled flutter today, she is completely   asymptomatic. Appears to be paroxysmal as she was in rhythm on last visit.  She denies any fatigue, shortness of breath with exertion, lightheaded.  F/u in afib clinic, 9/17. She had a diverticular bleed 12/03/19,treated at [facility]. HGB dropped  to 10.3 with HGB back to 11.0 8/24, she held her anticoagulation x one week and restarted . She  has not noted any further bleeding. She is in SR today. Has had some bilateral LLE , but  has not taken her prn lasix. Otherwise, no complaints. She  had labs drawn with her PCP right after hospitalization and has another appointment 10/5 for f/u.   F/u in  afib clinic, 07/10/20. She is in SR. She is here with her daughters at her husband is getting too feeble to bring her in. In fact, the daughters reprot that [name] is also getting more feeble and has had some falls. She usually does not fall using her cane in the house but she either forgets to use it or on purpose forgets it. For now do not think I need to stop her anticoagulation but had that discussion with the daughters that if frequent falls continue we may have to  discuss again. She has chronic LLE, has lasix to take but sits in a chair with her feet dangling all day and does not wear any compression socks.   F/u in afib clinic, 03/13/21. Since I saw pt last in March, she has had 2 falls. In July, she suffered a  mechanical fall and suffered a rt hip perioperative fracture, non surgical treatment was recommended. Marland Kitchen She was in the hospital 7/9 to 10/28/20. She then had a fall 11/28 and presented to the ER. Daughter states that she has had multiple falls.  There was a new T4 compression fracture approximately 10%.  Old L1 compression fracture.  Mild T10 and T12 compression fractures unchanged from prior.she ab=nd her husband are still living at home, but pt has  signs of dementia. She has two daughters that are active to overseeing their parents  care.   EKG today shows NSR at 61 bpm. She is with her husband and daughter today. She is alert and oriented and very pleasant today. She is using her walker and encouraged to ambulate with that at all times at home.   F/u in afib clinic, 09/24/21. She continues with dementia and intermittent falls.Here with daughter today.  She is in rate controlled atrial flutter today. She continues on eliquis 2.5 mg bid.Has lost a few more lbs. Eats very small amounts.    Today, she denies symptoms of palpitations, chest pain, shortness of breath, orthopnea, PND, lower extremity edema, dizziness, presyncope, syncope, or neurologic sequela. The patient is tolerating medications without difficulties and is otherwise without complaint today.   Past Medical History:  Diagnosis Date   Diabetes mellitus without complication ([place])    Diverticulitis    Glaucoma    Hypercholesterolemia    Hypertension    PAF (paroxysmal atrial fibrillation) ([place])    sees Roderic Palau NP    Shingles    Past Surgical History:  Procedure Laterality Date   ABDOMINAL HYSTERECTOMY     COLONOSCOPY     per Dr. Benson Norway    TOTAL HIP ARTHROPLASTY  2005   left sided    Current Outpatient Medications  Medication Sig Dispense Refill   acetaminophen (TYLENOL) 500 MG tablet Take 500 mg by mouth every 6 (six) hours as needed.      donepezil (ARICEPT) 5 MG tablet TAKE 1 TABLET(5 MG) BY MOUTH AT BEDTIME 90 tablet  0   dorzolamide (TRUSOPT) 2 % ophthalmic solution 1 drop daily.     ELIQUIS 2.5 MG TABS tablet TAKE 1 TABLET BY MOUTH TWICE DAILY 60 tablet 11   furosemide (LASIX) 20 MG tablet TAKE 1/2 TABLET BY MOUTH EVERY MONDAY,WED AND FRIDAY 20 tablet 3   influenza vaccine adjuvanted (FLUAD QUADRIVALENT) 0.5 ML injection Fluad Quad 2020-2021(1yr up)(PF) 60 mcg (15 mcg x 4)/0.90mL IM syringe  ADM 0.5ML IM UTD     LUMIGAN 0.01 % SOLN Place 1 drop into both eyes at bedtime.  5   memantine (NAMENDA)  10 MG tablet Take 1/2 tablet (5 mg at night) for 2 weeks, then increase to 1 tablet (10 mg) at night (Patient taking differently: Take 10 mg by mouth in the morning.) 30 tablet 11   nepafenac (ILEVRO) 0.3 % ophthalmic suspension Place 1 drop into both eyes 2 (two) times daily.     oxybutynin (DITROPAN-XL) 10 MG 24 hr tablet TAKE 1 TABLET(10 MG) BY MOUTH AT BEDTIME 30 tablet 3   brimonidine-timolol (COMBIGAN) 0.2-0.5 % ophthalmic solution Place 1 drop into both eyes every 12 (twelve) hours. (Patient not taking: Reported on 09/24/2021)     No current facility-administered medications for this encounter.    Allergies  Allergen Reactions   Penicillins Anaphylaxis and Other (See Comments)    Did it involve swelling of the face/tongue/throat, SOB, or low BP? Yes Did it involve sudden or severe rash/hives, skin peeling, or any reaction on the inside of your mouth or nose? Yes Did you need to seek medical attention at a hospital or doctor's office? Yes When did it last happen? Unknown If all above answers are "NO", may proceed with cephalosporin use.  Did it involve swelling of the face/tongue/throat, SOB, or low BP? Yes Did it involve sudden or severe rash/hives, skin peeling, or any reaction on the inside of your mouth or nose? Yes Did you need to seek medical attention at a hospital or doctor's office? Yes When did it last happen? Unknown If all above answers are "NO", may proceed with cephalosporin use.     Benadryl [Diphenhydramine] Rash    Social History   Socioeconomic History   Marital status: Married    Spouse name: Not on file   Number of children: 2   Years of education: 12   Highest education level: Not on file  Occupational History   Not on file  Tobacco Use   Smoking status: Never   Smokeless tobacco: Never  Vaping Use   Vaping Use: Never used  Substance and Sexual Activity   Alcohol use: Never    Comment: occassionally   Drug use: Never   Sexual activity: Not Currently  Other Topics Concern   Not on file  Social History Narrative   ** Merged History Encounter **       Right handed Drinks caffeine  One story home   Social Determinants of Health   Financial Resource Strain: Low Risk  (07/15/2021)   Overall Financial Resource Strain (CARDIA)    Difficulty of Paying Living Expenses: Not hard at all  Food Insecurity: No Food Insecurity (07/15/2021)   Hunger Vital Sign    Worried About Running Out of Food in the Last Year: Never true    [place] in the Last Year: Never true  Transportation Needs: No Transportation Needs (07/15/2021)   PRAPARE - Hydrologist (Medical): No    Lack of Transportation (Non-Medical): No  Physical Activity: Sufficiently Active (07/15/2021)   Exercise Vital Sign    Days of Exercise per Week: 2 days    Minutes of Exercise per Session: 120 min  Stress: No Stress Concern Present (07/15/2021)   [place]    Feeling of Stress : Not at all  Social Connections: [place] (07/05/2020)   Social Connection and Isolation Panel [NHANES]    Frequency of Communication with Friends and Family: More than three times a week    Frequency of Social Gatherings with Friends and Family: More  than three times a week    Attends Religious Services: More than 4 times per year    Active Member of Clubs or Organizations: Yes    Attends Archivist  Meetings: More than 4 times per year    Marital Status: Married  Human resources officer Violence: Not At Risk (07/05/2020)   Humiliation, Afraid, Rape, and Kick questionnaire    Fear of Current or Ex-Partner: No    Emotionally Abused: No    Physically Abused: No    Sexually Abused: No    Family History  Problem Relation Age of Onset   CAD Father     ROS- All systems are reviewed and negative except as per the HPI above  Physical Exam: Vitals:   09/24/21 1130  Pulse: 73  Weight: 50.7 kg  Height: 5' 1.5" (1.562 m)   Wt Readings from Last 3 Encounters:  09/24/21 50.7 kg  07/15/21 56.7 kg  03/13/21 52.5 kg    Labs: Lab Results  Component Value Date   NA 141 03/10/2021   K 3.9 03/10/2021   CL 107 03/10/2021   CO2 28 03/10/2021   GLUCOSE 126 (H) 03/10/2021   BUN 18 03/10/2021   CREATININE 0.72 03/10/2021   CALCIUM 9.2 03/10/2021   PHOS 4.0 10/20/2020   MG 1.9 10/20/2020   Lab Results  Component Value Date   INR 1.1 01/05/2020   No results found for: "CHOL", "HDL", "LDLCALC", "TRIG"   GEN- The patient is well appearing, alert and oriented x 3 today.   Head- normocephalic, atraumatic Eyes-  Sclera clear, conjunctiva pink Ears- hearing intact Oropharynx- clear Neck- supple, no JVP Lymph- no cervical lymphadenopathy Lungs- Clear to ausculation bilaterally, normal work of breathing Heart- Regular rate and rhythm, no murmurs, rubs or gallops, PMI not laterally displaced GI- soft, NT, ND, + BS Extremities- no clubbing, cyanosis, or edema MS- no significant deformity or atrophy Skin- no rash or lesion Psych- euthymic mood, full affect Neuro- strength and sensation are intact   Ekg-Vent. rate 73 BPM PR interval * ms QRS duration 82 ms QT/QTcB 372/409 ms P-R-T axes 69 3 88 Atrial flutter with 4:1 A-V conduction Minimal voltage criteria for LVH, may be normal variant ( R in aVL ) Nonspecific ST and T wave abnormality Abnormal ECG When compared with ECG of  13-Mar-2021 14:23, PREVIOUS ECG IS PRESENT   Assessment and Plan:  1. Paroxysmal afib/flutter  She is in rate controlled atrial flutter today but is rate controlled and unaware  Not on rate control for history of bradycardia   2. CHA2DS2VASc  score of 5 Continue  eliquis 2.5 mg bid  Pt and her elderly husband still live in their home for the most part unattended with family checking frequently, daughter states she feels that pt is inconsistent with meds even  with frequent reminders Discussed importance of taking eliquis bid to prevent strokes   3. Fall risk Use of walker recommended anytime pt is  walking She has not had any falls since  late last year  I will see back in 6 months   Butch Penny C. Chrisie Jankovich, [facility] [address] [place], [place] [address] [phone]

## 2021-10-01 ENCOUNTER — Encounter: Payer: Self-pay | Admitting: Physician Assistant

## 2021-10-01 ENCOUNTER — Ambulatory Visit: Payer: Medicare PPO | Admitting: Physician Assistant

## 2021-10-01 DIAGNOSIS — Z029 Encounter for administrative examinations, unspecified: Secondary | ICD-10-CM

## 2021-10-01 NOTE — Progress Notes (Deleted)
Assessment/Plan:   Dementia likely due to    Recommendations:    Continue Donepezil 5 mg daily and Memantine 10 mg bid  Side effects were discussed Use a walker to ambulate Continue 24/7 monitoring Follow up in 6  months.   Case discussed with Dr. Karel Jarvis who agrees with the plan   Moderate Dementia, possibly vascular without behavioral disturbance   Memory slightly worsened, difficulty with remembering to take medications, recent falls with several vertebral compression fractures.  Overall deconditioning, followed up as outpatient, will need PT.  MMSE today 18/30, delayed recall 0/3, unable to spell [place] backwards and unable to draw clock or pentagon or write a sentence. Recent CT head 11/28 without acute abnormalities, age-related atrophy.  Subjective:    [name] is a very pleasant 88 y.o. RH female  seen today in follow up for memory loss. This patient is accompanied in the office by her who supplements the history.  Previous records as well as any outside records available were reviewed prior to todays visit.  Patient was last seen at our office on  at which time her  Patient is currently on   Any changes in memory since last visit? Patient lives with: Spouse who noticed changes as well.  Patient lives alone repeats oneself?  Disoriented when walking into a room?  Patient denies   Leaving objects in unusual places?  Patient denies   Ambulates  with difficulty?   Patient denies   Recent falls?  Patient denies   Any head injuries?  Patient denies   History of seizures?   Patient denies   Wandering behavior?  Patient denies   Patient drives?   Patient no longer drives  Patient drives with assistance  Patient uses [employer] to drive   Any mood changes such irritability agitation?  Patient denies   Any history of depression?:  Patient denies   Hallucinations?  Patient denies   Paranoia?  Patient denies   Patient reports that he sleeps well without vivid dreams, REM  behavior or sleepwalking   Patient reports vivid dreams   History of sleep apnea?  Patient denies   Any hygiene concerns?  Patient denies   Independent of bathing and dressing?  Endorsed  Does the patient needs help with medications?  pillbox pill pack  Patient denies   Who is in charge of the finances?  Patient is in charge   is in charge   assists the patient  and denies missing any bills   occasionally misses a payment. Any changes in appetite?  Patient denies   Patient have trouble swallowing? Patient denies   Does the patient cook?  Patient denies   Any kitchen accidents such as leaving the stove on? Patient denies   Any headaches?  Patient denies   The double vision? Patient denies   Any focal numbness or tingling?  Patient denies   Chronic back pain Patient denies   Unilateral weakness?  Patient denies   Any tremors?  Patient denies   Any history of anosmia?  Patient denies   Any incontinence of urine?  Patient denies   Any bowel dysfunction?   Patient denies     Constipation     diarrhea     HISTORY OF PRESENT ILLNESS 09/10/20: This is a pleasant 88 year old right-handed woman with a history of hypertension, hyperlipidemia, diabetes, presenting for evaluation of memory loss. She is accompanied by her daughter [name] who helps supplement the history today. She lives  with her husband who is also a patient in our clinic for memory loss. She feels her memory is pretty good, "I can remember pretty good." [name] reports that significant memory changes started quite quickly after a fall in the summer of 2021. Prior to this, she was having little memory changes, "normal aging," however after the fall where she hit her head, she was very out of it, unable to do things such as bathing or cooking. She would put things in different places, such as food under the cabinet where pots and pans go. Over the past year, she would have good and bad days, some days she is a little disoriented where [name]  would come and she would have no pants on, or 2 shirts on top of each other. There are things everywhere, she pulls everything out, taking everything out in her bedroom or kitchen. She says she likes to stay busy, [name] reports it is because she is always piddling around, even at night. Her family fixes her pillbox and her husband would give her medications. She says she is driving, her daughter states she stopped driving years ago. She has burned food on the stove. Family manages finances. They have an aide coming 2 hours 3 days a week, [name] checks on them regularly, along with their neighbor. There is no family history of dementia. She drinks a cocktail on the weekend occasionally with friends. She had a fall where she hit her head and "acted like having a seizure," she was brought to the ER in 12/2019, I personally reviewed head CT without contrast which did not show any acute changes, there was mild diffuse atrophy, chronic microvascular disease. [name] reports her eyes went to the back of her head and she was shaking, leading to a fall. She had a similar event in 03/2020 where she called out to her husband and he came to find her shaking, he tried to help and they both fell on the ground. Head CT again no changes. There is no report of seizures in both ER visits. [name] denies any staring/unresponsive episodes or myoclonic jerks.   She denies any headaches, dizziness, diplopia, dysarthria/dysphagia, neck pain, focal numbness/tingling/weakness, anosmia. She has low back pain. She wears Depends and has urinary incontinence. There were a few bouts of bowel incontinence where [name] thinks she took her Depends off before getting to the bathroom and soiled the floor, none in a while. She is on Donepezil 5mg  daily without side effects. She sleeps "day and night," and likes to watch soap operas. Mood is great, she likes to have fun. No paranoia or hallucinations.    PREVIOUS MEDICATIONS:   CURRENT  MEDICATIONS:  Outpatient Encounter Medications as of 10/01/2021  Medication Sig   acetaminophen (TYLENOL) 500 MG tablet Take 500 mg by mouth every 6 (six) hours as needed.    brimonidine-timolol (COMBIGAN) 0.2-0.5 % ophthalmic solution Place 1 drop into both eyes every 12 (twelve) hours. (Patient not taking: Reported on 09/24/2021)   donepezil (ARICEPT) 5 MG tablet TAKE 1 TABLET(5 MG) BY MOUTH AT BEDTIME   dorzolamide (TRUSOPT) 2 % ophthalmic solution 1 drop daily.   ELIQUIS 2.5 MG TABS tablet TAKE 1 TABLET BY MOUTH TWICE DAILY   furosemide (LASIX) 20 MG tablet TAKE 1/2 TABLET BY MOUTH EVERY MONDAY,WED AND FRIDAY   influenza vaccine adjuvanted (FLUAD QUADRIVALENT) 0.5 ML injection Fluad Quad 2020-2021(51yr up)(PF) 60 mcg (15 mcg x 4)/0.5mL IM syringe  ADM 0.5ML IM UTD   LUMIGAN 0.01 % SOLN  Place 1 drop into both eyes at bedtime.   memantine (NAMENDA) 10 MG tablet Take 1/2 tablet (5 mg at night) for 2 weeks, then increase to 1 tablet (10 mg) at night (Patient taking differently: Take 10 mg by mouth in the morning.)   nepafenac (ILEVRO) 0.3 % ophthalmic suspension Place 1 drop into both eyes 2 (two) times daily.   oxybutynin (DITROPAN-XL) 10 MG 24 hr tablet TAKE 1 TABLET(10 MG) BY MOUTH AT BEDTIME   No facility-administered encounter medications on file as of 10/01/2021.       03/14/2021    7:00 AM 09/10/2020   10:00 AM  MMSE - Mini Mental State Exam  Orientation to time 3 4  Orientation to Place 5 3  Registration 3 3  Attention/ Calculation 0 0  Recall 1 3  Language- name 2 objects 2 2  Language- repeat 1 1  Language- follow 3 step command 2 3  Language- read & follow direction 1 1  Write a sentence 0 0  Copy design 0 0  Total score 18 20       No data to display          Objective:     PHYSICAL EXAMINATION:    VITALS:  There were no vitals filed for this visit.  GEN:  The patient appears stated age and is in NAD. HEENT:  Normocephalic, atraumatic.   Neurological  examination:  General: NAD, well-groomed, appears stated age. Orientation: The patient is alert. Oriented to person, place and date Cranial nerves: There is good facial symmetry.The speech is fluent and clear. No aphasia or dysarthria. Fund of knowledge is appropriate. Recent and remote memory are impaired. Attention and concentration are reduced.  Able to name objects and repeat phrases.  Hearing is intact to conversational tone.    Sensation: Sensation is intact to light touch throughout Motor: Strength is at least antigravity x4. Tremors: none  DTR's 2/4 in UE/LE     Movement examination: Tone: There is normal tone in the UE/LE Abnormal movements:  no tremor.  No myoclonus.  No asterixis.   Coordination:  There is no decremation with RAM's. Normal finger to nose  Gait and Station: The patient has no difficulty arising out of a deep-seated chair without the use of the hands. The patient's stride length is good.  Gait is cautious and narrow.    Thank you for allowing [place] the opportunity to participate in the care of this nice patient. Please do not hesitate to contact us for any questions or concerns.   Total time spent on today's visit was *** minutes dedicated to this patient today, preparing to see patient, examining the patient, ordering tests and/or medications and counseling the patient, documenting clinical information in the EHR or other health record, independently interpreting results and communicating results to the patient/family, discussing treatment and goals, answering patient's questions and coordinating care.  Cc:  Nelwyn Salisbury, MD  Marlowe Kays 10/01/2021 2:54 PM

## 2021-10-14 ENCOUNTER — Other Ambulatory Visit (HOSPITAL_COMMUNITY): Payer: Self-pay | Admitting: Nurse Practitioner

## 2021-10-30 DIAGNOSIS — R55 Syncope and collapse: Secondary | ICD-10-CM | POA: Diagnosis not present

## 2021-10-30 DIAGNOSIS — R296 Repeated falls: Secondary | ICD-10-CM | POA: Diagnosis not present

## 2021-10-30 DIAGNOSIS — I700 Atherosclerosis of aorta: Secondary | ICD-10-CM | POA: Diagnosis not present

## 2021-10-30 DIAGNOSIS — J8410 Pulmonary fibrosis, unspecified: Secondary | ICD-10-CM | POA: Diagnosis not present

## 2021-10-30 DIAGNOSIS — I10 Essential (primary) hypertension: Secondary | ICD-10-CM | POA: Diagnosis not present

## 2021-10-30 DIAGNOSIS — W19XXXA Unspecified fall, initial encounter: Secondary | ICD-10-CM | POA: Diagnosis not present

## 2021-10-30 DIAGNOSIS — G301 Alzheimer's disease with late onset: Secondary | ICD-10-CM | POA: Diagnosis not present

## 2021-10-30 DIAGNOSIS — F02B0 Dementia in other diseases classified elsewhere, moderate, without behavioral disturbance, psychotic disturbance, mood disturbance, and anxiety: Secondary | ICD-10-CM | POA: Diagnosis not present

## 2021-10-30 DIAGNOSIS — N3001 Acute cystitis with hematuria: Secondary | ICD-10-CM | POA: Diagnosis not present

## 2021-10-30 DIAGNOSIS — N3281 Overactive bladder: Secondary | ICD-10-CM | POA: Diagnosis not present

## 2021-10-30 DIAGNOSIS — R000 Tachycardia, unspecified: Secondary | ICD-10-CM | POA: Diagnosis not present

## 2021-10-30 DIAGNOSIS — I480 Paroxysmal atrial fibrillation: Secondary | ICD-10-CM | POA: Diagnosis not present

## 2021-10-30 DIAGNOSIS — I517 Cardiomegaly: Secondary | ICD-10-CM | POA: Diagnosis not present

## 2021-10-30 DIAGNOSIS — I4891 Unspecified atrial fibrillation: Secondary | ICD-10-CM | POA: Diagnosis not present

## 2021-10-30 DIAGNOSIS — T1490XA Injury, unspecified, initial encounter: Secondary | ICD-10-CM | POA: Diagnosis not present

## 2021-10-30 DIAGNOSIS — N3000 Acute cystitis without hematuria: Secondary | ICD-10-CM | POA: Diagnosis not present

## 2021-10-30 DIAGNOSIS — E119 Type 2 diabetes mellitus without complications: Secondary | ICD-10-CM | POA: Diagnosis not present

## 2021-10-31 DIAGNOSIS — R296 Repeated falls: Secondary | ICD-10-CM | POA: Diagnosis not present

## 2021-10-31 DIAGNOSIS — I10 Essential (primary) hypertension: Secondary | ICD-10-CM | POA: Diagnosis not present

## 2021-10-31 DIAGNOSIS — I480 Paroxysmal atrial fibrillation: Secondary | ICD-10-CM | POA: Diagnosis not present

## 2021-10-31 DIAGNOSIS — F02B0 Dementia in other diseases classified elsewhere, moderate, without behavioral disturbance, psychotic disturbance, mood disturbance, and anxiety: Secondary | ICD-10-CM | POA: Diagnosis not present

## 2021-10-31 DIAGNOSIS — N3281 Overactive bladder: Secondary | ICD-10-CM | POA: Diagnosis not present

## 2021-10-31 DIAGNOSIS — G301 Alzheimer's disease with late onset: Secondary | ICD-10-CM | POA: Diagnosis not present

## 2021-10-31 DIAGNOSIS — N3000 Acute cystitis without hematuria: Secondary | ICD-10-CM | POA: Diagnosis not present

## 2021-11-01 DIAGNOSIS — N3281 Overactive bladder: Secondary | ICD-10-CM | POA: Diagnosis not present

## 2021-11-01 DIAGNOSIS — F02B0 Dementia in other diseases classified elsewhere, moderate, without behavioral disturbance, psychotic disturbance, mood disturbance, and anxiety: Secondary | ICD-10-CM | POA: Diagnosis not present

## 2021-11-01 DIAGNOSIS — G301 Alzheimer's disease with late onset: Secondary | ICD-10-CM | POA: Diagnosis not present

## 2021-11-01 DIAGNOSIS — I10 Essential (primary) hypertension: Secondary | ICD-10-CM | POA: Diagnosis not present

## 2021-11-01 DIAGNOSIS — N3000 Acute cystitis without hematuria: Secondary | ICD-10-CM | POA: Diagnosis not present

## 2021-11-01 DIAGNOSIS — I480 Paroxysmal atrial fibrillation: Secondary | ICD-10-CM | POA: Diagnosis not present

## 2021-11-01 DIAGNOSIS — R296 Repeated falls: Secondary | ICD-10-CM | POA: Diagnosis not present

## 2021-11-02 DIAGNOSIS — I10 Essential (primary) hypertension: Secondary | ICD-10-CM | POA: Diagnosis not present

## 2021-11-02 DIAGNOSIS — G301 Alzheimer's disease with late onset: Secondary | ICD-10-CM | POA: Diagnosis not present

## 2021-11-02 DIAGNOSIS — F02B0 Dementia in other diseases classified elsewhere, moderate, without behavioral disturbance, psychotic disturbance, mood disturbance, and anxiety: Secondary | ICD-10-CM | POA: Diagnosis not present

## 2021-11-02 DIAGNOSIS — R296 Repeated falls: Secondary | ICD-10-CM | POA: Diagnosis not present

## 2021-11-02 DIAGNOSIS — N3000 Acute cystitis without hematuria: Secondary | ICD-10-CM | POA: Diagnosis not present

## 2021-11-02 DIAGNOSIS — I480 Paroxysmal atrial fibrillation: Secondary | ICD-10-CM | POA: Diagnosis not present

## 2021-11-02 DIAGNOSIS — N3281 Overactive bladder: Secondary | ICD-10-CM | POA: Diagnosis not present

## 2021-11-03 DIAGNOSIS — I480 Paroxysmal atrial fibrillation: Secondary | ICD-10-CM | POA: Diagnosis not present

## 2021-11-03 DIAGNOSIS — I10 Essential (primary) hypertension: Secondary | ICD-10-CM | POA: Diagnosis not present

## 2021-11-03 DIAGNOSIS — G301 Alzheimer's disease with late onset: Secondary | ICD-10-CM | POA: Diagnosis not present

## 2021-11-03 DIAGNOSIS — F02B0 Dementia in other diseases classified elsewhere, moderate, without behavioral disturbance, psychotic disturbance, mood disturbance, and anxiety: Secondary | ICD-10-CM | POA: Diagnosis not present

## 2021-11-03 DIAGNOSIS — R296 Repeated falls: Secondary | ICD-10-CM | POA: Diagnosis not present

## 2021-11-03 DIAGNOSIS — N3000 Acute cystitis without hematuria: Secondary | ICD-10-CM | POA: Diagnosis not present

## 2021-11-03 DIAGNOSIS — N3281 Overactive bladder: Secondary | ICD-10-CM | POA: Diagnosis not present

## 2021-11-07 ENCOUNTER — Telehealth: Payer: Self-pay | Admitting: Family Medicine

## 2021-11-07 NOTE — Telephone Encounter (Signed)
As per Lele from [facility]:  Pt started home care today. Started medication education   1x a week for 6 wks.  [phone], if you have any questions

## 2021-11-21 ENCOUNTER — Other Ambulatory Visit: Payer: Self-pay | Admitting: *Deleted

## 2021-11-21 NOTE — Patient Outreach (Signed)
  Care Coordination   11/21/2021 Name: [name] MRN: [id number] DOB: 25-Nov-1928   Care Coordination Outreach Attempts:  An unsuccessful telephone outreach was attempted today to offer the patient information about available care coordination services as a benefit of their health plan.   Follow Up Plan:  Additional outreach attempts will be made to offer the patient care coordination information and services.   Encounter Outcome:  No Answer  Care Coordination Interventions Activated:  No   Care Coordination Interventions:  No, not indicated    Elliot Cousin, RN Care Management Coordinator [facility] Main Office [phone]

## 2021-11-26 ENCOUNTER — Other Ambulatory Visit: Payer: Self-pay | Admitting: Family Medicine

## 2021-11-29 DIAGNOSIS — I110 Hypertensive heart disease with heart failure: Secondary | ICD-10-CM | POA: Diagnosis not present

## 2021-11-29 DIAGNOSIS — I700 Atherosclerosis of aorta: Secondary | ICD-10-CM | POA: Diagnosis not present

## 2021-11-29 DIAGNOSIS — F0150 Vascular dementia without behavioral disturbance: Secondary | ICD-10-CM | POA: Diagnosis not present

## 2021-11-29 DIAGNOSIS — I509 Heart failure, unspecified: Secondary | ICD-10-CM | POA: Diagnosis not present

## 2021-11-29 DIAGNOSIS — G309 Alzheimer's disease, unspecified: Secondary | ICD-10-CM | POA: Diagnosis not present

## 2021-11-29 DIAGNOSIS — D6869 Other thrombophilia: Secondary | ICD-10-CM | POA: Diagnosis not present

## 2021-11-29 DIAGNOSIS — E119 Type 2 diabetes mellitus without complications: Secondary | ICD-10-CM | POA: Diagnosis not present

## 2021-11-29 DIAGNOSIS — H409 Unspecified glaucoma: Secondary | ICD-10-CM | POA: Diagnosis not present

## 2021-11-29 DIAGNOSIS — I4891 Unspecified atrial fibrillation: Secondary | ICD-10-CM | POA: Diagnosis not present

## 2021-12-02 ENCOUNTER — Other Ambulatory Visit: Payer: Self-pay | Admitting: *Deleted

## 2021-12-02 ENCOUNTER — Encounter: Payer: Self-pay | Admitting: *Deleted

## 2021-12-02 NOTE — Patient Instructions (Signed)
Visit Information  Thank you for taking time to visit with me today. Please don't hesitate to contact me if I can be of assistance to you.   Following are the goals we discussed today:   Goals Addressed               This Visit's Progress     No further follow up calls needed (pt-stated)        Care Coordination Interventions: Provided education to patient and/or caregiver about advanced directives Reviewed medications with patient and discussed purpose of all medications Reviewed scheduled/upcoming provider appointments including pending appointments and verified recent AWV completed on 07/15/2021 Screening for signs and symptoms of depression related to chronic disease state  Assessed social determinant of health barriers          Please call the care guide team at [phone] if you need to cancel or reschedule your appointment.   If you are experiencing a Mental Health or Behavioral Health Crisis or need someone to talk to, please call the Suicide and Crisis Lifeline: 988  The patient verbalized understanding of instructions, educational materials, and care plan provided today and DECLINED offer to receive copy of patient instructions, educational materials, and care plan.   No further follow up required: No needs  Elliot Cousin, RN Care Management Coordinator [facility] Main Office [phone]

## 2021-12-02 NOTE — Patient Outreach (Signed)
  Care Coordination   Initial Visit Note   12/02/2021 Name: [name] MRN: [id number] DOB: [birth year]  [name] is a 88 y.o. year old female who sees Nelwyn Salisbury, MD for primary care. I spoke with  [name] by phone today  What matters to the patients health and wellness today?  No needs    Goals Addressed               This Visit's Progress     No further follow up calls needed (pt-stated)        Care Coordination Interventions: Provided education to patient and/or caregiver about advanced directives Reviewed medications with patient and discussed purpose of all medications Reviewed scheduled/upcoming provider appointments including pending appointments and verified recent AWV completed on 07/15/2021 Screening for signs and symptoms of depression related to chronic disease state  Assessed social determinant of health barriers          SDOH assessments and interventions completed:  Yes  SDOH Interventions Today    Flowsheet Row Most Recent Value  SDOH Interventions   Food Insecurity Interventions Intervention Not Indicated  Housing Interventions Intervention Not Indicated  Transportation Interventions Intervention Not Indicated        Care Coordination Interventions Activated:  Yes  Care Coordination Interventions:  Yes, provided   Follow up plan: No further intervention required.   Encounter Outcome:  Pt. Visit Completed   Elliot Cousin, RN Care Management Coordinator [facility] Main Office [phone]

## 2022-01-01 DIAGNOSIS — H43813 Vitreous degeneration, bilateral: Secondary | ICD-10-CM | POA: Diagnosis not present

## 2022-01-01 DIAGNOSIS — H35033 Hypertensive retinopathy, bilateral: Secondary | ICD-10-CM | POA: Diagnosis not present

## 2022-01-01 DIAGNOSIS — E119 Type 2 diabetes mellitus without complications: Secondary | ICD-10-CM | POA: Diagnosis not present

## 2022-01-01 DIAGNOSIS — H401133 Primary open-angle glaucoma, bilateral, severe stage: Secondary | ICD-10-CM | POA: Diagnosis not present

## 2022-01-28 ENCOUNTER — Ambulatory Visit: Payer: Medicare PPO | Admitting: Family Medicine

## 2022-01-28 ENCOUNTER — Encounter: Payer: Self-pay | Admitting: Family Medicine

## 2022-01-28 VITALS — BP 124/78 | Temp 99.0°F | Wt 117.5 lb

## 2022-01-28 DIAGNOSIS — I480 Paroxysmal atrial fibrillation: Secondary | ICD-10-CM

## 2022-01-28 DIAGNOSIS — N390 Urinary tract infection, site not specified: Secondary | ICD-10-CM

## 2022-01-28 DIAGNOSIS — R7989 Other specified abnormal findings of blood chemistry: Secondary | ICD-10-CM | POA: Diagnosis not present

## 2022-01-28 DIAGNOSIS — E538 Deficiency of other specified B group vitamins: Secondary | ICD-10-CM

## 2022-01-28 DIAGNOSIS — Z23 Encounter for immunization: Secondary | ICD-10-CM

## 2022-01-28 DIAGNOSIS — E119 Type 2 diabetes mellitus without complications: Secondary | ICD-10-CM

## 2022-01-28 DIAGNOSIS — I10 Essential (primary) hypertension: Secondary | ICD-10-CM

## 2022-01-28 DIAGNOSIS — R413 Other amnesia: Secondary | ICD-10-CM | POA: Diagnosis not present

## 2022-01-28 LAB — CBC WITH DIFFERENTIAL/PLATELET
Basophils Absolute: 0 10*3/uL (ref 0.0–0.1)
Basophils Relative: 0.5 % (ref 0.0–3.0)
Eosinophils Absolute: 0.1 10*3/uL (ref 0.0–0.7)
Eosinophils Relative: 2 % (ref 0.0–5.0)
HCT: 35.6 % — ABNORMAL LOW (ref 36.0–46.0)
Hemoglobin: 12.1 g/dL (ref 12.0–15.0)
Lymphocytes Relative: 35.2 % (ref 12.0–46.0)
Lymphs Abs: 2 10*3/uL (ref 0.7–4.0)
MCHC: 34.1 g/dL (ref 30.0–36.0)
MCV: 93.7 fl (ref 78.0–100.0)
Monocytes Absolute: 0.4 10*3/uL (ref 0.1–1.0)
Monocytes Relative: 6.6 % (ref 3.0–12.0)
Neutro Abs: 3.1 10*3/uL (ref 1.4–7.7)
Neutrophils Relative %: 55.7 % (ref 43.0–77.0)
Platelets: 135 10*3/uL — ABNORMAL LOW (ref 150.0–400.0)
RBC: 3.8 Mil/uL — ABNORMAL LOW (ref 3.87–5.11)
RDW: 13.9 % (ref 11.5–15.5)
WBC: 5.6 10*3/uL (ref 4.0–10.5)

## 2022-01-28 LAB — BASIC METABOLIC PANEL
BUN: 16 mg/dL (ref 6–23)
CO2: 25 mEq/L (ref 19–32)
Calcium: 9.2 mg/dL (ref 8.4–10.5)
Chloride: 106 mEq/L (ref 96–112)
Creatinine, Ser: 0.98 mg/dL (ref 0.40–1.20)
GFR: 50.67 mL/min — ABNORMAL LOW (ref >60.00)
Glucose, Bld: 155 mg/dL — ABNORMAL HIGH (ref 70–99)
Potassium: 4 mEq/L (ref 3.5–5.1)
Sodium: 140 mEq/L (ref 135–145)

## 2022-01-28 LAB — HEPATIC FUNCTION PANEL
ALT: 8 U/L (ref 0–35)
AST: 11 U/L (ref 0–37)
Albumin: 3.8 g/dL (ref 3.5–5.2)
Alkaline Phosphatase: 95 U/L (ref 39–117)
Bilirubin, Direct: 0.2 mg/dL (ref 0.0–0.3)
Total Bilirubin: 1.2 mg/dL (ref 0.2–1.2)
Total Protein: 6.5 g/dL (ref 6.0–8.3)

## 2022-01-28 LAB — TSH: TSH: 0.02 u[IU]/mL — ABNORMAL LOW (ref 0.35–5.50)

## 2022-01-28 LAB — VITAMIN B12: Vitamin B-12: 216 pg/mL (ref 211–911)

## 2022-01-28 LAB — HEMOGLOBIN A1C: Hgb A1c MFr Bld: 6.2 % (ref 4.6–6.5)

## 2022-01-28 MED ORDER — RIVASTIGMINE 4.6 MG/24HR TD PT24: 4.6000 mg | MEDICATED_PATCH | Freq: Every day | TRANSDERMAL | 5 refills | Status: AC

## 2022-01-28 MED ORDER — NITROFURANTOIN MONOHYD MACRO 100 MG PO CAPS: 100.0000 mg | ORAL_CAPSULE | Freq: Two times a day (BID) | ORAL | 0 refills | Status: DC

## 2022-01-28 NOTE — Progress Notes (Signed)
   Subjective:    Patient ID: [name], female    DOB: February 11, 1929, 88 y.o.   MRN: [id number]  HPI Here with her family for several weeks of weakness, intermittent diarrhea, and increased urinations. She is now incontinent of urine and she wears protective underpants. The family also says her dementia is progressing more rapidly that before. No fever or cough. Her appetite is good.    Review of Systems  Constitutional:  Positive for fatigue. Negative for fever.  HENT: Negative.    Respiratory: Negative.    Cardiovascular: Negative.   Gastrointestinal:  Positive for diarrhea. Negative for abdominal distention, abdominal pain, blood in stool, constipation, nausea and vomiting.  Genitourinary:  Negative for dysuria, flank pain, hematuria and urgency.  Psychiatric/Behavioral:  Positive for confusion. Negative for agitation and hallucinations. The patient is not nervous/anxious.        Objective:   Physical Exam Constitutional:      Comments: Frail, able to get on the exam table with assistance. Uses a walker   Cardiovascular:     Rate and Rhythm: Normal rate. Rhythm irregular.     Pulses: Normal pulses.     Heart sounds: Normal heart sounds.  Pulmonary:     Effort: Pulmonary effort is normal.     Breath sounds: Normal breath sounds.  Musculoskeletal:     Comments: Trace edema in both ankles   Neurological:     Mental Status: She is alert.           Assessment & Plan:  Overall she is declining in health. The dementia has worsened, and think hte diarrhea may be a side effect of the Aricept. We will stay on Nemenda, but we will stop the Aricept. In its place she will try Exelon patches daily. She may or may not have a UTI, but due to the difficulty of getting a sample, we decided to go ahead and treat her with 7 days of Macrobid. We will stop the Furosemide. Get labs today. We spent a total of ( 35  ) minutes reviewing records and discussing these issues.  Alysia Penna,  MD

## 2022-01-28 NOTE — Addendum Note (Signed)
Addended by: Wyvonne Lenz on: 01/28/2022 04:35 PM   Modules accepted: Orders

## 2022-01-28 NOTE — Progress Notes (Signed)
   Subjective:    Patient ID: [name], female    DOB: Sep 11, 1928, 88 y.o.   MRN: [id number]  HPI    Review of Systems     Objective:   Physical Exam        Assessment & Plan:

## 2022-01-29 NOTE — Addendum Note (Signed)
Addended by: Alysia Penna A on: 01/29/2022 07:56 AM   Modules accepted: Orders

## 2022-02-04 ENCOUNTER — Other Ambulatory Visit: Payer: Self-pay

## 2022-02-11 DIAGNOSIS — R531 Weakness: Secondary | ICD-10-CM | POA: Diagnosis not present

## 2022-02-11 DIAGNOSIS — I480 Paroxysmal atrial fibrillation: Secondary | ICD-10-CM | POA: Diagnosis not present

## 2022-02-11 DIAGNOSIS — E785 Hyperlipidemia, unspecified: Secondary | ICD-10-CM | POA: Diagnosis not present

## 2022-02-11 DIAGNOSIS — F0390 Unspecified dementia without behavioral disturbance: Secondary | ICD-10-CM | POA: Diagnosis not present

## 2022-02-11 DIAGNOSIS — M1712 Unilateral primary osteoarthritis, left knee: Secondary | ICD-10-CM | POA: Diagnosis not present

## 2022-02-11 DIAGNOSIS — Z7901 Long term (current) use of anticoagulants: Secondary | ICD-10-CM | POA: Diagnosis not present

## 2022-02-11 DIAGNOSIS — Z880 Allergy status to penicillin: Secondary | ICD-10-CM | POA: Diagnosis not present

## 2022-02-11 DIAGNOSIS — I491 Atrial premature depolarization: Secondary | ICD-10-CM | POA: Diagnosis not present

## 2022-02-11 DIAGNOSIS — R9431 Abnormal electrocardiogram [ECG] [EKG]: Secondary | ICD-10-CM | POA: Diagnosis not present

## 2022-02-11 DIAGNOSIS — E119 Type 2 diabetes mellitus without complications: Secondary | ICD-10-CM | POA: Diagnosis not present

## 2022-02-11 DIAGNOSIS — I10 Essential (primary) hypertension: Secondary | ICD-10-CM | POA: Diagnosis not present

## 2022-02-11 DIAGNOSIS — R5383 Other fatigue: Secondary | ICD-10-CM | POA: Diagnosis not present

## 2022-02-16 DIAGNOSIS — R42 Dizziness and giddiness: Secondary | ICD-10-CM | POA: Diagnosis not present

## 2022-02-16 DIAGNOSIS — M25562 Pain in left knee: Secondary | ICD-10-CM | POA: Diagnosis not present

## 2022-02-16 DIAGNOSIS — N1831 Chronic kidney disease, stage 3a: Secondary | ICD-10-CM | POA: Diagnosis not present

## 2022-02-16 DIAGNOSIS — S0990XA Unspecified injury of head, initial encounter: Secondary | ICD-10-CM | POA: Diagnosis not present

## 2022-02-16 DIAGNOSIS — I499 Cardiac arrhythmia, unspecified: Secondary | ICD-10-CM | POA: Diagnosis not present

## 2022-02-16 DIAGNOSIS — I480 Paroxysmal atrial fibrillation: Secondary | ICD-10-CM | POA: Diagnosis not present

## 2022-02-16 DIAGNOSIS — R0602 Shortness of breath: Secondary | ICD-10-CM | POA: Diagnosis not present

## 2022-02-16 DIAGNOSIS — R9082 White matter disease, unspecified: Secondary | ICD-10-CM | POA: Diagnosis not present

## 2022-02-16 DIAGNOSIS — I700 Atherosclerosis of aorta: Secondary | ICD-10-CM | POA: Diagnosis not present

## 2022-02-16 DIAGNOSIS — M778 Other enthesopathies, not elsewhere classified: Secondary | ICD-10-CM | POA: Diagnosis not present

## 2022-02-16 DIAGNOSIS — R7989 Other specified abnormal findings of blood chemistry: Secondary | ICD-10-CM | POA: Diagnosis not present

## 2022-02-16 DIAGNOSIS — R5383 Other fatigue: Secondary | ICD-10-CM | POA: Diagnosis not present

## 2022-02-16 DIAGNOSIS — R079 Chest pain, unspecified: Secondary | ICD-10-CM | POA: Diagnosis not present

## 2022-02-16 DIAGNOSIS — M25511 Pain in right shoulder: Secondary | ICD-10-CM | POA: Diagnosis not present

## 2022-02-16 DIAGNOSIS — I6501 Occlusion and stenosis of right vertebral artery: Secondary | ICD-10-CM | POA: Diagnosis not present

## 2022-02-16 DIAGNOSIS — M19011 Primary osteoarthritis, right shoulder: Secondary | ICD-10-CM | POA: Diagnosis not present

## 2022-02-16 DIAGNOSIS — I4891 Unspecified atrial fibrillation: Secondary | ICD-10-CM | POA: Diagnosis not present

## 2022-02-16 DIAGNOSIS — I6523 Occlusion and stenosis of bilateral carotid arteries: Secondary | ICD-10-CM | POA: Diagnosis not present

## 2022-02-16 DIAGNOSIS — R55 Syncope and collapse: Secondary | ICD-10-CM | POA: Diagnosis not present

## 2022-02-16 DIAGNOSIS — E1122 Type 2 diabetes mellitus with diabetic chronic kidney disease: Secondary | ICD-10-CM | POA: Diagnosis not present

## 2022-02-16 DIAGNOSIS — I129 Hypertensive chronic kidney disease with stage 1 through stage 4 chronic kidney disease, or unspecified chronic kidney disease: Secondary | ICD-10-CM | POA: Diagnosis not present

## 2022-02-16 DIAGNOSIS — N179 Acute kidney failure, unspecified: Secondary | ICD-10-CM | POA: Diagnosis not present

## 2022-02-16 DIAGNOSIS — R000 Tachycardia, unspecified: Secondary | ICD-10-CM | POA: Diagnosis not present

## 2022-02-16 DIAGNOSIS — I959 Hypotension, unspecified: Secondary | ICD-10-CM | POA: Diagnosis not present

## 2022-02-16 DIAGNOSIS — I651 Occlusion and stenosis of basilar artery: Secondary | ICD-10-CM | POA: Diagnosis not present

## 2022-02-16 DIAGNOSIS — I517 Cardiomegaly: Secondary | ICD-10-CM | POA: Diagnosis not present

## 2022-02-16 DIAGNOSIS — I7774 Dissection of vertebral artery: Secondary | ICD-10-CM | POA: Diagnosis not present

## 2022-02-17 DIAGNOSIS — I480 Paroxysmal atrial fibrillation: Secondary | ICD-10-CM | POA: Diagnosis not present

## 2022-02-17 DIAGNOSIS — N179 Acute kidney failure, unspecified: Secondary | ICD-10-CM | POA: Diagnosis not present

## 2022-02-17 DIAGNOSIS — N1831 Chronic kidney disease, stage 3a: Secondary | ICD-10-CM | POA: Diagnosis not present

## 2022-02-17 DIAGNOSIS — R9082 White matter disease, unspecified: Secondary | ICD-10-CM | POA: Diagnosis not present

## 2022-02-17 DIAGNOSIS — I083 Combined rheumatic disorders of mitral, aortic and tricuspid valves: Secondary | ICD-10-CM | POA: Diagnosis not present

## 2022-02-17 DIAGNOSIS — R55 Syncope and collapse: Secondary | ICD-10-CM | POA: Diagnosis not present

## 2022-02-18 ENCOUNTER — Telehealth: Payer: Self-pay | Admitting: Licensed Clinical Social Worker

## 2022-02-18 DIAGNOSIS — I480 Paroxysmal atrial fibrillation: Secondary | ICD-10-CM | POA: Diagnosis not present

## 2022-02-18 DIAGNOSIS — N1831 Chronic kidney disease, stage 3a: Secondary | ICD-10-CM | POA: Diagnosis not present

## 2022-02-18 DIAGNOSIS — N179 Acute kidney failure, unspecified: Secondary | ICD-10-CM | POA: Diagnosis not present

## 2022-02-18 DIAGNOSIS — R55 Syncope and collapse: Secondary | ICD-10-CM | POA: Diagnosis not present

## 2022-02-18 NOTE — Patient Outreach (Signed)
  Care Coordination   02/18/2022 Name: [name] MRN: [id number] DOB: 1928/07/07   Care Coordination Outreach Attempts:  An unsuccessful telephone outreach was attempted today to offer the patient information about available care coordination services as a benefit of their health plan.   Follow Up Plan:  Additional outreach attempts will be made to offer the patient care coordination information and services.   Encounter Outcome:  No Answer  Care Coordination Interventions Activated:  No   Care Coordination Interventions:  No, not indicated    Jenel Lucks, MSW, LCSW [facility] Care Management [facility]  [facility] [email or web address] Phone [phone] 11:12 AM

## 2022-02-19 DIAGNOSIS — I480 Paroxysmal atrial fibrillation: Secondary | ICD-10-CM | POA: Diagnosis not present

## 2022-02-19 DIAGNOSIS — N179 Acute kidney failure, unspecified: Secondary | ICD-10-CM | POA: Diagnosis not present

## 2022-02-19 DIAGNOSIS — R55 Syncope and collapse: Secondary | ICD-10-CM | POA: Diagnosis not present

## 2022-02-19 DIAGNOSIS — N1831 Chronic kidney disease, stage 3a: Secondary | ICD-10-CM | POA: Diagnosis not present

## 2022-02-20 DIAGNOSIS — N1831 Chronic kidney disease, stage 3a: Secondary | ICD-10-CM | POA: Diagnosis not present

## 2022-02-20 DIAGNOSIS — R55 Syncope and collapse: Secondary | ICD-10-CM | POA: Diagnosis not present

## 2022-02-20 DIAGNOSIS — I480 Paroxysmal atrial fibrillation: Secondary | ICD-10-CM | POA: Diagnosis not present

## 2022-02-20 DIAGNOSIS — N179 Acute kidney failure, unspecified: Secondary | ICD-10-CM | POA: Diagnosis not present

## 2022-02-21 ENCOUNTER — Other Ambulatory Visit: Payer: Self-pay | Admitting: Family Medicine

## 2022-02-22 DIAGNOSIS — N179 Acute kidney failure, unspecified: Secondary | ICD-10-CM | POA: Diagnosis not present

## 2022-02-22 DIAGNOSIS — N1831 Chronic kidney disease, stage 3a: Secondary | ICD-10-CM | POA: Diagnosis not present

## 2022-02-22 DIAGNOSIS — R55 Syncope and collapse: Secondary | ICD-10-CM | POA: Diagnosis not present

## 2022-02-22 DIAGNOSIS — I480 Paroxysmal atrial fibrillation: Secondary | ICD-10-CM | POA: Diagnosis not present

## 2022-02-23 DIAGNOSIS — I10 Essential (primary) hypertension: Secondary | ICD-10-CM | POA: Diagnosis not present

## 2022-02-23 DIAGNOSIS — I480 Paroxysmal atrial fibrillation: Secondary | ICD-10-CM | POA: Diagnosis not present

## 2022-02-23 DIAGNOSIS — I7771 Dissection of carotid artery: Secondary | ICD-10-CM | POA: Diagnosis not present

## 2022-02-23 DIAGNOSIS — N184 Chronic kidney disease, stage 4 (severe): Secondary | ICD-10-CM | POA: Diagnosis not present

## 2022-02-23 DIAGNOSIS — E785 Hyperlipidemia, unspecified: Secondary | ICD-10-CM | POA: Diagnosis not present

## 2022-02-24 DIAGNOSIS — Z7901 Long term (current) use of anticoagulants: Secondary | ICD-10-CM | POA: Diagnosis not present

## 2022-02-24 DIAGNOSIS — Z79899 Other long term (current) drug therapy: Secondary | ICD-10-CM | POA: Diagnosis not present

## 2022-02-24 DIAGNOSIS — I480 Paroxysmal atrial fibrillation: Secondary | ICD-10-CM | POA: Diagnosis not present

## 2022-02-24 DIAGNOSIS — N179 Acute kidney failure, unspecified: Secondary | ICD-10-CM | POA: Diagnosis not present

## 2022-02-24 DIAGNOSIS — N3281 Overactive bladder: Secondary | ICD-10-CM | POA: Diagnosis not present

## 2022-02-24 DIAGNOSIS — N1831 Chronic kidney disease, stage 3a: Secondary | ICD-10-CM | POA: Diagnosis not present

## 2022-02-24 DIAGNOSIS — R55 Syncope and collapse: Secondary | ICD-10-CM | POA: Diagnosis not present

## 2022-02-25 DIAGNOSIS — N3281 Overactive bladder: Secondary | ICD-10-CM | POA: Diagnosis not present

## 2022-02-25 DIAGNOSIS — Z7901 Long term (current) use of anticoagulants: Secondary | ICD-10-CM | POA: Diagnosis not present

## 2022-02-25 DIAGNOSIS — I480 Paroxysmal atrial fibrillation: Secondary | ICD-10-CM | POA: Diagnosis not present

## 2022-02-25 DIAGNOSIS — R55 Syncope and collapse: Secondary | ICD-10-CM | POA: Diagnosis not present

## 2022-03-16 ENCOUNTER — Other Ambulatory Visit (HOSPITAL_COMMUNITY): Payer: Self-pay | Admitting: *Deleted

## 2022-03-16 MED ORDER — METOPROLOL TARTRATE 25 MG PO TABS: 12.5000 mg | ORAL_TABLET | Freq: Two times a day (BID) | ORAL | 2 refills | Status: DC

## 2022-03-24 ENCOUNTER — Other Ambulatory Visit: Payer: Self-pay | Admitting: Family Medicine

## 2022-03-26 ENCOUNTER — Telehealth: Payer: Self-pay | Admitting: Family Medicine

## 2022-03-26 DIAGNOSIS — I480 Paroxysmal atrial fibrillation: Secondary | ICD-10-CM | POA: Diagnosis not present

## 2022-03-26 DIAGNOSIS — Z96642 Presence of left artificial hip joint: Secondary | ICD-10-CM

## 2022-03-26 DIAGNOSIS — Z9181 History of falling: Secondary | ICD-10-CM

## 2022-03-26 DIAGNOSIS — K5790 Diverticulosis of intestine, part unspecified, without perforation or abscess without bleeding: Secondary | ICD-10-CM

## 2022-03-26 DIAGNOSIS — E785 Hyperlipidemia, unspecified: Secondary | ICD-10-CM | POA: Diagnosis not present

## 2022-03-26 DIAGNOSIS — Z7901 Long term (current) use of anticoagulants: Secondary | ICD-10-CM

## 2022-03-26 DIAGNOSIS — N184 Chronic kidney disease, stage 4 (severe): Secondary | ICD-10-CM | POA: Diagnosis not present

## 2022-03-26 DIAGNOSIS — N179 Acute kidney failure, unspecified: Secondary | ICD-10-CM | POA: Diagnosis not present

## 2022-03-26 DIAGNOSIS — I1310 Hypertensive heart and chronic kidney disease without heart failure, with stage 1 through stage 4 chronic kidney disease, or unspecified chronic kidney disease: Secondary | ICD-10-CM | POA: Diagnosis not present

## 2022-03-26 DIAGNOSIS — I680 Cerebral amyloid angiopathy: Secondary | ICD-10-CM | POA: Diagnosis not present

## 2022-03-26 DIAGNOSIS — I051 Rheumatic mitral insufficiency: Secondary | ICD-10-CM | POA: Diagnosis not present

## 2022-03-26 DIAGNOSIS — I130 Hypertensive heart and chronic kidney disease with heart failure and stage 1 through stage 4 chronic kidney disease, or unspecified chronic kidney disease: Secondary | ICD-10-CM

## 2022-03-26 DIAGNOSIS — E1122 Type 2 diabetes mellitus with diabetic chronic kidney disease: Secondary | ICD-10-CM | POA: Diagnosis not present

## 2022-03-26 DIAGNOSIS — Z7982 Long term (current) use of aspirin: Secondary | ICD-10-CM

## 2022-03-26 DIAGNOSIS — N3281 Overactive bladder: Secondary | ICD-10-CM

## 2022-03-26 DIAGNOSIS — E854 Organ-limited amyloidosis: Secondary | ICD-10-CM | POA: Diagnosis not present

## 2022-03-26 NOTE — Telephone Encounter (Signed)
Kelly call with a FYI and stated pt had a fall last night no injury EMS was call and they helped her back to bed.

## 2022-03-27 NOTE — Telephone Encounter (Signed)
Noted  

## 2022-03-31 ENCOUNTER — Encounter: Payer: Self-pay | Admitting: Family Medicine

## 2022-03-31 ENCOUNTER — Ambulatory Visit: Payer: Medicare PPO | Admitting: Family Medicine

## 2022-03-31 VITALS — BP 124/76 | Temp 98.4°F | Wt 117.0 lb

## 2022-03-31 DIAGNOSIS — I10 Essential (primary) hypertension: Secondary | ICD-10-CM | POA: Diagnosis not present

## 2022-03-31 DIAGNOSIS — I7774 Dissection of vertebral artery: Secondary | ICD-10-CM | POA: Diagnosis not present

## 2022-03-31 DIAGNOSIS — R55 Syncope and collapse: Secondary | ICD-10-CM

## 2022-03-31 DIAGNOSIS — I480 Paroxysmal atrial fibrillation: Secondary | ICD-10-CM | POA: Diagnosis not present

## 2022-03-31 NOTE — Progress Notes (Signed)
   Subjective:    Patient ID: [name], female    DOB: February 11, 1929, 88 y.o.   MRN: [id number]  HPI Here with her daughter Kyra Leyland and her husband to follow up a hospital stay from 02-16-22 to 02-24-22, and then a subsequent stay at [facility] facility until she went home on 03-07-22. The day of her admission she had several short episodes of syncope on her soda, and she was unresponsive to the family at times. EMS was called and she she was taken to the ED. That day an ECHO showed an EF of 48% with moderate diastolic dysfunction. No thrombi were seen. An MRI of the brain showed old areas of small vessel ischemia and a few old lacunar infarcts, but no acute strokes. A 4 mm lesion along the right faux cerebrum was felt to represent a meningioma. A CT angiogram of the head and neck demonstrated diffuse atherosclerosis but no stenoses of more than 50%. 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 She has chronic atrial fibrillation and on arrival to the ED her BP was 162/88 and the HR was 110. Labs were remarkable only for a creatinine of 1.03. Apparently the syncopal episodes were caused by the rapid ventricular response from the AF. After her medications were adjusted she was sent to the rehab facility for PT and OT. Since going home, she has continued to get PT and OT. She usually uses a walker at home, but she is in a wheelchair today. She has no complaints, and the family says there have been no more syncopal spells.    Review of Systems  Constitutional:  Positive for fatigue.  Respiratory: Negative.    Cardiovascular: Negative.   Gastrointestinal: Negative.   Genitourinary: Negative.   Neurological:  Negative for dizziness, tremors, seizures, facial asymmetry, speech difficulty, weakness, numbness and headaches.       Objective:   Physical Exam Constitutional:      Appearance: Normal appearance.     Comments: In a wheelchair   Cardiovascular:      Rate and Rhythm: Normal rate. Rhythm irregular.     Pulses: Normal pulses.     Heart sounds: Normal heart sounds.  Pulmonary:     Effort: Pulmonary effort is normal.     Breath sounds: Normal breath sounds.  Musculoskeletal:     Right lower leg: No edema.     Left lower leg: No edema.  Neurological:     Mental Status: She is alert.     Comments: She is pleasant but not oriented            Assessment & Plan:  She is recovering from some syncopal episodes caused by RVR from her atrial fibrillation. Her BP and pulse rates appear to be well controlled now. The family will schedule follow up visits with Rudi Coco NP in Cardiology as well as with Marlowe Kays PA in Neurology. We will refer her to see Vascular Surgery to evaluate the vertebral dissection. We spent a total of (35   ) minutes reviewing records and discussing these issues.  Gershon Crane, MD

## 2022-04-01 ENCOUNTER — Encounter: Payer: Self-pay | Admitting: Vascular Surgery

## 2022-04-01 ENCOUNTER — Ambulatory Visit: Payer: Medicare PPO | Admitting: Vascular Surgery

## 2022-04-01 VITALS — BP 144/87 | Temp 98.5°F | Resp 18 | Ht 61.5 in | Wt 115.0 lb

## 2022-04-01 DIAGNOSIS — I7774 Dissection of vertebral artery: Secondary | ICD-10-CM

## 2022-04-01 NOTE — Progress Notes (Signed)
Patient ID: [name], female   DOB: [birth year], 88 y.o.   MRN: [id number]  Reason for Consult: New Patient (Initial Visit)   Referred by Nelwyn Salisbury, MD  Subjective:     HPI:  [name] is a 88 y.o. female without significant vascular history although she does have risk factors of diabetes, hypercholesterolemia and hypertension.  She is anticoagulated for paroxysmal atrial fibrillation.  Recently was admitted to [facility] metabolic with syncopal episode and underwent MRI which was negative as well as CTA of the head and neck which demonstrated vertebral artery dissection.  States that she cannot take aspirin.  She is chronically anticoagulated.  Has not had any further symptoms since most recent hospitalization.  Past Medical History:  Diagnosis Date   Diabetes mellitus without complication (HCC)    Diverticulitis    Glaucoma    Hypercholesterolemia    Hypertension    PAF (paroxysmal atrial fibrillation) (HCC)    sees Rudi Coco NP    Shingles    Family History  Problem Relation Age of Onset   CAD Father    Past Surgical History:  Procedure Laterality Date   ABDOMINAL HYSTERECTOMY     COLONOSCOPY     per Dr. Elnoria Howard    TOTAL HIP ARTHROPLASTY  2005   left sided    Short Social History:  Social History   Tobacco Use   Smoking status: Never   Smokeless tobacco: Never  Substance Use Topics   Alcohol use: Never    Comment: occassionally    Allergies  Allergen Reactions   Penicillins Anaphylaxis and Other (See Comments)    Did it involve swelling of the face/tongue/throat, SOB, or low BP? Yes Did it involve sudden or severe rash/hives, skin peeling, or any reaction on the inside of your mouth or nose? Yes Did you need to seek medical attention at a hospital or doctor's office? Yes When did it last happen? Unknown If all above answers are "NO", may proceed with cephalosporin use.  Did it involve swelling of the face/tongue/throat, SOB, or low BP?  Yes Did it involve sudden or severe rash/hives, skin peeling, or any reaction on the inside of your mouth or nose? Yes Did you need to seek medical attention at a hospital or doctor's office? Yes When did it last happen? Unknown If all above answers are "NO", may proceed with cephalosporin use.    Aricept [Donepezil] Diarrhea   Benadryl [Diphenhydramine] Rash    Current Outpatient Medications  Medication Sig Dispense Refill   acetaminophen (TYLENOL) 500 MG tablet Take 500 mg by mouth every 6 (six) hours as needed.      brimonidine-timolol (COMBIGAN) 0.2-0.5 % ophthalmic solution Place 1 drop into both eyes every 12 (twelve) hours.     dorzolamide (TRUSOPT) 2 % ophthalmic solution 1 drop daily.     ELIQUIS 2.5 MG TABS tablet TAKE 1 TABLET BY MOUTH TWICE DAILY 60 tablet 11   influenza vaccine adjuvanted (FLUAD QUADRIVALENT) 0.5 ML injection Fluad Quad 2020-2021(6yr up)(PF) 60 mcg (15 mcg x 4)/0.82mL IM syringe  ADM 0.5ML IM UTD     LUMIGAN 0.01 % SOLN Place 1 drop into both eyes at bedtime.  5   memantine (NAMENDA) 10 MG tablet Take 1/2 tablet (5 mg at night) for 2 weeks, then increase to 1 tablet (10 mg) at night (Patient taking differently: Take 10 mg by mouth in the morning.) 30 tablet 11   metoprolol tartrate (LOPRESSOR) 25 MG tablet Take 0.5  tablets (12.5 mg total) by mouth 2 (two) times daily. 30 tablet 2   nepafenac (ILEVRO) 0.3 % ophthalmic suspension Place 1 drop into both eyes 2 (two) times daily.     oxybutynin (DITROPAN-XL) 10 MG 24 hr tablet TAKE 1 TABLET(10 MG) BY MOUTH AT BEDTIME 30 tablet 3   rivastigmine (EXELON) 4.6 mg/24hr Place 1 patch (4.6 mg total) onto the skin daily. 30 patch 5   No current facility-administered medications for this visit.    Review of Systems  Constitutional:  Constitutional negative. HENT: HENT negative.  Eyes: Eyes negative.  Respiratory: Respiratory negative.  Cardiovascular: Cardiovascular negative.  GI: Gastrointestinal negative.   Musculoskeletal: Musculoskeletal negative.        Objective:  Objective   Vitals:   04/01/22 1512  BP: (!) 144/87  Resp: 18  Temp: 98.5 F (36.9 C)     Physical Exam  Data: CTA head and neck:  Technique: Postcontrast imaging was obtained through the head and neck after intravenous contrast is administered utilizing a CTA protocol. MIP reconstructed sagittal and coronal images were also obtained.  NASCET Criteria was utilized for evaluation of potential vascular stenosis in the neck.  This exam was performed according to our departmental dose-optimization program, which includes automated exposure control, adjustment of the mA and/or KV according to the patient's size and/or use of iterative reconstruction technique.  3-D reformats were generated and reviewed on an independent workstation and saved to PACS.  CONTRAST: 60 mL Isovue-370,  COMPARISON: Noncontrast CT of the head from 02/16/2022  HISTORY: 88 years old Female with Stroke/TIA, assess extracranial arteries Stroke/TIA, assess intracranial arteries.  FINDINGS: CTA HEAD: No proximal filling defect is seen within the middle, anterior, and posterior cerebral arteries.  There are short segments areas at least 50% stenosis at the middle, anterior, and posterior cerebral artery, likely due to atherosclerosis.  There is significant atherosclerotic calcification seen at the V4 segment of the right vertebral artery and portions of the basilar artery contributing to greater than 50% narrowing at the midportion of the V4 segment of the right vertebral artery measuring at least 5 to 6 mm and at the proximal basilar artery measuring at least 5 to 6 mm in length.  There is diffuse narrowing at the cavernous carotid artery secondary to atherosclerosis by at least 40-50%.  No obvious aneurysm is readily apparent.  The dural venous vasculature was not well opacified with contrast.  CTA NECK:  There is normal three-vessel  aortic arch morphology.  The brachiocephalic and proximal subclavian arteries are patent and normal in course and caliber. The common carotid arteries and proximal external carotid arteries are patent and normal in course and caliber. The cervical segments of the internal carotid arteries and carotid bulbs are patent and normal in course and caliber. There is atherosclerotic calcification at the carotid bulbs. The left vertebral artery is dominant. There is a short segment dissection noted at the left vertebral artery at level C6/C7. This measures approximately 6 to 7 mm in length.  There are mild compression deformities noted at C6-T1 demonstrating at least 25% loss of height. These are similar to prior imaging.   IMPRESSION: There are short segments areas at least 50% stenosis at the middle, anterior, and posterior cerebral artery, likely due to atherosclerosis. No obvious filling defect is seen.  There is diffuse narrowing at the cavernous carotid arteries.  There are areas of short segment stenosis at the right V4 segment of the vertebral artery and proximal basilar artery.  There is a short segment dissection noted at the left vertebral artery at level C6/C7.  No acute intracranial hemorrhage is seen.  If there is persistent clinical concern for a neurologic deficit, consider MRI brain with diffusion-weighted sequences for further evaluation.  MRI HEAD WO W IV CONTRAST  INDICATION: Syncope, simple, normal neuro exam  COMPARISON: None Available at Time of Dictation  TECHNIQUE: Imaging of the brain was performed in 3 planes utilizing a combination of T1, FLAIR, and T2 weighting. Diffusion images were performed. In addition, sagittal and axial T1-weighted sequences were performed after intravenous injection of contrast. CONTRAST: 5 mL of GADOBUTROL 1 MMOL/ML IV SOLN.  FINDINGS: Patchy periventricular and deep white matter T2/FLAIR hyperintensities are nonspecific but compatible  with mild chronic microangiopathic changes. Mild/moderate brain parenchymal volume loss. Old lacunar infarcts within the left cerebellar hemisphere. There are multiple small areas of susceptibility artifact in bilateral cerebral hemispheres with a peripheral distribution is compatible with old microhemorrhages and which can be seen with cerebral amyloid angiopathy. No abnormally restricted diffusion to suggest acute or subacute infarction. 4 mm enhancing lesion off of the right aspect of the falx cerebrum suspicious for a meningioma. No abnormal extra-axial fluid collection.  No hydrocephalus. The larger intracranial vessels demonstrate normal flow voids.  The included paranasal sinuses are predominantly clear. The mastoid air cells are clear. Normal marrow signal pattern.   IMPRESSION: 1. No acute intracranial abnormality. 2. There are multiple small areas of susceptibility artifact in bilateral cerebral hemispheres with a peripheral distribution is compatible with old microhemorrhages and which can be seen with cerebral amyloid angiopathy. 3. 4 mm enhancing lesion off of the right aspect of the falx cerebrum suspicious for a meningioma. 4. Chronic findings as above.      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  Given that she is anticoagulated for atrial fibrillation this should be sufficient therapy.  She can see me on an as-needed basis.     Waynetta Sandy MD [facility]

## 2022-04-09 DIAGNOSIS — B961 Klebsiella pneumoniae [K. pneumoniae] as the cause of diseases classified elsewhere: Secondary | ICD-10-CM | POA: Diagnosis not present

## 2022-04-09 DIAGNOSIS — M79604 Pain in right leg: Secondary | ICD-10-CM | POA: Diagnosis not present

## 2022-04-09 DIAGNOSIS — M25551 Pain in right hip: Secondary | ICD-10-CM | POA: Diagnosis not present

## 2022-04-09 DIAGNOSIS — B3731 Acute candidiasis of vulva and vagina: Secondary | ICD-10-CM | POA: Diagnosis not present

## 2022-04-09 DIAGNOSIS — N2889 Other specified disorders of kidney and ureter: Secondary | ICD-10-CM | POA: Diagnosis not present

## 2022-04-09 DIAGNOSIS — N281 Cyst of kidney, acquired: Secondary | ICD-10-CM | POA: Diagnosis not present

## 2022-04-09 DIAGNOSIS — Z20822 Contact with and (suspected) exposure to covid-19: Secondary | ICD-10-CM | POA: Diagnosis not present

## 2022-04-09 DIAGNOSIS — I6523 Occlusion and stenosis of bilateral carotid arteries: Secondary | ICD-10-CM | POA: Diagnosis not present

## 2022-04-09 DIAGNOSIS — K760 Fatty (change of) liver, not elsewhere classified: Secondary | ICD-10-CM | POA: Diagnosis not present

## 2022-04-09 DIAGNOSIS — I4892 Unspecified atrial flutter: Secondary | ICD-10-CM | POA: Diagnosis not present

## 2022-04-09 DIAGNOSIS — J101 Influenza due to other identified influenza virus with other respiratory manifestations: Secondary | ICD-10-CM | POA: Diagnosis not present

## 2022-04-09 DIAGNOSIS — R531 Weakness: Secondary | ICD-10-CM | POA: Diagnosis not present

## 2022-04-09 DIAGNOSIS — Z90710 Acquired absence of both cervix and uterus: Secondary | ICD-10-CM | POA: Diagnosis not present

## 2022-04-09 DIAGNOSIS — M545 Low back pain, unspecified: Secondary | ICD-10-CM | POA: Diagnosis not present

## 2022-04-09 DIAGNOSIS — I951 Orthostatic hypotension: Secondary | ICD-10-CM | POA: Diagnosis not present

## 2022-04-09 DIAGNOSIS — M25562 Pain in left knee: Secondary | ICD-10-CM | POA: Diagnosis not present

## 2022-04-09 DIAGNOSIS — N3281 Overactive bladder: Secondary | ICD-10-CM | POA: Diagnosis not present

## 2022-04-09 DIAGNOSIS — R609 Edema, unspecified: Secondary | ICD-10-CM | POA: Diagnosis not present

## 2022-04-09 DIAGNOSIS — I10 Essential (primary) hypertension: Secondary | ICD-10-CM | POA: Diagnosis not present

## 2022-04-09 DIAGNOSIS — B9620 Unspecified Escherichia coli [E. coli] as the cause of diseases classified elsewhere: Secondary | ICD-10-CM | POA: Diagnosis not present

## 2022-04-09 DIAGNOSIS — N3000 Acute cystitis without hematuria: Secondary | ICD-10-CM | POA: Diagnosis not present

## 2022-04-09 DIAGNOSIS — I482 Chronic atrial fibrillation, unspecified: Secondary | ICD-10-CM | POA: Diagnosis not present

## 2022-04-09 DIAGNOSIS — R262 Difficulty in walking, not elsewhere classified: Secondary | ICD-10-CM | POA: Diagnosis not present

## 2022-04-09 DIAGNOSIS — I651 Occlusion and stenosis of basilar artery: Secondary | ICD-10-CM | POA: Diagnosis not present

## 2022-04-09 DIAGNOSIS — M79605 Pain in left leg: Secondary | ICD-10-CM | POA: Diagnosis not present

## 2022-04-09 DIAGNOSIS — M1712 Unilateral primary osteoarthritis, left knee: Secondary | ICD-10-CM | POA: Diagnosis not present

## 2022-04-09 DIAGNOSIS — I6503 Occlusion and stenosis of bilateral vertebral arteries: Secondary | ICD-10-CM | POA: Diagnosis not present

## 2022-04-10 DIAGNOSIS — I951 Orthostatic hypotension: Secondary | ICD-10-CM | POA: Diagnosis not present

## 2022-04-10 DIAGNOSIS — R531 Weakness: Secondary | ICD-10-CM | POA: Diagnosis not present

## 2022-04-10 DIAGNOSIS — N3000 Acute cystitis without hematuria: Secondary | ICD-10-CM | POA: Diagnosis not present

## 2022-04-10 DIAGNOSIS — M25562 Pain in left knee: Secondary | ICD-10-CM | POA: Diagnosis not present

## 2022-04-10 DIAGNOSIS — E1165 Type 2 diabetes mellitus with hyperglycemia: Secondary | ICD-10-CM | POA: Diagnosis not present

## 2022-04-10 DIAGNOSIS — Z20822 Contact with and (suspected) exposure to covid-19: Secondary | ICD-10-CM | POA: Diagnosis not present

## 2022-04-11 DIAGNOSIS — E1165 Type 2 diabetes mellitus with hyperglycemia: Secondary | ICD-10-CM | POA: Diagnosis not present

## 2022-04-11 DIAGNOSIS — Z20822 Contact with and (suspected) exposure to covid-19: Secondary | ICD-10-CM | POA: Diagnosis not present

## 2022-04-11 DIAGNOSIS — N3000 Acute cystitis without hematuria: Secondary | ICD-10-CM | POA: Diagnosis not present

## 2022-04-11 DIAGNOSIS — I951 Orthostatic hypotension: Secondary | ICD-10-CM | POA: Diagnosis not present

## 2022-04-11 DIAGNOSIS — M25562 Pain in left knee: Secondary | ICD-10-CM | POA: Diagnosis not present

## 2022-04-11 DIAGNOSIS — R531 Weakness: Secondary | ICD-10-CM | POA: Diagnosis not present

## 2022-04-12 DIAGNOSIS — R42 Dizziness and giddiness: Secondary | ICD-10-CM | POA: Diagnosis not present

## 2022-04-12 DIAGNOSIS — I4892 Unspecified atrial flutter: Secondary | ICD-10-CM | POA: Diagnosis not present

## 2022-04-12 DIAGNOSIS — J101 Influenza due to other identified influenza virus with other respiratory manifestations: Secondary | ICD-10-CM | POA: Diagnosis not present

## 2022-04-12 DIAGNOSIS — I4819 Other persistent atrial fibrillation: Secondary | ICD-10-CM | POA: Diagnosis not present

## 2022-04-12 DIAGNOSIS — N390 Urinary tract infection, site not specified: Secondary | ICD-10-CM | POA: Diagnosis not present

## 2022-04-12 DIAGNOSIS — I10 Essential (primary) hypertension: Secondary | ICD-10-CM | POA: Diagnosis not present

## 2022-04-12 DIAGNOSIS — R509 Fever, unspecified: Secondary | ICD-10-CM | POA: Diagnosis not present

## 2022-04-12 DIAGNOSIS — Z743 Need for continuous supervision: Secondary | ICD-10-CM | POA: Diagnosis not present

## 2022-04-12 DIAGNOSIS — M1712 Unilateral primary osteoarthritis, left knee: Secondary | ICD-10-CM | POA: Diagnosis not present

## 2022-04-12 DIAGNOSIS — N3281 Overactive bladder: Secondary | ICD-10-CM | POA: Diagnosis not present

## 2022-04-12 DIAGNOSIS — I482 Chronic atrial fibrillation, unspecified: Secondary | ICD-10-CM | POA: Diagnosis not present

## 2022-04-12 DIAGNOSIS — B961 Klebsiella pneumoniae [K. pneumoniae] as the cause of diseases classified elsewhere: Secondary | ICD-10-CM | POA: Diagnosis not present

## 2022-04-12 DIAGNOSIS — I700 Atherosclerosis of aorta: Secondary | ICD-10-CM | POA: Diagnosis not present

## 2022-04-12 DIAGNOSIS — R531 Weakness: Secondary | ICD-10-CM | POA: Diagnosis not present

## 2022-04-12 DIAGNOSIS — N3000 Acute cystitis without hematuria: Secondary | ICD-10-CM | POA: Diagnosis not present

## 2022-04-12 DIAGNOSIS — I959 Hypotension, unspecified: Secondary | ICD-10-CM | POA: Diagnosis not present

## 2022-04-12 DIAGNOSIS — A499 Bacterial infection, unspecified: Secondary | ICD-10-CM | POA: Diagnosis not present

## 2022-04-12 DIAGNOSIS — B3731 Acute candidiasis of vulva and vagina: Secondary | ICD-10-CM | POA: Diagnosis not present

## 2022-04-12 DIAGNOSIS — Z7901 Long term (current) use of anticoagulants: Secondary | ICD-10-CM | POA: Diagnosis not present

## 2022-04-12 DIAGNOSIS — B9620 Unspecified Escherichia coli [E. coli] as the cause of diseases classified elsewhere: Secondary | ICD-10-CM | POA: Diagnosis not present

## 2022-04-12 DIAGNOSIS — M25562 Pain in left knee: Secondary | ICD-10-CM | POA: Diagnosis not present

## 2022-04-12 DIAGNOSIS — E785 Hyperlipidemia, unspecified: Secondary | ICD-10-CM | POA: Diagnosis not present

## 2022-04-12 DIAGNOSIS — Z7409 Other reduced mobility: Secondary | ICD-10-CM | POA: Diagnosis not present

## 2022-04-12 DIAGNOSIS — I480 Paroxysmal atrial fibrillation: Secondary | ICD-10-CM | POA: Diagnosis not present

## 2022-04-12 DIAGNOSIS — I951 Orthostatic hypotension: Secondary | ICD-10-CM | POA: Diagnosis not present

## 2022-04-12 DIAGNOSIS — F0392 Unspecified dementia, unspecified severity, with psychotic disturbance: Secondary | ICD-10-CM | POA: Diagnosis not present

## 2022-04-12 DIAGNOSIS — E1165 Type 2 diabetes mellitus with hyperglycemia: Secondary | ICD-10-CM | POA: Diagnosis not present

## 2022-04-12 DIAGNOSIS — J09X2 Influenza due to identified novel influenza A virus with other respiratory manifestations: Secondary | ICD-10-CM | POA: Diagnosis not present

## 2022-04-12 DIAGNOSIS — R55 Syncope and collapse: Secondary | ICD-10-CM | POA: Diagnosis not present

## 2022-04-12 DIAGNOSIS — Z20822 Contact with and (suspected) exposure to covid-19: Secondary | ICD-10-CM | POA: Diagnosis not present

## 2022-04-14 ENCOUNTER — Other Ambulatory Visit: Payer: Self-pay | Admitting: Family Medicine

## 2022-04-18 ENCOUNTER — Other Ambulatory Visit: Payer: Self-pay | Admitting: Physician Assistant

## 2022-04-25 DIAGNOSIS — E785 Hyperlipidemia, unspecified: Secondary | ICD-10-CM | POA: Diagnosis not present

## 2022-04-25 DIAGNOSIS — M1712 Unilateral primary osteoarthritis, left knee: Secondary | ICD-10-CM | POA: Diagnosis not present

## 2022-04-25 DIAGNOSIS — F0392 Unspecified dementia, unspecified severity, with psychotic disturbance: Secondary | ICD-10-CM | POA: Diagnosis not present

## 2022-04-25 DIAGNOSIS — I480 Paroxysmal atrial fibrillation: Secondary | ICD-10-CM | POA: Diagnosis not present

## 2022-04-25 DIAGNOSIS — I959 Hypotension, unspecified: Secondary | ICD-10-CM | POA: Diagnosis not present

## 2022-04-25 DIAGNOSIS — J09X2 Influenza due to identified novel influenza A virus with other respiratory manifestations: Secondary | ICD-10-CM | POA: Diagnosis not present

## 2022-04-25 DIAGNOSIS — N390 Urinary tract infection, site not specified: Secondary | ICD-10-CM | POA: Diagnosis not present

## 2022-04-25 DIAGNOSIS — I4891 Unspecified atrial fibrillation: Secondary | ICD-10-CM | POA: Diagnosis not present

## 2022-04-25 DIAGNOSIS — I951 Orthostatic hypotension: Secondary | ICD-10-CM | POA: Diagnosis not present

## 2022-04-25 DIAGNOSIS — N3281 Overactive bladder: Secondary | ICD-10-CM | POA: Diagnosis not present

## 2022-04-25 DIAGNOSIS — E119 Type 2 diabetes mellitus without complications: Secondary | ICD-10-CM | POA: Diagnosis not present

## 2022-04-25 DIAGNOSIS — Z7901 Long term (current) use of anticoagulants: Secondary | ICD-10-CM | POA: Diagnosis not present

## 2022-04-25 DIAGNOSIS — Z9181 History of falling: Secondary | ICD-10-CM | POA: Diagnosis not present

## 2022-04-25 DIAGNOSIS — I10 Essential (primary) hypertension: Secondary | ICD-10-CM | POA: Diagnosis not present

## 2022-04-25 DIAGNOSIS — R260 Ataxic gait: Secondary | ICD-10-CM | POA: Diagnosis not present

## 2022-04-25 DIAGNOSIS — R2681 Unsteadiness on feet: Secondary | ICD-10-CM | POA: Diagnosis not present

## 2022-04-25 DIAGNOSIS — N179 Acute kidney failure, unspecified: Secondary | ICD-10-CM | POA: Diagnosis not present

## 2022-04-25 DIAGNOSIS — M6281 Muscle weakness (generalized): Secondary | ICD-10-CM | POA: Diagnosis not present

## 2022-04-25 DIAGNOSIS — M25562 Pain in left knee: Secondary | ICD-10-CM | POA: Diagnosis not present

## 2022-04-25 DIAGNOSIS — Z743 Need for continuous supervision: Secondary | ICD-10-CM | POA: Diagnosis not present

## 2022-04-26 DIAGNOSIS — I480 Paroxysmal atrial fibrillation: Secondary | ICD-10-CM | POA: Diagnosis not present

## 2022-04-26 DIAGNOSIS — R260 Ataxic gait: Secondary | ICD-10-CM | POA: Diagnosis not present

## 2022-04-26 DIAGNOSIS — Z7901 Long term (current) use of anticoagulants: Secondary | ICD-10-CM | POA: Diagnosis not present

## 2022-04-26 DIAGNOSIS — N3281 Overactive bladder: Secondary | ICD-10-CM | POA: Diagnosis not present

## 2022-05-12 ENCOUNTER — Ambulatory Visit: Payer: Medicare PPO | Admitting: Family Medicine

## 2022-05-19 DIAGNOSIS — I10 Essential (primary) hypertension: Secondary | ICD-10-CM | POA: Diagnosis not present

## 2022-05-19 DIAGNOSIS — E119 Type 2 diabetes mellitus without complications: Secondary | ICD-10-CM | POA: Diagnosis not present

## 2022-05-19 DIAGNOSIS — I4891 Unspecified atrial fibrillation: Secondary | ICD-10-CM | POA: Diagnosis not present

## 2022-05-19 DIAGNOSIS — N390 Urinary tract infection, site not specified: Secondary | ICD-10-CM | POA: Diagnosis not present

## 2022-05-19 DIAGNOSIS — R1311 Dysphagia, oral phase: Secondary | ICD-10-CM | POA: Diagnosis not present

## 2022-05-19 DIAGNOSIS — I480 Paroxysmal atrial fibrillation: Secondary | ICD-10-CM | POA: Diagnosis not present

## 2022-05-20 DIAGNOSIS — N390 Urinary tract infection, site not specified: Secondary | ICD-10-CM | POA: Diagnosis not present

## 2022-05-20 DIAGNOSIS — I10 Essential (primary) hypertension: Secondary | ICD-10-CM | POA: Diagnosis not present

## 2022-05-20 DIAGNOSIS — I480 Paroxysmal atrial fibrillation: Secondary | ICD-10-CM | POA: Diagnosis not present

## 2022-05-20 DIAGNOSIS — R1311 Dysphagia, oral phase: Secondary | ICD-10-CM | POA: Diagnosis not present

## 2022-05-20 DIAGNOSIS — E119 Type 2 diabetes mellitus without complications: Secondary | ICD-10-CM | POA: Diagnosis not present

## 2022-05-21 ENCOUNTER — Encounter (HOSPITAL_COMMUNITY): Payer: Self-pay | Admitting: *Deleted

## 2022-05-21 DIAGNOSIS — E119 Type 2 diabetes mellitus without complications: Secondary | ICD-10-CM | POA: Diagnosis not present

## 2022-05-21 DIAGNOSIS — I480 Paroxysmal atrial fibrillation: Secondary | ICD-10-CM | POA: Diagnosis not present

## 2022-05-21 DIAGNOSIS — I10 Essential (primary) hypertension: Secondary | ICD-10-CM | POA: Diagnosis not present

## 2022-05-21 DIAGNOSIS — N390 Urinary tract infection, site not specified: Secondary | ICD-10-CM | POA: Diagnosis not present

## 2022-05-21 DIAGNOSIS — R1311 Dysphagia, oral phase: Secondary | ICD-10-CM | POA: Diagnosis not present

## 2022-05-22 DIAGNOSIS — N390 Urinary tract infection, site not specified: Secondary | ICD-10-CM | POA: Diagnosis not present

## 2022-05-22 DIAGNOSIS — I10 Essential (primary) hypertension: Secondary | ICD-10-CM | POA: Diagnosis not present

## 2022-05-22 DIAGNOSIS — E119 Type 2 diabetes mellitus without complications: Secondary | ICD-10-CM | POA: Diagnosis not present

## 2022-05-22 DIAGNOSIS — I480 Paroxysmal atrial fibrillation: Secondary | ICD-10-CM | POA: Diagnosis not present

## 2022-05-22 DIAGNOSIS — R1311 Dysphagia, oral phase: Secondary | ICD-10-CM | POA: Diagnosis not present

## 2022-05-25 DIAGNOSIS — I10 Essential (primary) hypertension: Secondary | ICD-10-CM | POA: Diagnosis not present

## 2022-05-25 DIAGNOSIS — N390 Urinary tract infection, site not specified: Secondary | ICD-10-CM | POA: Diagnosis not present

## 2022-05-25 DIAGNOSIS — E119 Type 2 diabetes mellitus without complications: Secondary | ICD-10-CM | POA: Diagnosis not present

## 2022-05-25 DIAGNOSIS — R1311 Dysphagia, oral phase: Secondary | ICD-10-CM | POA: Diagnosis not present

## 2022-05-25 DIAGNOSIS — I480 Paroxysmal atrial fibrillation: Secondary | ICD-10-CM | POA: Diagnosis not present

## 2022-05-26 DIAGNOSIS — I10 Essential (primary) hypertension: Secondary | ICD-10-CM | POA: Diagnosis not present

## 2022-05-26 DIAGNOSIS — R1311 Dysphagia, oral phase: Secondary | ICD-10-CM | POA: Diagnosis not present

## 2022-05-26 DIAGNOSIS — I480 Paroxysmal atrial fibrillation: Secondary | ICD-10-CM | POA: Diagnosis not present

## 2022-05-26 DIAGNOSIS — N390 Urinary tract infection, site not specified: Secondary | ICD-10-CM | POA: Diagnosis not present

## 2022-05-26 DIAGNOSIS — E119 Type 2 diabetes mellitus without complications: Secondary | ICD-10-CM | POA: Diagnosis not present

## 2022-05-27 DIAGNOSIS — I10 Essential (primary) hypertension: Secondary | ICD-10-CM | POA: Diagnosis not present

## 2022-05-27 DIAGNOSIS — E119 Type 2 diabetes mellitus without complications: Secondary | ICD-10-CM | POA: Diagnosis not present

## 2022-05-27 DIAGNOSIS — R1311 Dysphagia, oral phase: Secondary | ICD-10-CM | POA: Diagnosis not present

## 2022-05-27 DIAGNOSIS — N390 Urinary tract infection, site not specified: Secondary | ICD-10-CM | POA: Diagnosis not present

## 2022-05-27 DIAGNOSIS — I480 Paroxysmal atrial fibrillation: Secondary | ICD-10-CM | POA: Diagnosis not present

## 2022-05-28 DIAGNOSIS — R1311 Dysphagia, oral phase: Secondary | ICD-10-CM | POA: Diagnosis not present

## 2022-05-28 DIAGNOSIS — E119 Type 2 diabetes mellitus without complications: Secondary | ICD-10-CM | POA: Diagnosis not present

## 2022-05-28 DIAGNOSIS — I10 Essential (primary) hypertension: Secondary | ICD-10-CM | POA: Diagnosis not present

## 2022-05-28 DIAGNOSIS — N390 Urinary tract infection, site not specified: Secondary | ICD-10-CM | POA: Diagnosis not present

## 2022-05-28 DIAGNOSIS — I480 Paroxysmal atrial fibrillation: Secondary | ICD-10-CM | POA: Diagnosis not present

## 2022-06-01 DIAGNOSIS — E119 Type 2 diabetes mellitus without complications: Secondary | ICD-10-CM | POA: Diagnosis not present

## 2022-06-01 DIAGNOSIS — I480 Paroxysmal atrial fibrillation: Secondary | ICD-10-CM | POA: Diagnosis not present

## 2022-06-01 DIAGNOSIS — N390 Urinary tract infection, site not specified: Secondary | ICD-10-CM | POA: Diagnosis not present

## 2022-06-01 DIAGNOSIS — I10 Essential (primary) hypertension: Secondary | ICD-10-CM | POA: Diagnosis not present

## 2022-06-01 DIAGNOSIS — R1311 Dysphagia, oral phase: Secondary | ICD-10-CM | POA: Diagnosis not present

## 2022-06-02 DIAGNOSIS — R1311 Dysphagia, oral phase: Secondary | ICD-10-CM | POA: Diagnosis not present

## 2022-06-02 DIAGNOSIS — N390 Urinary tract infection, site not specified: Secondary | ICD-10-CM | POA: Diagnosis not present

## 2022-06-02 DIAGNOSIS — E119 Type 2 diabetes mellitus without complications: Secondary | ICD-10-CM | POA: Diagnosis not present

## 2022-06-02 DIAGNOSIS — I10 Essential (primary) hypertension: Secondary | ICD-10-CM | POA: Diagnosis not present

## 2022-06-02 DIAGNOSIS — I480 Paroxysmal atrial fibrillation: Secondary | ICD-10-CM | POA: Diagnosis not present

## 2022-06-04 DIAGNOSIS — E119 Type 2 diabetes mellitus without complications: Secondary | ICD-10-CM | POA: Diagnosis not present

## 2022-06-04 DIAGNOSIS — N390 Urinary tract infection, site not specified: Secondary | ICD-10-CM | POA: Diagnosis not present

## 2022-06-04 DIAGNOSIS — I10 Essential (primary) hypertension: Secondary | ICD-10-CM | POA: Diagnosis not present

## 2022-06-04 DIAGNOSIS — I480 Paroxysmal atrial fibrillation: Secondary | ICD-10-CM | POA: Diagnosis not present

## 2022-06-04 DIAGNOSIS — R1311 Dysphagia, oral phase: Secondary | ICD-10-CM | POA: Diagnosis not present

## 2022-06-08 DIAGNOSIS — R1311 Dysphagia, oral phase: Secondary | ICD-10-CM | POA: Diagnosis not present

## 2022-06-08 DIAGNOSIS — I10 Essential (primary) hypertension: Secondary | ICD-10-CM | POA: Diagnosis not present

## 2022-06-08 DIAGNOSIS — N390 Urinary tract infection, site not specified: Secondary | ICD-10-CM | POA: Diagnosis not present

## 2022-06-08 DIAGNOSIS — E119 Type 2 diabetes mellitus without complications: Secondary | ICD-10-CM | POA: Diagnosis not present

## 2022-06-08 DIAGNOSIS — I480 Paroxysmal atrial fibrillation: Secondary | ICD-10-CM | POA: Diagnosis not present

## 2022-06-11 DIAGNOSIS — I480 Paroxysmal atrial fibrillation: Secondary | ICD-10-CM | POA: Diagnosis not present

## 2022-06-11 DIAGNOSIS — Z9181 History of falling: Secondary | ICD-10-CM | POA: Diagnosis not present

## 2022-06-11 DIAGNOSIS — M6281 Muscle weakness (generalized): Secondary | ICD-10-CM | POA: Diagnosis not present

## 2022-06-11 DIAGNOSIS — E785 Hyperlipidemia, unspecified: Secondary | ICD-10-CM | POA: Diagnosis not present

## 2022-06-11 DIAGNOSIS — I10 Essential (primary) hypertension: Secondary | ICD-10-CM | POA: Diagnosis not present

## 2022-06-11 DIAGNOSIS — R2681 Unsteadiness on feet: Secondary | ICD-10-CM | POA: Diagnosis not present

## 2022-06-11 DIAGNOSIS — N179 Acute kidney failure, unspecified: Secondary | ICD-10-CM | POA: Diagnosis not present

## 2022-06-12 ENCOUNTER — Telehealth: Payer: Self-pay | Admitting: Family Medicine

## 2022-06-12 NOTE — Telephone Encounter (Addendum)
Pt's daughter (Ms. Jimmye Norman) called to FU on the completion of the FMLA papers that were faxed over a week ago.  Daughter would like a call back to discuss.  [phone]   Daughter states she will pick documents up, once paperwork is completed.  Also, Daughter would like to confirm receipt of FMLA paperwork.   Daughter states FMLA paperwork is due by Monday - 06/15/22  Please advise.

## 2022-06-12 NOTE — Telephone Encounter (Signed)
Waitng on Dr Sarajane Jews to complete FMLA paperwork, will notify pt daughter

## 2022-06-15 ENCOUNTER — Other Ambulatory Visit (HOSPITAL_COMMUNITY): Payer: Self-pay | Admitting: *Deleted

## 2022-06-15 MED ORDER — METOPROLOL TARTRATE 25 MG PO TABS: 12.5000 mg | ORAL_TABLET | Freq: Two times a day (BID) | ORAL | 0 refills | Status: AC

## 2022-06-15 NOTE — Telephone Encounter (Signed)
Spoke with pt daughter, advised that FMLA forms have been completed and faxed as requested, stated that she received the fax and everything seems fine, state that if she needs further info she will call th office. FMLA papers are at my desk.

## 2022-07-11 DIAGNOSIS — E785 Hyperlipidemia, unspecified: Secondary | ICD-10-CM | POA: Diagnosis not present

## 2022-07-11 DIAGNOSIS — I480 Paroxysmal atrial fibrillation: Secondary | ICD-10-CM | POA: Diagnosis not present

## 2022-07-11 DIAGNOSIS — R2681 Unsteadiness on feet: Secondary | ICD-10-CM | POA: Diagnosis not present

## 2022-07-11 DIAGNOSIS — I10 Essential (primary) hypertension: Secondary | ICD-10-CM | POA: Diagnosis not present

## 2022-07-11 DIAGNOSIS — M6281 Muscle weakness (generalized): Secondary | ICD-10-CM | POA: Diagnosis not present

## 2022-07-11 DIAGNOSIS — Z9181 History of falling: Secondary | ICD-10-CM | POA: Diagnosis not present

## 2022-07-11 DIAGNOSIS — N179 Acute kidney failure, unspecified: Secondary | ICD-10-CM | POA: Diagnosis not present

## 2022-07-21 ENCOUNTER — Telehealth: Payer: Self-pay | Admitting: Family Medicine

## 2022-07-21 NOTE — Telephone Encounter (Signed)
Contacted [name] to schedule their annual wellness visit. Call back at later date: 10/12/22  Rudell Cobb AWV direct phone # [phone]   Spoke with patient daughter to schedule AWV.  She stated patient is currently @ [facility] and she wasn't for sure if patient would be coming back home.  Call back later this year

## 2022-08-11 DIAGNOSIS — E785 Hyperlipidemia, unspecified: Secondary | ICD-10-CM | POA: Diagnosis not present

## 2022-08-11 DIAGNOSIS — Z9181 History of falling: Secondary | ICD-10-CM | POA: Diagnosis not present

## 2022-08-11 DIAGNOSIS — M6281 Muscle weakness (generalized): Secondary | ICD-10-CM | POA: Diagnosis not present

## 2022-08-11 DIAGNOSIS — N179 Acute kidney failure, unspecified: Secondary | ICD-10-CM | POA: Diagnosis not present

## 2022-08-11 DIAGNOSIS — I10 Essential (primary) hypertension: Secondary | ICD-10-CM | POA: Diagnosis not present

## 2022-08-11 DIAGNOSIS — R2681 Unsteadiness on feet: Secondary | ICD-10-CM | POA: Diagnosis not present

## 2022-08-11 DIAGNOSIS — I480 Paroxysmal atrial fibrillation: Secondary | ICD-10-CM | POA: Diagnosis not present

## 2022-08-26 DIAGNOSIS — I10 Essential (primary) hypertension: Secondary | ICD-10-CM | POA: Diagnosis not present

## 2022-08-26 DIAGNOSIS — I4891 Unspecified atrial fibrillation: Secondary | ICD-10-CM | POA: Diagnosis not present

## 2022-08-26 DIAGNOSIS — N390 Urinary tract infection, site not specified: Secondary | ICD-10-CM | POA: Diagnosis not present

## 2022-08-26 DIAGNOSIS — E119 Type 2 diabetes mellitus without complications: Secondary | ICD-10-CM | POA: Diagnosis not present

## 2022-09-02 DIAGNOSIS — I10 Essential (primary) hypertension: Secondary | ICD-10-CM | POA: Diagnosis not present

## 2022-09-02 DIAGNOSIS — Z79899 Other long term (current) drug therapy: Secondary | ICD-10-CM | POA: Diagnosis not present

## 2022-09-02 DIAGNOSIS — N390 Urinary tract infection, site not specified: Secondary | ICD-10-CM | POA: Diagnosis not present

## 2022-09-10 DIAGNOSIS — E785 Hyperlipidemia, unspecified: Secondary | ICD-10-CM | POA: Diagnosis not present

## 2022-09-10 DIAGNOSIS — Z9181 History of falling: Secondary | ICD-10-CM | POA: Diagnosis not present

## 2022-09-10 DIAGNOSIS — I480 Paroxysmal atrial fibrillation: Secondary | ICD-10-CM | POA: Diagnosis not present

## 2022-09-10 DIAGNOSIS — M6281 Muscle weakness (generalized): Secondary | ICD-10-CM | POA: Diagnosis not present

## 2022-09-10 DIAGNOSIS — N179 Acute kidney failure, unspecified: Secondary | ICD-10-CM | POA: Diagnosis not present

## 2022-09-10 DIAGNOSIS — R2681 Unsteadiness on feet: Secondary | ICD-10-CM | POA: Diagnosis not present

## 2022-09-10 DIAGNOSIS — I10 Essential (primary) hypertension: Secondary | ICD-10-CM | POA: Diagnosis not present

## 2022-10-04 DIAGNOSIS — Z79899 Other long term (current) drug therapy: Secondary | ICD-10-CM | POA: Diagnosis not present

## 2022-10-08 DIAGNOSIS — F0392 Unspecified dementia, unspecified severity, with psychotic disturbance: Secondary | ICD-10-CM | POA: Diagnosis not present

## 2022-10-08 DIAGNOSIS — R1311 Dysphagia, oral phase: Secondary | ICD-10-CM | POA: Diagnosis not present

## 2022-10-09 DIAGNOSIS — R1311 Dysphagia, oral phase: Secondary | ICD-10-CM | POA: Diagnosis not present

## 2022-10-09 DIAGNOSIS — F0392 Unspecified dementia, unspecified severity, with psychotic disturbance: Secondary | ICD-10-CM | POA: Diagnosis not present

## 2022-10-11 DIAGNOSIS — I10 Essential (primary) hypertension: Secondary | ICD-10-CM | POA: Diagnosis not present

## 2022-10-11 DIAGNOSIS — R2681 Unsteadiness on feet: Secondary | ICD-10-CM | POA: Diagnosis not present

## 2022-10-11 DIAGNOSIS — I480 Paroxysmal atrial fibrillation: Secondary | ICD-10-CM | POA: Diagnosis not present

## 2022-10-11 DIAGNOSIS — M6281 Muscle weakness (generalized): Secondary | ICD-10-CM | POA: Diagnosis not present

## 2022-10-11 DIAGNOSIS — N179 Acute kidney failure, unspecified: Secondary | ICD-10-CM | POA: Diagnosis not present

## 2022-10-11 DIAGNOSIS — Z9181 History of falling: Secondary | ICD-10-CM | POA: Diagnosis not present

## 2022-10-11 DIAGNOSIS — E785 Hyperlipidemia, unspecified: Secondary | ICD-10-CM | POA: Diagnosis not present

## 2022-10-15 DIAGNOSIS — R278 Other lack of coordination: Secondary | ICD-10-CM | POA: Diagnosis not present

## 2022-10-15 DIAGNOSIS — R1311 Dysphagia, oral phase: Secondary | ICD-10-CM | POA: Diagnosis not present

## 2022-10-15 DIAGNOSIS — R2681 Unsteadiness on feet: Secondary | ICD-10-CM | POA: Diagnosis not present

## 2022-10-15 DIAGNOSIS — M6281 Muscle weakness (generalized): Secondary | ICD-10-CM | POA: Diagnosis not present

## 2022-10-15 DIAGNOSIS — F0392 Unspecified dementia, unspecified severity, with psychotic disturbance: Secondary | ICD-10-CM | POA: Diagnosis not present

## 2022-10-16 DIAGNOSIS — R278 Other lack of coordination: Secondary | ICD-10-CM | POA: Diagnosis not present

## 2022-10-16 DIAGNOSIS — M6281 Muscle weakness (generalized): Secondary | ICD-10-CM | POA: Diagnosis not present

## 2022-10-16 DIAGNOSIS — R1311 Dysphagia, oral phase: Secondary | ICD-10-CM | POA: Diagnosis not present

## 2022-10-16 DIAGNOSIS — R2681 Unsteadiness on feet: Secondary | ICD-10-CM | POA: Diagnosis not present

## 2022-10-16 DIAGNOSIS — F0392 Unspecified dementia, unspecified severity, with psychotic disturbance: Secondary | ICD-10-CM | POA: Diagnosis not present

## 2022-10-19 DIAGNOSIS — R2681 Unsteadiness on feet: Secondary | ICD-10-CM | POA: Diagnosis not present

## 2022-10-19 DIAGNOSIS — F0392 Unspecified dementia, unspecified severity, with psychotic disturbance: Secondary | ICD-10-CM | POA: Diagnosis not present

## 2022-10-19 DIAGNOSIS — R1311 Dysphagia, oral phase: Secondary | ICD-10-CM | POA: Diagnosis not present

## 2022-10-19 DIAGNOSIS — R278 Other lack of coordination: Secondary | ICD-10-CM | POA: Diagnosis not present

## 2022-10-19 DIAGNOSIS — M6281 Muscle weakness (generalized): Secondary | ICD-10-CM | POA: Diagnosis not present

## 2022-10-20 DIAGNOSIS — F0392 Unspecified dementia, unspecified severity, with psychotic disturbance: Secondary | ICD-10-CM | POA: Diagnosis not present

## 2022-10-20 DIAGNOSIS — R2681 Unsteadiness on feet: Secondary | ICD-10-CM | POA: Diagnosis not present

## 2022-10-20 DIAGNOSIS — M6281 Muscle weakness (generalized): Secondary | ICD-10-CM | POA: Diagnosis not present

## 2022-10-20 DIAGNOSIS — R278 Other lack of coordination: Secondary | ICD-10-CM | POA: Diagnosis not present

## 2022-10-20 DIAGNOSIS — R1311 Dysphagia, oral phase: Secondary | ICD-10-CM | POA: Diagnosis not present

## 2022-10-21 DIAGNOSIS — R1311 Dysphagia, oral phase: Secondary | ICD-10-CM | POA: Diagnosis not present

## 2022-10-21 DIAGNOSIS — F0392 Unspecified dementia, unspecified severity, with psychotic disturbance: Secondary | ICD-10-CM | POA: Diagnosis not present

## 2022-10-21 DIAGNOSIS — M6281 Muscle weakness (generalized): Secondary | ICD-10-CM | POA: Diagnosis not present

## 2022-10-21 DIAGNOSIS — R278 Other lack of coordination: Secondary | ICD-10-CM | POA: Diagnosis not present

## 2022-10-21 DIAGNOSIS — R2681 Unsteadiness on feet: Secondary | ICD-10-CM | POA: Diagnosis not present

## 2022-10-22 DIAGNOSIS — F0392 Unspecified dementia, unspecified severity, with psychotic disturbance: Secondary | ICD-10-CM | POA: Diagnosis not present

## 2022-10-22 DIAGNOSIS — R1311 Dysphagia, oral phase: Secondary | ICD-10-CM | POA: Diagnosis not present

## 2022-10-22 DIAGNOSIS — M6281 Muscle weakness (generalized): Secondary | ICD-10-CM | POA: Diagnosis not present

## 2022-10-22 DIAGNOSIS — R2681 Unsteadiness on feet: Secondary | ICD-10-CM | POA: Diagnosis not present

## 2022-10-22 DIAGNOSIS — R278 Other lack of coordination: Secondary | ICD-10-CM | POA: Diagnosis not present

## 2022-10-23 DIAGNOSIS — R2681 Unsteadiness on feet: Secondary | ICD-10-CM | POA: Diagnosis not present

## 2022-10-23 DIAGNOSIS — M6281 Muscle weakness (generalized): Secondary | ICD-10-CM | POA: Diagnosis not present

## 2022-10-23 DIAGNOSIS — R1311 Dysphagia, oral phase: Secondary | ICD-10-CM | POA: Diagnosis not present

## 2022-10-23 DIAGNOSIS — R278 Other lack of coordination: Secondary | ICD-10-CM | POA: Diagnosis not present

## 2022-10-23 DIAGNOSIS — F0392 Unspecified dementia, unspecified severity, with psychotic disturbance: Secondary | ICD-10-CM | POA: Diagnosis not present

## 2022-10-27 DIAGNOSIS — M6281 Muscle weakness (generalized): Secondary | ICD-10-CM | POA: Diagnosis not present

## 2022-10-27 DIAGNOSIS — R278 Other lack of coordination: Secondary | ICD-10-CM | POA: Diagnosis not present

## 2022-10-27 DIAGNOSIS — R2681 Unsteadiness on feet: Secondary | ICD-10-CM | POA: Diagnosis not present

## 2022-10-27 DIAGNOSIS — R1311 Dysphagia, oral phase: Secondary | ICD-10-CM | POA: Diagnosis not present

## 2022-10-27 DIAGNOSIS — F0392 Unspecified dementia, unspecified severity, with psychotic disturbance: Secondary | ICD-10-CM | POA: Diagnosis not present

## 2022-10-29 DIAGNOSIS — R2681 Unsteadiness on feet: Secondary | ICD-10-CM | POA: Diagnosis not present

## 2022-10-29 DIAGNOSIS — F0392 Unspecified dementia, unspecified severity, with psychotic disturbance: Secondary | ICD-10-CM | POA: Diagnosis not present

## 2022-10-29 DIAGNOSIS — R1311 Dysphagia, oral phase: Secondary | ICD-10-CM | POA: Diagnosis not present

## 2022-10-29 DIAGNOSIS — R278 Other lack of coordination: Secondary | ICD-10-CM | POA: Diagnosis not present

## 2022-10-29 DIAGNOSIS — M6281 Muscle weakness (generalized): Secondary | ICD-10-CM | POA: Diagnosis not present

## 2022-11-02 DIAGNOSIS — R278 Other lack of coordination: Secondary | ICD-10-CM | POA: Diagnosis not present

## 2022-11-02 DIAGNOSIS — M6281 Muscle weakness (generalized): Secondary | ICD-10-CM | POA: Diagnosis not present

## 2022-11-02 DIAGNOSIS — R2681 Unsteadiness on feet: Secondary | ICD-10-CM | POA: Diagnosis not present

## 2022-11-02 DIAGNOSIS — F0392 Unspecified dementia, unspecified severity, with psychotic disturbance: Secondary | ICD-10-CM | POA: Diagnosis not present

## 2022-11-02 DIAGNOSIS — R1311 Dysphagia, oral phase: Secondary | ICD-10-CM | POA: Diagnosis not present

## 2022-11-04 DIAGNOSIS — R2681 Unsteadiness on feet: Secondary | ICD-10-CM | POA: Diagnosis not present

## 2022-11-04 DIAGNOSIS — M6281 Muscle weakness (generalized): Secondary | ICD-10-CM | POA: Diagnosis not present

## 2022-11-04 DIAGNOSIS — R1311 Dysphagia, oral phase: Secondary | ICD-10-CM | POA: Diagnosis not present

## 2022-11-04 DIAGNOSIS — R278 Other lack of coordination: Secondary | ICD-10-CM | POA: Diagnosis not present

## 2022-11-04 DIAGNOSIS — F0392 Unspecified dementia, unspecified severity, with psychotic disturbance: Secondary | ICD-10-CM | POA: Diagnosis not present

## 2022-11-05 DIAGNOSIS — R278 Other lack of coordination: Secondary | ICD-10-CM | POA: Diagnosis not present

## 2022-11-05 DIAGNOSIS — M6281 Muscle weakness (generalized): Secondary | ICD-10-CM | POA: Diagnosis not present

## 2022-11-05 DIAGNOSIS — R2681 Unsteadiness on feet: Secondary | ICD-10-CM | POA: Diagnosis not present

## 2022-11-05 DIAGNOSIS — R1311 Dysphagia, oral phase: Secondary | ICD-10-CM | POA: Diagnosis not present

## 2022-11-05 DIAGNOSIS — F0392 Unspecified dementia, unspecified severity, with psychotic disturbance: Secondary | ICD-10-CM | POA: Diagnosis not present

## 2022-11-06 DIAGNOSIS — R2681 Unsteadiness on feet: Secondary | ICD-10-CM | POA: Diagnosis not present

## 2022-11-06 DIAGNOSIS — M6281 Muscle weakness (generalized): Secondary | ICD-10-CM | POA: Diagnosis not present

## 2022-11-06 DIAGNOSIS — R278 Other lack of coordination: Secondary | ICD-10-CM | POA: Diagnosis not present

## 2022-11-06 DIAGNOSIS — R1311 Dysphagia, oral phase: Secondary | ICD-10-CM | POA: Diagnosis not present

## 2022-11-06 DIAGNOSIS — F0392 Unspecified dementia, unspecified severity, with psychotic disturbance: Secondary | ICD-10-CM | POA: Diagnosis not present

## 2022-11-10 DIAGNOSIS — M6281 Muscle weakness (generalized): Secondary | ICD-10-CM | POA: Diagnosis not present

## 2022-11-10 DIAGNOSIS — I480 Paroxysmal atrial fibrillation: Secondary | ICD-10-CM | POA: Diagnosis not present

## 2022-11-10 DIAGNOSIS — R2681 Unsteadiness on feet: Secondary | ICD-10-CM | POA: Diagnosis not present

## 2022-11-10 DIAGNOSIS — Z9181 History of falling: Secondary | ICD-10-CM | POA: Diagnosis not present

## 2022-11-10 DIAGNOSIS — I10 Essential (primary) hypertension: Secondary | ICD-10-CM | POA: Diagnosis not present

## 2022-11-10 DIAGNOSIS — N179 Acute kidney failure, unspecified: Secondary | ICD-10-CM | POA: Diagnosis not present

## 2022-11-10 DIAGNOSIS — E785 Hyperlipidemia, unspecified: Secondary | ICD-10-CM | POA: Diagnosis not present

## 2022-11-18 DIAGNOSIS — Z79899 Other long term (current) drug therapy: Secondary | ICD-10-CM | POA: Diagnosis not present

## 2022-12-11 DIAGNOSIS — I480 Paroxysmal atrial fibrillation: Secondary | ICD-10-CM | POA: Diagnosis not present

## 2022-12-11 DIAGNOSIS — I10 Essential (primary) hypertension: Secondary | ICD-10-CM | POA: Diagnosis not present

## 2022-12-11 DIAGNOSIS — N179 Acute kidney failure, unspecified: Secondary | ICD-10-CM | POA: Diagnosis not present

## 2022-12-11 DIAGNOSIS — R2681 Unsteadiness on feet: Secondary | ICD-10-CM | POA: Diagnosis not present

## 2022-12-11 DIAGNOSIS — E785 Hyperlipidemia, unspecified: Secondary | ICD-10-CM | POA: Diagnosis not present

## 2022-12-11 DIAGNOSIS — M6281 Muscle weakness (generalized): Secondary | ICD-10-CM | POA: Diagnosis not present

## 2022-12-11 DIAGNOSIS — Z9181 History of falling: Secondary | ICD-10-CM | POA: Diagnosis not present

## 2023-01-11 DIAGNOSIS — N179 Acute kidney failure, unspecified: Secondary | ICD-10-CM | POA: Diagnosis not present

## 2023-01-11 DIAGNOSIS — E785 Hyperlipidemia, unspecified: Secondary | ICD-10-CM | POA: Diagnosis not present

## 2023-01-11 DIAGNOSIS — I480 Paroxysmal atrial fibrillation: Secondary | ICD-10-CM | POA: Diagnosis not present

## 2023-01-11 DIAGNOSIS — Z79899 Other long term (current) drug therapy: Secondary | ICD-10-CM | POA: Diagnosis not present

## 2023-01-11 DIAGNOSIS — Z9181 History of falling: Secondary | ICD-10-CM | POA: Diagnosis not present

## 2023-01-11 DIAGNOSIS — I10 Essential (primary) hypertension: Secondary | ICD-10-CM | POA: Diagnosis not present

## 2023-01-11 DIAGNOSIS — M6281 Muscle weakness (generalized): Secondary | ICD-10-CM | POA: Diagnosis not present

## 2023-01-11 DIAGNOSIS — R2681 Unsteadiness on feet: Secondary | ICD-10-CM | POA: Diagnosis not present

## 2023-01-28 DIAGNOSIS — R1312 Dysphagia, oropharyngeal phase: Secondary | ICD-10-CM | POA: Diagnosis not present

## 2023-01-28 DIAGNOSIS — Z8719 Personal history of other diseases of the digestive system: Secondary | ICD-10-CM | POA: Diagnosis not present

## 2023-01-28 DIAGNOSIS — I482 Chronic atrial fibrillation, unspecified: Secondary | ICD-10-CM | POA: Diagnosis not present

## 2023-01-28 DIAGNOSIS — N390 Urinary tract infection, site not specified: Secondary | ICD-10-CM | POA: Diagnosis not present

## 2023-01-28 DIAGNOSIS — E119 Type 2 diabetes mellitus without complications: Secondary | ICD-10-CM | POA: Diagnosis not present

## 2023-02-02 DIAGNOSIS — N390 Urinary tract infection, site not specified: Secondary | ICD-10-CM | POA: Diagnosis not present

## 2023-02-02 DIAGNOSIS — Z8719 Personal history of other diseases of the digestive system: Secondary | ICD-10-CM | POA: Diagnosis not present

## 2023-02-02 DIAGNOSIS — I482 Chronic atrial fibrillation, unspecified: Secondary | ICD-10-CM | POA: Diagnosis not present

## 2023-02-02 DIAGNOSIS — E119 Type 2 diabetes mellitus without complications: Secondary | ICD-10-CM | POA: Diagnosis not present

## 2023-02-02 DIAGNOSIS — R1312 Dysphagia, oropharyngeal phase: Secondary | ICD-10-CM | POA: Diagnosis not present

## 2023-02-03 DIAGNOSIS — R1312 Dysphagia, oropharyngeal phase: Secondary | ICD-10-CM | POA: Diagnosis not present

## 2023-02-03 DIAGNOSIS — E119 Type 2 diabetes mellitus without complications: Secondary | ICD-10-CM | POA: Diagnosis not present

## 2023-02-03 DIAGNOSIS — Z8719 Personal history of other diseases of the digestive system: Secondary | ICD-10-CM | POA: Diagnosis not present

## 2023-02-03 DIAGNOSIS — N390 Urinary tract infection, site not specified: Secondary | ICD-10-CM | POA: Diagnosis not present

## 2023-02-03 DIAGNOSIS — I482 Chronic atrial fibrillation, unspecified: Secondary | ICD-10-CM | POA: Diagnosis not present

## 2023-02-04 DIAGNOSIS — R1312 Dysphagia, oropharyngeal phase: Secondary | ICD-10-CM | POA: Diagnosis not present

## 2023-02-04 DIAGNOSIS — N390 Urinary tract infection, site not specified: Secondary | ICD-10-CM | POA: Diagnosis not present

## 2023-02-04 DIAGNOSIS — Z8719 Personal history of other diseases of the digestive system: Secondary | ICD-10-CM | POA: Diagnosis not present

## 2023-02-04 DIAGNOSIS — E119 Type 2 diabetes mellitus without complications: Secondary | ICD-10-CM | POA: Diagnosis not present

## 2023-02-04 DIAGNOSIS — I482 Chronic atrial fibrillation, unspecified: Secondary | ICD-10-CM | POA: Diagnosis not present

## 2023-02-05 DIAGNOSIS — N390 Urinary tract infection, site not specified: Secondary | ICD-10-CM | POA: Diagnosis not present

## 2023-02-05 DIAGNOSIS — E119 Type 2 diabetes mellitus without complications: Secondary | ICD-10-CM | POA: Diagnosis not present

## 2023-02-05 DIAGNOSIS — R1312 Dysphagia, oropharyngeal phase: Secondary | ICD-10-CM | POA: Diagnosis not present

## 2023-02-05 DIAGNOSIS — Z8719 Personal history of other diseases of the digestive system: Secondary | ICD-10-CM | POA: Diagnosis not present

## 2023-02-05 DIAGNOSIS — I482 Chronic atrial fibrillation, unspecified: Secondary | ICD-10-CM | POA: Diagnosis not present

## 2023-02-09 DIAGNOSIS — N390 Urinary tract infection, site not specified: Secondary | ICD-10-CM | POA: Diagnosis not present

## 2023-02-09 DIAGNOSIS — R1312 Dysphagia, oropharyngeal phase: Secondary | ICD-10-CM | POA: Diagnosis not present

## 2023-02-09 DIAGNOSIS — I482 Chronic atrial fibrillation, unspecified: Secondary | ICD-10-CM | POA: Diagnosis not present

## 2023-02-09 DIAGNOSIS — E119 Type 2 diabetes mellitus without complications: Secondary | ICD-10-CM | POA: Diagnosis not present

## 2023-02-09 DIAGNOSIS — Z8719 Personal history of other diseases of the digestive system: Secondary | ICD-10-CM | POA: Diagnosis not present

## 2023-02-10 DIAGNOSIS — E785 Hyperlipidemia, unspecified: Secondary | ICD-10-CM | POA: Diagnosis not present

## 2023-02-10 DIAGNOSIS — N179 Acute kidney failure, unspecified: Secondary | ICD-10-CM | POA: Diagnosis not present

## 2023-02-10 DIAGNOSIS — M6281 Muscle weakness (generalized): Secondary | ICD-10-CM | POA: Diagnosis not present

## 2023-02-10 DIAGNOSIS — Z9181 History of falling: Secondary | ICD-10-CM | POA: Diagnosis not present

## 2023-02-10 DIAGNOSIS — I480 Paroxysmal atrial fibrillation: Secondary | ICD-10-CM | POA: Diagnosis not present

## 2023-02-10 DIAGNOSIS — R2681 Unsteadiness on feet: Secondary | ICD-10-CM | POA: Diagnosis not present

## 2023-02-10 DIAGNOSIS — I10 Essential (primary) hypertension: Secondary | ICD-10-CM | POA: Diagnosis not present

## 2023-02-24 IMAGING — CT CT CERVICAL SPINE W/O CM
4 of 5 series · 13 of 33 positions shown, 15 images · non-contrast
Comparison: 10/19/2020

CLINICAL DATA: Fell a few days ago

EXAM:
CT CERVICAL SPINE WITHOUT CONTRAST
TECHNIQUE: Multidetector CT imaging of the cervical spine was performed without
intravenous contrast. Multiplanar CT image reconstructions were also
generated.

[Series 3: c spine bone · axial · 0.29mm/px · z∈[-227,-171]mm · 2 of 84 slices shown]
[im 28/84  bone]
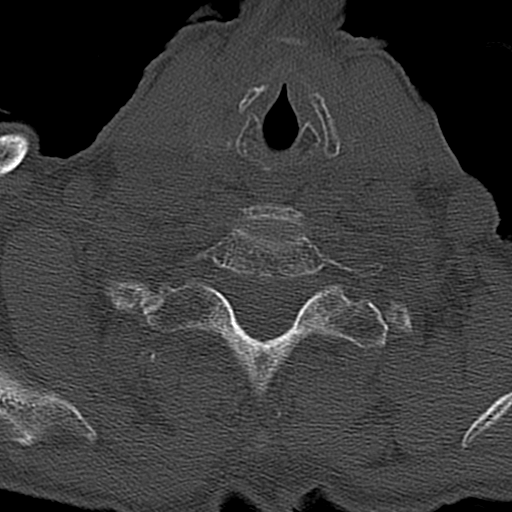
[im 56/84  bone]
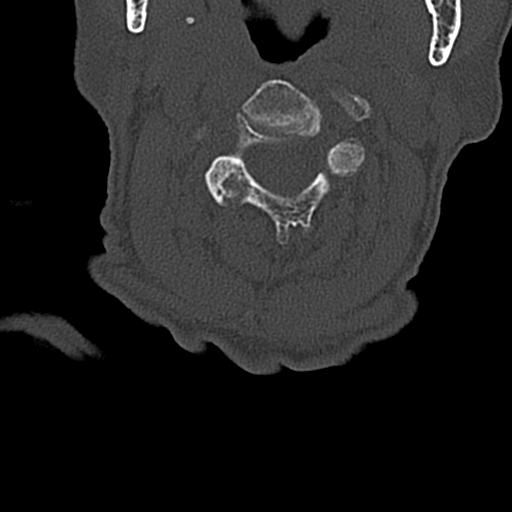

[Series 5: orthogonal bone · axial · 0.23mm/px · z∈[-262,-178]mm · 3 of 91 slices shown, 4 images]
[im 23/91  soft-tissue]
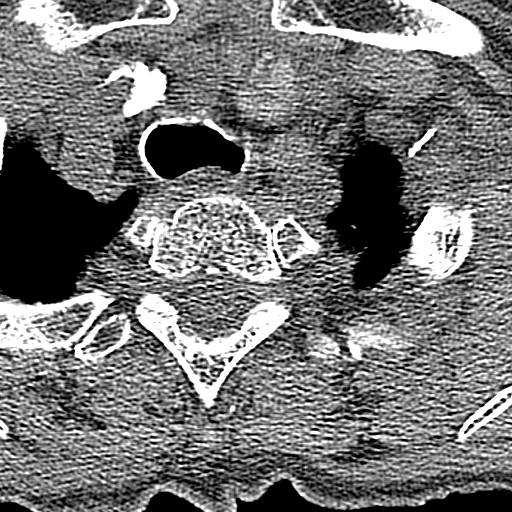
[im 23/91  bone]
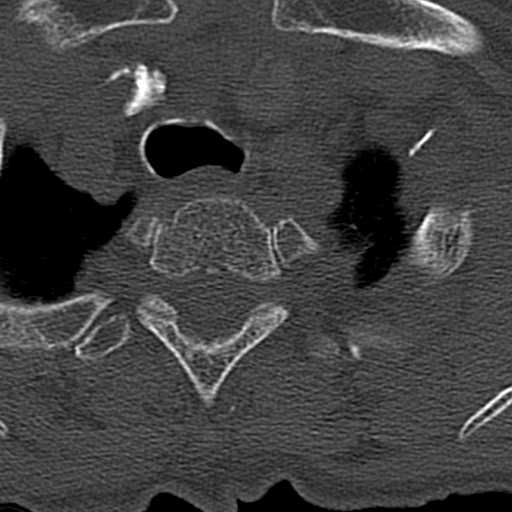
[im 46/91  bone]
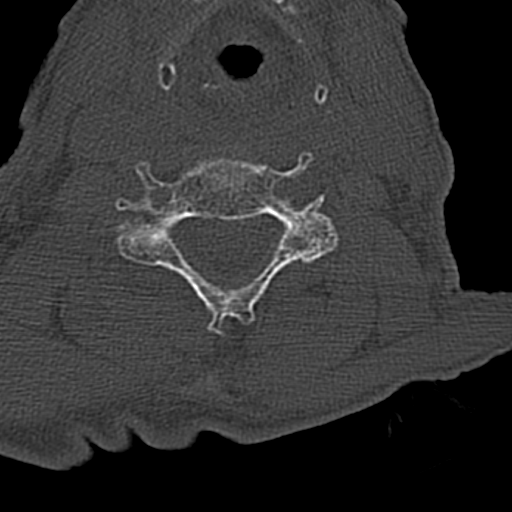
[im 68/91  bone]
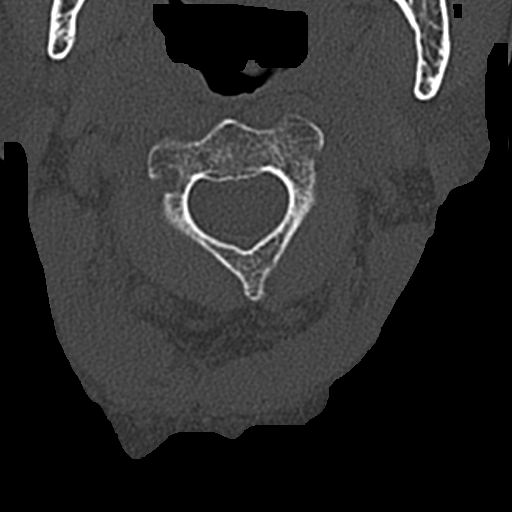

[Series 6: coronal bone · coronal · 0.24mm/px · 3 of 61 slices shown]
[im 13/61  bone]
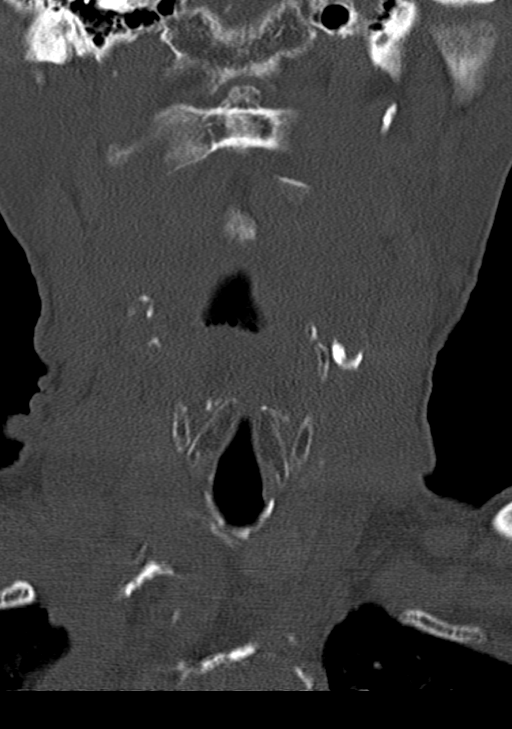
[im 25/61  bone]
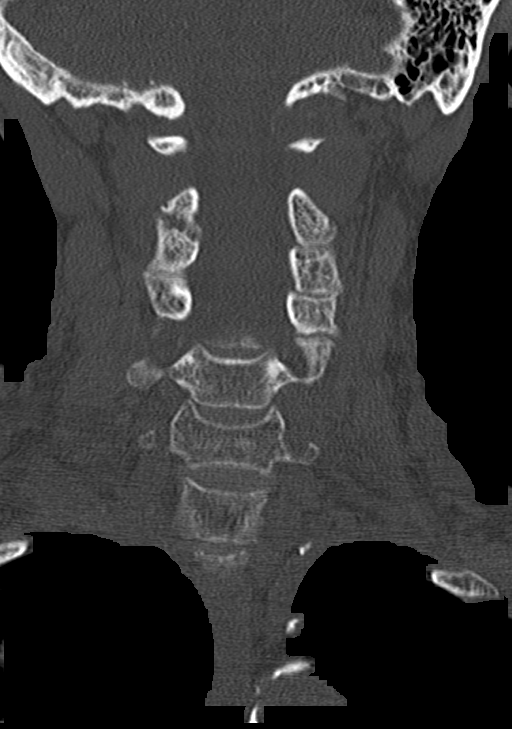
[im 37/61  bone]
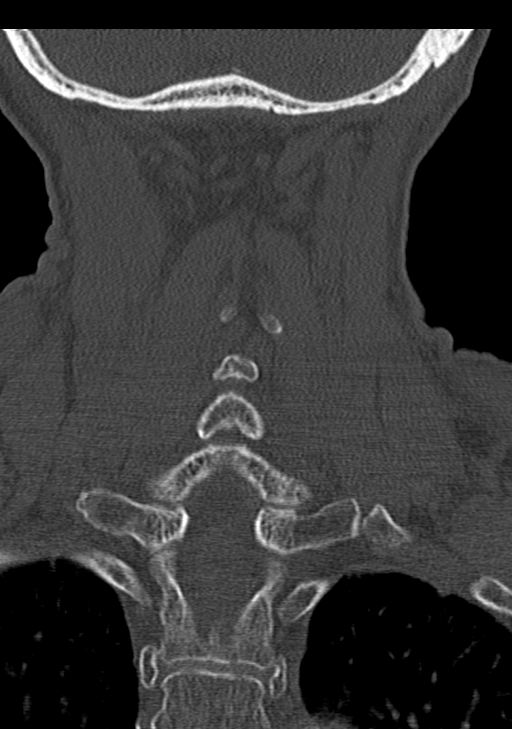

[Series 7: sagittal bone · sagittal · 0.28mm/px · 5 of 61 slices shown, 6 images]
[im 21/61  bone]
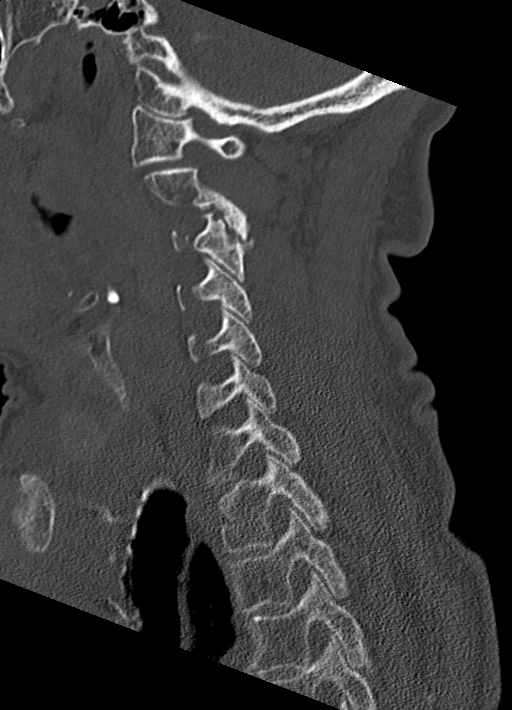
[im 26/61  bone]
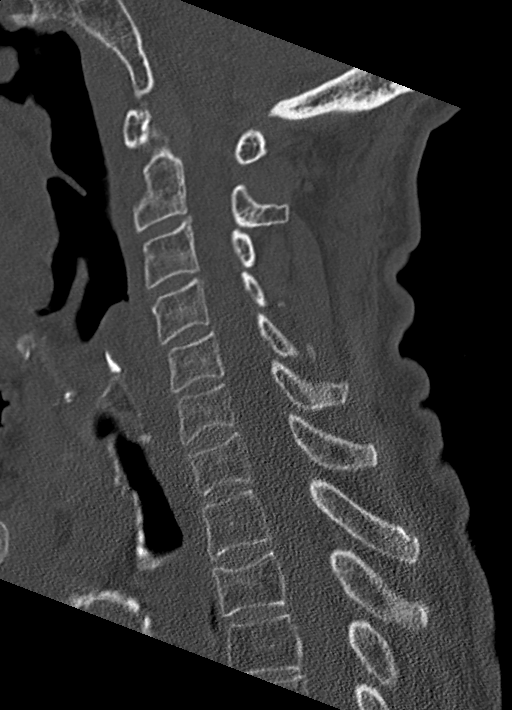
[im 31/61  soft-tissue]
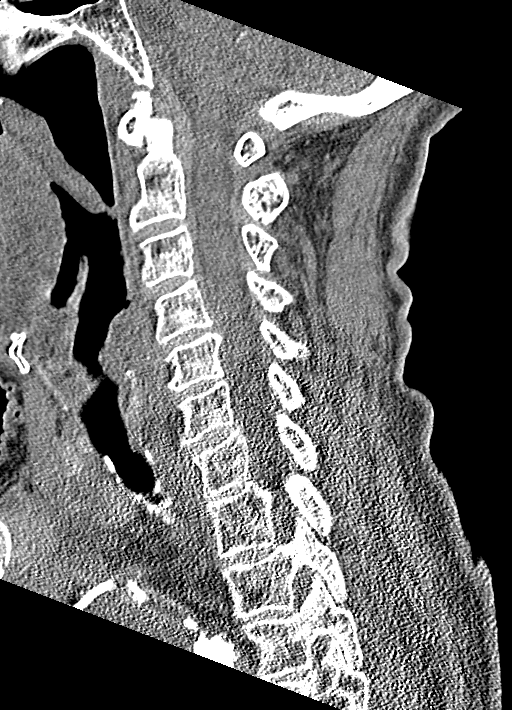
[im 31/61  bone]
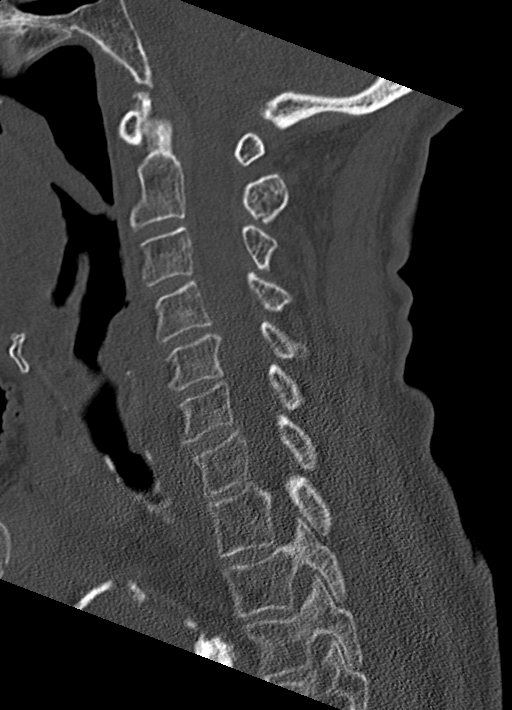
[im 36/61  bone]
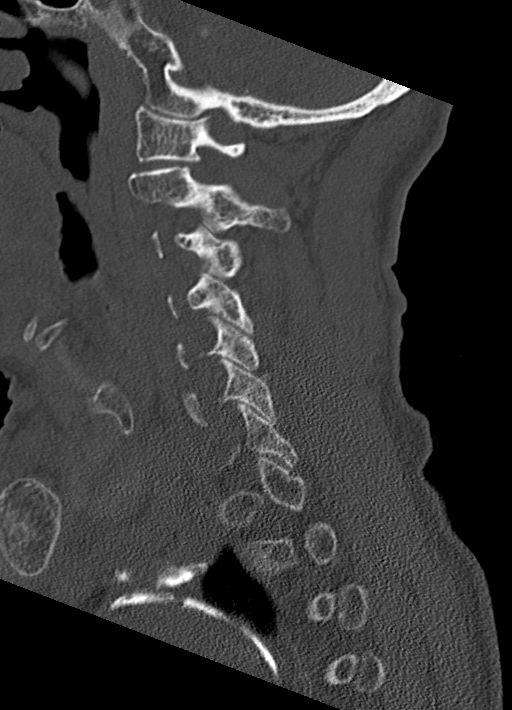
[im 41/61  bone]
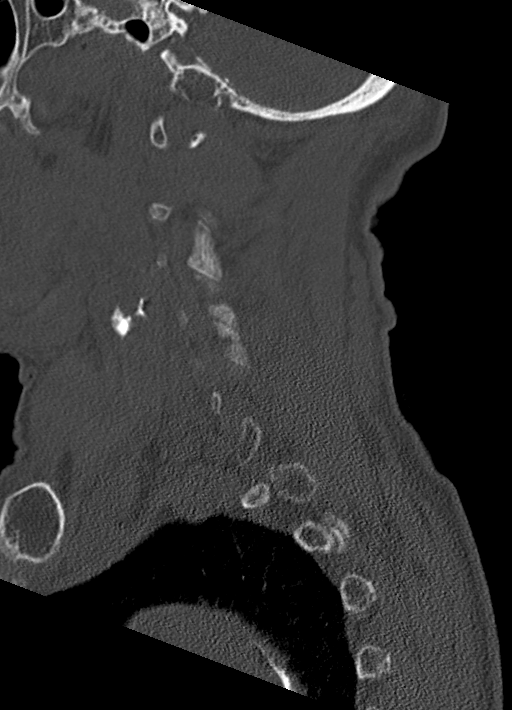

[13 of 33 positions shown; findings below may reference images not displayed]

FINDINGS: Alignment: No malalignment.

Skull base and vertebrae: No acute fracture. Old minimal superior
endplate depressions at C7 and T2.

Soft tissues and spinal canal: No traumatic soft tissue finding.
Carotid calcification.

Disc levels: No significant disc level degenerative disease. Facet
osteoarthritis on the right at C2-3 which could be painful.

Upper chest: Negative

Other: None
IMPRESSION: No acute or traumatic finding. Old minimal superior endplate
depressions at C7 and T2 as seen previously. Chronic facet
osteoarthritis on the right at C2-3.

## 2023-02-24 IMAGING — CR DG KNEE COMPLETE 4+V*R*
4 series · 4 of 4 positions shown · non-contrast
Comparison: None

CLINICAL DATA: Fall.  Pain

EXAM:
RIGHT KNEE - COMPLETE 4+ VIEW

[x knee ap right]
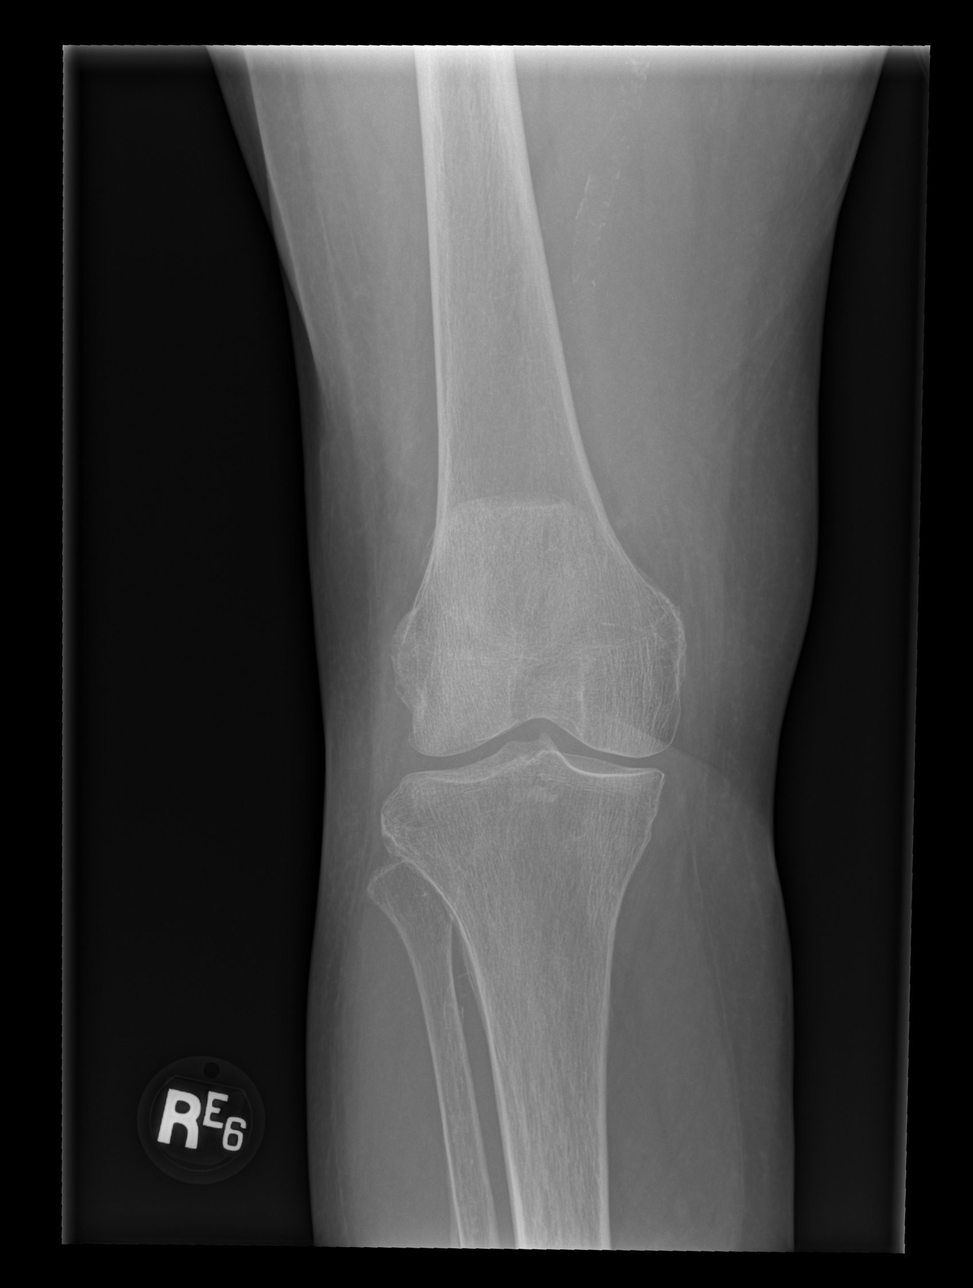

[x knee obl right (1 of 2)]
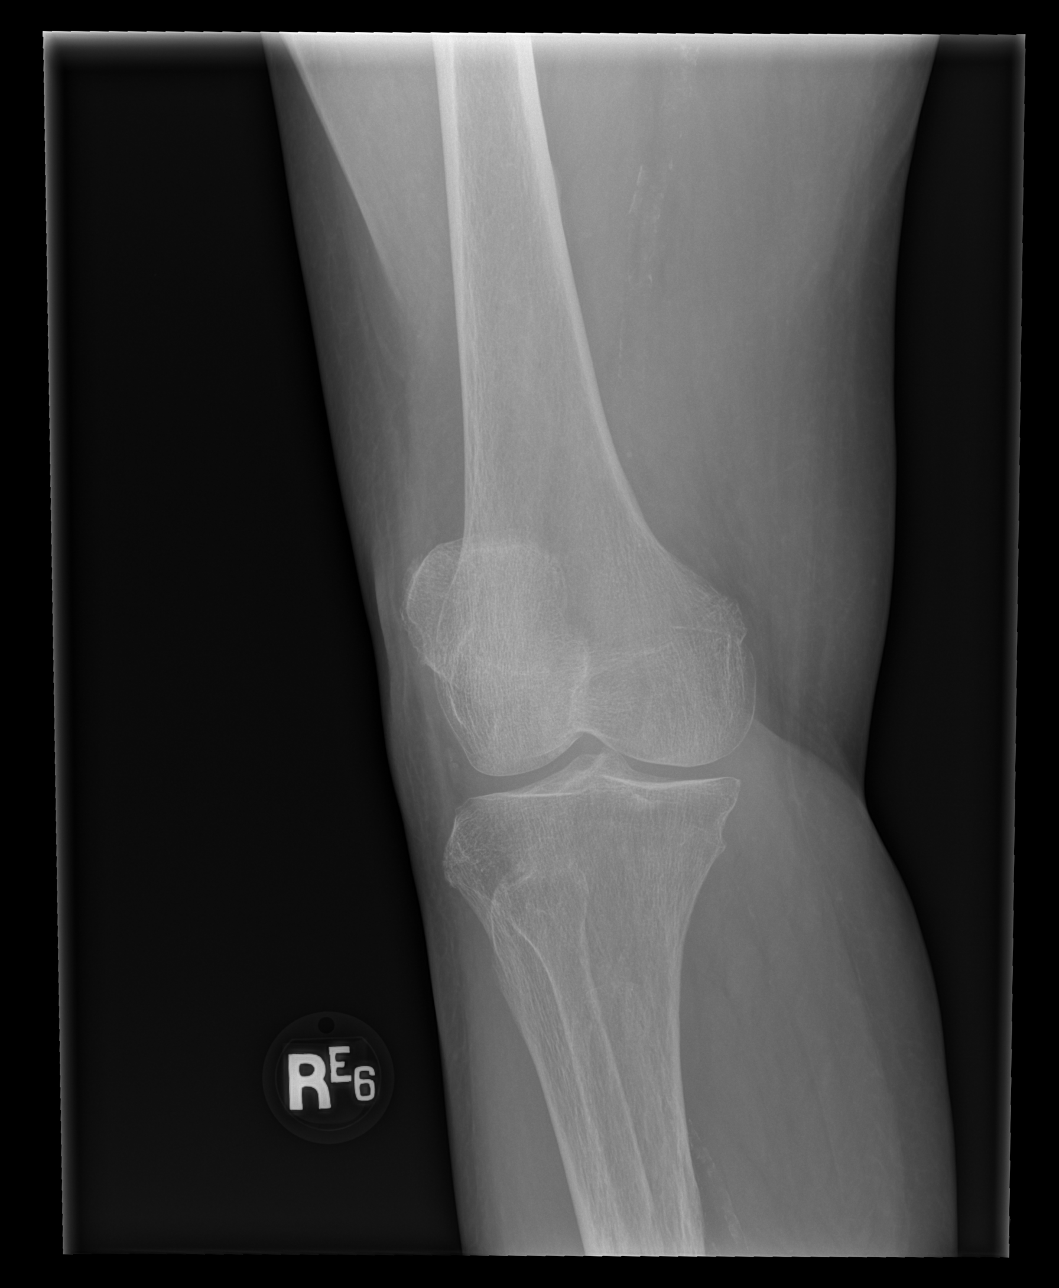

[x knee obl right (2 of 2)]
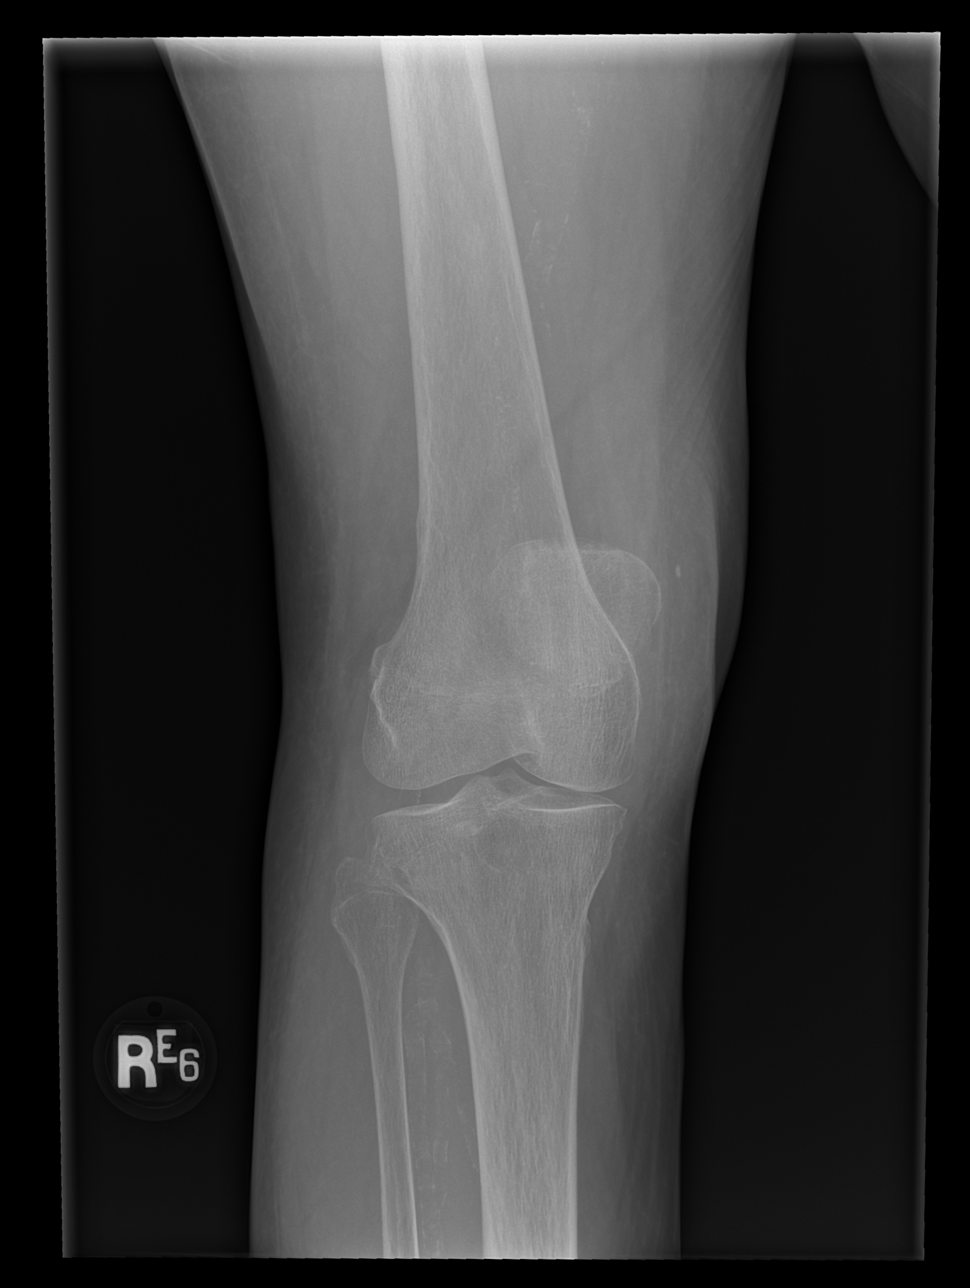

[x knee lat right]
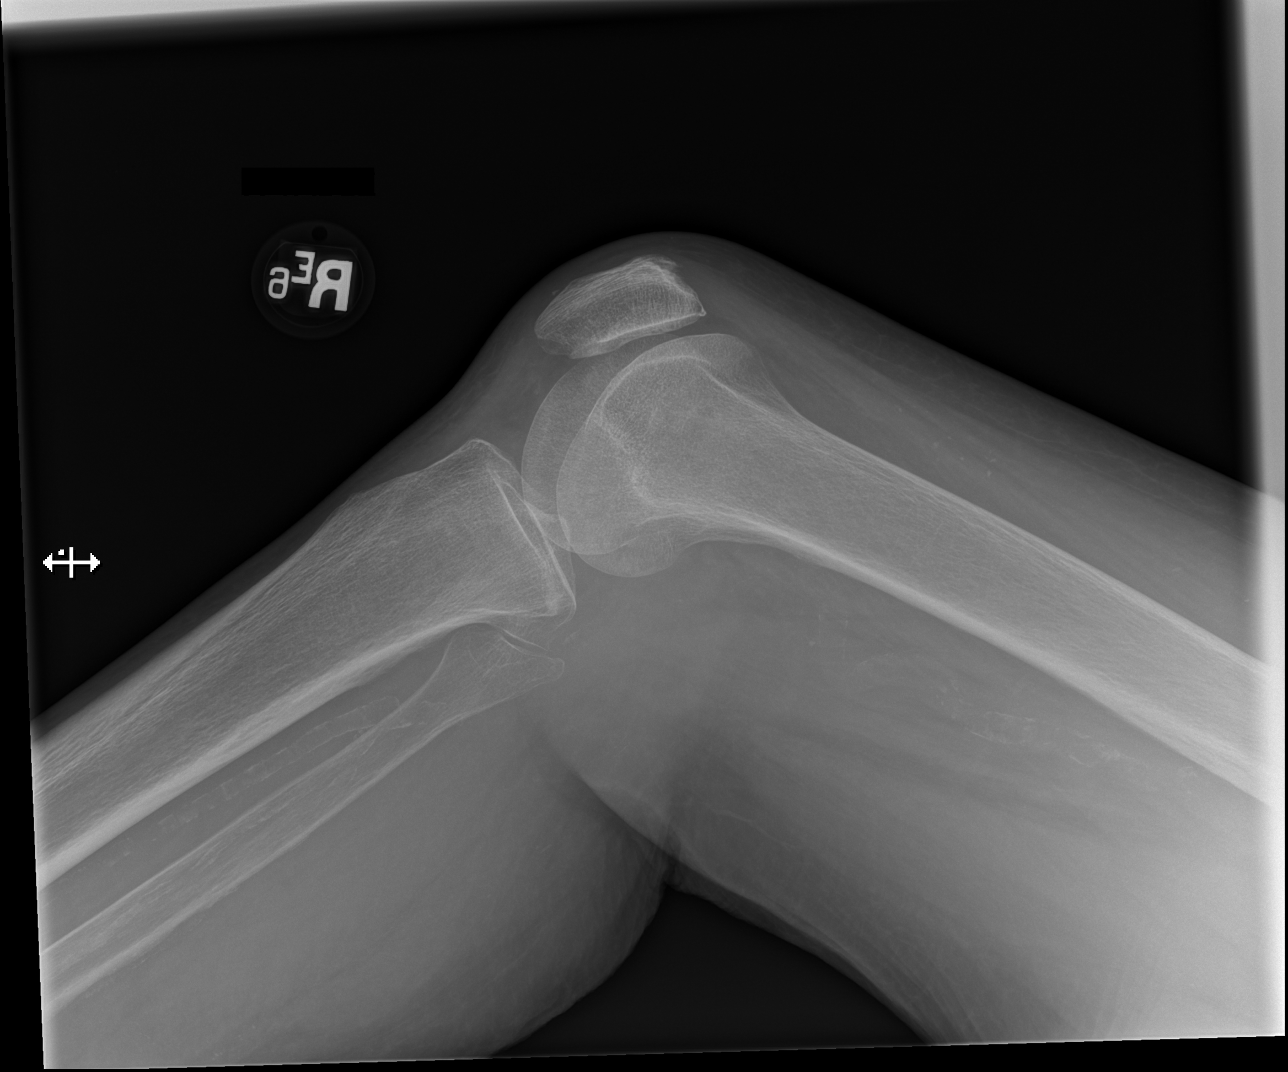

[4 of 4 positions shown; findings below may reference images not displayed]

FINDINGS: Mild diffuse osteopenia. No joint effusion. No signs of acute
fracture or dislocation. No significant arthropathy.
IMPRESSION: 1. No acute findings.
2. Osteopenia.

## 2023-02-25 DIAGNOSIS — L603 Nail dystrophy: Secondary | ICD-10-CM | POA: Diagnosis not present

## 2023-02-25 DIAGNOSIS — L84 Corns and callosities: Secondary | ICD-10-CM | POA: Diagnosis not present

## 2023-02-25 DIAGNOSIS — L602 Onychogryphosis: Secondary | ICD-10-CM | POA: Diagnosis not present

## 2023-02-25 DIAGNOSIS — I739 Peripheral vascular disease, unspecified: Secondary | ICD-10-CM | POA: Diagnosis not present

## 2023-03-13 DIAGNOSIS — I480 Paroxysmal atrial fibrillation: Secondary | ICD-10-CM | POA: Diagnosis not present

## 2023-03-13 DIAGNOSIS — Z9181 History of falling: Secondary | ICD-10-CM | POA: Diagnosis not present

## 2023-03-13 DIAGNOSIS — E785 Hyperlipidemia, unspecified: Secondary | ICD-10-CM | POA: Diagnosis not present

## 2023-03-13 DIAGNOSIS — M6281 Muscle weakness (generalized): Secondary | ICD-10-CM | POA: Diagnosis not present

## 2023-03-13 DIAGNOSIS — N179 Acute kidney failure, unspecified: Secondary | ICD-10-CM | POA: Diagnosis not present

## 2023-03-13 DIAGNOSIS — I10 Essential (primary) hypertension: Secondary | ICD-10-CM | POA: Diagnosis not present

## 2023-03-13 DIAGNOSIS — R2681 Unsteadiness on feet: Secondary | ICD-10-CM | POA: Diagnosis not present

## 2023-05-07 DIAGNOSIS — J09X9 Influenza due to identified novel influenza A virus with other manifestations: Secondary | ICD-10-CM | POA: Diagnosis not present

## 2023-05-07 DIAGNOSIS — Z79899 Other long term (current) drug therapy: Secondary | ICD-10-CM | POA: Diagnosis not present

## 2023-05-07 DIAGNOSIS — R5081 Fever presenting with conditions classified elsewhere: Secondary | ICD-10-CM | POA: Diagnosis not present

## 2023-06-11 ENCOUNTER — Observation Stay (HOSPITAL_COMMUNITY): Payer: Medicaid Other

## 2023-06-11 ENCOUNTER — Encounter (HOSPITAL_COMMUNITY): Payer: Self-pay | Admitting: Emergency Medicine

## 2023-06-11 ENCOUNTER — Other Ambulatory Visit: Payer: Self-pay

## 2023-06-11 ENCOUNTER — Inpatient Hospital Stay (HOSPITAL_COMMUNITY)
Admission: EM | Admit: 2023-06-11 | Discharge: 2023-06-15 | DRG: 378 | Disposition: A | Discharge status: Skilled Nursing Facility | Payer: Medicaid Other | Source: Skilled Nursing Facility | Attending: Family Medicine | Admitting: Family Medicine

## 2023-06-11 DIAGNOSIS — K5731 Diverticulosis of large intestine without perforation or abscess with bleeding: Principal | ICD-10-CM | POA: Diagnosis present

## 2023-06-11 DIAGNOSIS — E7800 Pure hypercholesterolemia, unspecified: Secondary | ICD-10-CM | POA: Diagnosis present

## 2023-06-11 DIAGNOSIS — R251 Tremor, unspecified: Secondary | ICD-10-CM | POA: Diagnosis present

## 2023-06-11 DIAGNOSIS — Z8249 Family history of ischemic heart disease and other diseases of the circulatory system: Secondary | ICD-10-CM | POA: Diagnosis not present

## 2023-06-11 DIAGNOSIS — K625 Hemorrhage of anus and rectum: Secondary | ICD-10-CM | POA: Diagnosis present

## 2023-06-11 DIAGNOSIS — Z90710 Acquired absence of both cervix and uterus: Secondary | ICD-10-CM | POA: Diagnosis not present

## 2023-06-11 DIAGNOSIS — R58 Hemorrhage, not elsewhere classified: Secondary | ICD-10-CM | POA: Diagnosis not present

## 2023-06-11 DIAGNOSIS — Z7901 Long term (current) use of anticoagulants: Secondary | ICD-10-CM

## 2023-06-11 DIAGNOSIS — I10 Essential (primary) hypertension: Secondary | ICD-10-CM | POA: Diagnosis present

## 2023-06-11 DIAGNOSIS — D62 Acute posthemorrhagic anemia: Secondary | ICD-10-CM | POA: Insufficient documentation

## 2023-06-11 DIAGNOSIS — D6832 Hemorrhagic disorder due to extrinsic circulating anticoagulants: Secondary | ICD-10-CM | POA: Diagnosis present

## 2023-06-11 DIAGNOSIS — R001 Bradycardia, unspecified: Secondary | ICD-10-CM | POA: Diagnosis present

## 2023-06-11 DIAGNOSIS — Z8619 Personal history of other infectious and parasitic diseases: Secondary | ICD-10-CM | POA: Diagnosis not present

## 2023-06-11 DIAGNOSIS — R569 Unspecified convulsions: Secondary | ICD-10-CM | POA: Diagnosis not present

## 2023-06-11 DIAGNOSIS — I480 Paroxysmal atrial fibrillation: Secondary | ICD-10-CM | POA: Diagnosis present

## 2023-06-11 DIAGNOSIS — R269 Unspecified abnormalities of gait and mobility: Secondary | ICD-10-CM | POA: Diagnosis not present

## 2023-06-11 DIAGNOSIS — E119 Type 2 diabetes mellitus without complications: Secondary | ICD-10-CM | POA: Diagnosis present

## 2023-06-11 DIAGNOSIS — R159 Full incontinence of feces: Secondary | ICD-10-CM | POA: Diagnosis present

## 2023-06-11 DIAGNOSIS — Z66 Do not resuscitate: Secondary | ICD-10-CM | POA: Diagnosis present

## 2023-06-11 DIAGNOSIS — I4892 Unspecified atrial flutter: Secondary | ICD-10-CM | POA: Diagnosis present

## 2023-06-11 DIAGNOSIS — Z96642 Presence of left artificial hip joint: Secondary | ICD-10-CM | POA: Diagnosis present

## 2023-06-11 DIAGNOSIS — F0390 Unspecified dementia without behavioral disturbance: Secondary | ICD-10-CM | POA: Diagnosis present

## 2023-06-11 DIAGNOSIS — K922 Gastrointestinal hemorrhage, unspecified: Principal | ICD-10-CM

## 2023-06-11 DIAGNOSIS — H409 Unspecified glaucoma: Secondary | ICD-10-CM | POA: Diagnosis present

## 2023-06-11 DIAGNOSIS — K921 Melena: Secondary | ICD-10-CM

## 2023-06-11 DIAGNOSIS — K5792 Diverticulitis of intestine, part unspecified, without perforation or abscess without bleeding: Secondary | ICD-10-CM | POA: Diagnosis not present

## 2023-06-11 DIAGNOSIS — Z888 Allergy status to other drugs, medicaments and biological substances status: Secondary | ICD-10-CM

## 2023-06-11 DIAGNOSIS — Z79899 Other long term (current) drug therapy: Secondary | ICD-10-CM | POA: Diagnosis not present

## 2023-06-11 DIAGNOSIS — R404 Transient alteration of awareness: Secondary | ICD-10-CM

## 2023-06-11 DIAGNOSIS — N3281 Overactive bladder: Secondary | ICD-10-CM | POA: Diagnosis not present

## 2023-06-11 DIAGNOSIS — R55 Syncope and collapse: Secondary | ICD-10-CM | POA: Diagnosis not present

## 2023-06-11 DIAGNOSIS — I639 Cerebral infarction, unspecified: Secondary | ICD-10-CM | POA: Diagnosis not present

## 2023-06-11 DIAGNOSIS — T45515A Adverse effect of anticoagulants, initial encounter: Secondary | ICD-10-CM | POA: Diagnosis present

## 2023-06-11 DIAGNOSIS — R531 Weakness: Secondary | ICD-10-CM | POA: Diagnosis not present

## 2023-06-11 DIAGNOSIS — I482 Chronic atrial fibrillation, unspecified: Secondary | ICD-10-CM | POA: Diagnosis not present

## 2023-06-11 DIAGNOSIS — Z880 Allergy status to penicillin: Secondary | ICD-10-CM

## 2023-06-11 DIAGNOSIS — I951 Orthostatic hypotension: Secondary | ICD-10-CM | POA: Diagnosis not present

## 2023-06-11 DIAGNOSIS — I6782 Cerebral ischemia: Secondary | ICD-10-CM | POA: Diagnosis not present

## 2023-06-11 DIAGNOSIS — Z634 Disappearance and death of family member: Secondary | ICD-10-CM | POA: Diagnosis not present

## 2023-06-11 DIAGNOSIS — Z7401 Bed confinement status: Secondary | ICD-10-CM | POA: Diagnosis not present

## 2023-06-11 DIAGNOSIS — I959 Hypotension, unspecified: Secondary | ICD-10-CM | POA: Diagnosis not present

## 2023-06-11 DIAGNOSIS — R0689 Other abnormalities of breathing: Secondary | ICD-10-CM | POA: Diagnosis not present

## 2023-06-11 LAB — BASIC METABOLIC PANEL
Anion gap: 11 (ref 5–15)
BUN: 14 mg/dL (ref 8–23)
CO2: 24 mmol/L (ref 22–32)
Calcium: 9.1 mg/dL (ref 8.9–10.3)
Chloride: 105 mmol/L (ref 98–111)
Creatinine, Ser: 0.68 mg/dL (ref 0.44–1.00)
GFR, Estimated: >60 mL/min (ref >60)
Glucose, Bld: 169 mg/dL — ABNORMAL HIGH (ref 70–99)
Potassium: 3.8 mmol/L (ref 3.5–5.1)
Sodium: 140 mmol/L (ref 135–145)

## 2023-06-11 LAB — CBC
HCT: 32.2 % — ABNORMAL LOW (ref 36.0–46.0)
Hemoglobin: 10.5 g/dL — ABNORMAL LOW (ref 12.0–15.0)
MCH: 30.3 pg (ref 26.0–34.0)
MCHC: 32.6 g/dL (ref 30.0–36.0)
MCV: 93.1 fL (ref 80.0–100.0)
Platelets: 162 10*3/uL (ref 150–400)
RBC: 3.46 MIL/uL — ABNORMAL LOW (ref 3.87–5.11)
RDW: 13.3 % (ref 11.5–15.5)
WBC: 5.8 10*3/uL (ref 4.0–10.5)
nRBC: 0 % (ref 0.0–0.2)

## 2023-06-11 LAB — HEMOGLOBIN AND HEMATOCRIT, BLOOD
HCT: 31 % — ABNORMAL LOW (ref 36.0–46.0)
HCT: 29.7 % — ABNORMAL LOW (ref 36.0–46.0)
HCT: 30.2 % — ABNORMAL LOW (ref 36.0–46.0)
Hemoglobin: 10.2 g/dL — ABNORMAL LOW (ref 12.0–15.0)
Hemoglobin: 10 g/dL — ABNORMAL LOW (ref 12.0–15.0)
Hemoglobin: 10.3 g/dL — ABNORMAL LOW (ref 12.0–15.0)

## 2023-06-11 LAB — PREPARE RBC (CROSSMATCH)

## 2023-06-11 LAB — GLUCOSE, CAPILLARY
Glucose-Capillary: 105 mg/dL — ABNORMAL HIGH (ref 70–99)
Glucose-Capillary: 194 mg/dL — ABNORMAL HIGH (ref 70–99)

## 2023-06-11 LAB — PROTIME-INR
INR: 1.2 (ref 0.8–1.2)
Prothrombin Time: 15.1 s (ref 11.4–15.2)

## 2023-06-11 MED ORDER — ONDANSETRON HCL 4 MG/2ML IJ SOLN: 4.0000 mg | Freq: Four times a day (QID) | INTRAMUSCULAR | Status: DC | PRN

## 2023-06-11 MED ORDER — ONDANSETRON HCL 4 MG PO TABS: 4.0000 mg | ORAL_TABLET | Freq: Four times a day (QID) | ORAL | Status: DC | PRN

## 2023-06-11 MED ORDER — ACETAMINOPHEN 325 MG PO TABS: 650.0000 mg | ORAL_TABLET | Freq: Four times a day (QID) | ORAL | Status: DC | PRN

## 2023-06-11 MED ORDER — ACETAMINOPHEN 650 MG RE SUPP: 650.0000 mg | Freq: Four times a day (QID) | RECTAL | Status: DC | PRN

## 2023-06-11 MED ORDER — SODIUM CHLORIDE 0.9% IV SOLUTION: Freq: Once | INTRAVENOUS | Status: DC

## 2023-06-11 MED ORDER — POLYETHYLENE GLYCOL 3350 17 G PO PACK: 17.0000 g | PACK | Freq: Every day | ORAL | Status: DC | PRN

## 2023-06-11 NOTE — TOC Initial Note (Addendum)
 Transition of Care [facility]) - Initial/Assessment Note    Patient Details  Name: [name] MRN: [id number] Date of Birth: [birth year]  Transition of Care [facility]) CM/SW Contact:    Marliss Coots, LCSW Phone Number: 06/11/2023, 3:41 PM  Clinical Narrative:                  3:41 PM Per chart review, patient is from [facility] LTC, oriented x3, and has HCPOA (daughter, Kyra Leyland) which is not documented on chart. CSW attempted to call [facility] regarding SNF and HCPOA. There was no response and a voicemail was left.  Expected Discharge Plan: Skilled Nursing Facility Barriers to Discharge: Continued Medical Work up   Patient Goals and CMS Choice            Expected Discharge Plan and Services In-house Referral: Clinical Social Work   Post Acute Care Choice: Skilled Nursing Facility Living arrangements for the past 2 months: Skilled Nursing Facility                                      Prior Living Arrangements/Services Living arrangements for the past 2 months: Skilled Nursing Facility Lives with:: Facility Resident Patient language and need for interpreter reviewed:: Yes        Need for Family Participation in Patient Care: Yes (Comment) Care giver support system in place?: Yes (comment)   Criminal Activity/Legal Involvement Pertinent to Current Situation/Hospitalization: No - Comment as needed  Activities of Daily Living   ADL Screening (condition at time of admission) Independently performs ADLs?: Yes (appropriate for developmental age) Is the patient deaf or have difficulty hearing?: No Does the patient have difficulty seeing, even when wearing glasses/contacts?: No Does the patient have difficulty concentrating, remembering, or making decisions?: Yes  Permission Sought/Granted Permission sought to share information with : Family Supports, Oceanographer granted to share information with : No (Contact information  on chart)  Share Information with NAME: [name]  Permission granted to share info w AGENCY: [facility]  Permission granted to share info w Relationship: Daughter  Permission granted to share info w Contact Information: [phone]  Emotional Assessment   Attitude/Demeanor/Rapport: Unable to Assess Affect (typically observed): Unable to Assess Orientation: : Oriented to Self, Oriented to Place, Oriented to Situation Alcohol / Substance Use: Not Applicable Psych Involvement: No (comment)  Admission diagnosis:  Bright red blood per rectum [K62.5] Rectal bleeding [K62.5] Acute GI bleeding [K92.2] Patient Active Problem List   Diagnosis Date Noted   Bright red blood per rectum 06/11/2023   Acute blood loss anemia 06/11/2023   Vertebral artery dissection (HCC) 03/31/2022   Overflow incontinence of urine 12/17/2020   Ankle edema, bilateral 12/17/2020   S/P right hip fracture 10/19/2020   Glaucoma 03/19/2020   Dyslipidemia 03/19/2020   Memory loss 03/19/2020   Syncope 05/03/2018   AF (paroxysmal atrial fibrillation) (HCC) 05/03/2018   Lower GI bleed 07/31/2012   DM2 (diabetes mellitus, type 2) (HCC) 07/31/2012   HTN (hypertension) 07/31/2012   PCP:  Nelwyn Salisbury, MD Pharmacy:   [facility] [phone] [place]in[place] Otto, [place] - [address] AT [address] [place] [address] Phone: [phone] Fax: [phone]  [facility] [address] [place] [address] Phone: [phone] Fax: [phone]     Social Drivers of Health (SDOH) Social History: SDOH Screenings  Food Insecurity: No Food Insecurity (06/11/2023)  Housing: Low Risk  (06/11/2023)  Transportation Needs: No Transportation Needs (06/11/2023)  Utilities: Not At Risk (06/11/2023)  Alcohol Screen: Low Risk  (07/05/2020)  Depression (PHQ2-9): Low Risk  (12/02/2021)  Financial Resource Strain: Low Risk  (04/14/2022)   Received from [facility], [facility]  Physical Activity: Sufficiently Active (07/15/2021)  Social Connections: Unknown (06/11/2023)  Stress: No Stress Concern Present (04/14/2022)   Received from [facility], [facility]  Tobacco Use: Low Risk  (06/11/2023)   SDOH Interventions:     Readmission Risk Interventions     No data to display

## 2023-06-11 NOTE — ED Provider Notes (Signed)
 [place] EMERGENCY DEPARTMENT AT [facility] Provider Note   CSN: [id number] Arrival date & time: 06/11/23  0455     History  Chief Complaint  Patient presents with   Rectal Bleeding   Level 5 caveat due to dementia [name] is a 89 y.o. female.  The history is provided by the patient.   Patient with history of diabetes, hypertension, atrial fibrillation on Eliquis presents with GI bleed.  Patient presents from local nursing facility with blood clots in her diaper  Patient denies any complaints and is unaware of any issues.  She denies any abdominal pain.   Past Medical History:  Diagnosis Date   Diabetes mellitus without complication (HCC)    Diverticulitis    Glaucoma    Hypercholesterolemia    Hypertension    PAF (paroxysmal atrial fibrillation) (HCC)    sees Rudi Coco NP    Shingles     Home Medications Prior to Admission medications   Medication Sig Start Date End Date Taking? Authorizing Provider  acetaminophen (TYLENOL) 500 MG tablet Take 500 mg by mouth every 6 (six) hours as needed.     [provider]  brimonidine-timolol (COMBIGAN) 0.2-0.5 % ophthalmic solution Place 1 drop into both eyes every 12 (twelve) hours.    [provider]  dorzolamide (TRUSOPT) 2 % ophthalmic solution 1 drop daily. 06/24/21   [provider]  ELIQUIS 2.5 MG TABS tablet TAKE 1 TABLET BY MOUTH TWICE DAILY 10/15/21   Newman Nip, NP  influenza vaccine adjuvanted (FLUAD QUADRIVALENT) 0.5 ML injection Fluad Quad 2020-2021(34yr up)(PF) 60 mcg (15 mcg x 4)/0.74mL IM syringe  ADM 0.5ML IM UTD    [provider]  LUMIGAN 0.01 % SOLN Place 1 drop into both eyes at bedtime. 11/10/17   [provider]  memantine (NAMENDA) 10 MG tablet Take 1/2 tablet (5 mg at night) for 2 weeks, then increase to 1 tablet (10 mg) at night Patient taking differently: Take 10 mg by mouth in the morning. 03/13/21   Marcos Eke, PA-C  metoprolol  tartrate (LOPRESSOR) 25 MG tablet Take 0.5 tablets (12.5 mg total) by mouth 2 (two) times daily. 06/15/22 09/13/22  Newman Nip, NP  nepafenac (ILEVRO) 0.3 % ophthalmic suspension Place 1 drop into both eyes 2 (two) times daily. 11/20/19   [provider]  oxybutynin (DITROPAN-XL) 10 MG 24 hr tablet TAKE 1 TABLET(10 MG) BY MOUTH AT BEDTIME 04/15/22   Nelwyn Salisbury, MD  rivastigmine (EXELON) 4.6 mg/24hr Place 1 patch (4.6 mg total) onto the skin daily. 01/28/22   Nelwyn Salisbury, MD      Allergies    Penicillins, Aricept [donepezil], and Benadryl [diphenhydramine]    Review of Systems   Review of Systems  Unable to perform ROS: Dementia    Physical Exam Updated Vital Signs BP (!) 148/63   Pulse (!) 48   Temp 98.1 F (36.7 C) (Oral)   Resp 19   Wt 52.2 kg   SpO2 100%   BMI 21.39 kg/m  Physical Exam CONSTITUTIONAL: Elderly, no acute distress and smiling HEAD: Normocephalic/atraumatic EYES: EOMI/PERRL, conjunctiva pale ENMT: Mucous membranes moist NECK: supple no meningeal signs CV: S1/S2 noted LUNGS: Lungs are clear to auscultation bilaterally, no apparent distress ABDOMEN: soft, nontender GU: GU and rectal exam performed with nurse Tobi Bastos present No signs of vaginal bleeding.  Patient has extensive maroon-colored stool in her diaper NEURO: Pt is awake/alert/appropriate, moves all extremitiesx4.  No facial droop.  SKIN: warm, pale PSYCH: Patient is pleasantly demented  ED Results / Procedures / Treatments   Labs (all labs ordered are listed, but only abnormal results are displayed) Labs Reviewed  CBC - Abnormal; Notable for the following components:      Result Value   RBC 3.46 (*)    Hemoglobin 10.5 (*)    HCT 32.2 (*)    All other components within normal limits  BASIC METABOLIC PANEL - Abnormal; Notable for the following components:   Glucose, Bld 169 (*)    All other components within normal limits  PROTIME-INR  TYPE AND SCREEN    EKG EKG  Interpretation Date/Time:  Friday June 11 2023 05:39:38 EST Ventricular Rate:  47 PR Interval:  182 QRS Duration:  101 QT Interval:  459 QTC Calculation: 406 R Axis:   1  Text Interpretation: Sinus bradycardia Atrial premature complex Abnormal R-wave progression, early transition Left ventricular hypertrophy Interpretation limited secondary to artifact Confirmed by Zadie Rhine ([id number]) on 06/11/2023 5:46:22 AM  Radiology No results found.  Procedures Procedures    Medications Ordered in ED Medications - No data to display  ED Course/ Medical Decision Making/ A&P Clinical Course as of 06/11/23 6045  Fri Jun 11, 2023  0520 Patient is a very pleasant 89 year old who presents for rectal bleeding from nursing facility I was unable to contact her get a hold of anybody at the facility.  I was able to speak to her daughter.  She reports the patient's husband passed away last week and the funeral is today.  She was unaware that her mother was in the hospital.  I advised the patient does have signs of active GI bleed in the setting of anticoagulation but is currently hemodynamically appropriate. Patient has no personal religious objections to blood transfusions [DW]  0650 Patient stable in the ER.  Hemoglobin is appropriate.  Will call for admission.  Secure chat has been sent to gastroenterology Patient does not require emergent transfusion at this time. Previous imaging has revealed diverticulosis, which could be the culprit of the bleed.  I updated the daughter via phone. [DW]  0722 D/w dr Caleb Popp for admission [DW]    Clinical Course User Index [DW] Zadie Rhine, MD                                 Medical Decision Making Amount and/or Complexity of Data Reviewed Labs: ordered.  Risk Decision regarding hospitalization.   This patient presents to the ED for concern of rectal bleeding, this involves an extensive number of treatment options, and is a complaint that  carries with it a high risk of complications and morbidity.  The differential diagnosis includes but is not limited to hemorrhoid, anal fissure, diverticulosis, AVM, upper GI bleed  Comorbidities that complicate the patient evaluation: Patient's presentation is complicated by their history of dementia, A-fib  Social Determinants of Health: Patient's  mental confusion and poor mobility   increases the complexity of managing their presentation  Additional history obtained: Additional history obtained from family Records reviewed  outpatient records reviewed  Lab Tests: I Ordered, and personally interpreted labs.  The pertinent results include: Mild anemia  Cardiac Monitoring: The patient was maintained on a cardiac monitor.  I personally viewed and interpreted the cardiac monitor which showed an underlying rhythm of:  sinus rhythm and Atrial Fibrillation patient is having both sinus rhythm and atrial fibrillation on monitor  Critical Interventions:  admission for evaluation and management of GI bleed in the setting DOAC use  Consultations Obtained: I requested consultation with the consultant [facility] , and discussed  findings as well as pertinent plan - they recommend: will see patient  Reevaluation: After the interventions noted above, I reevaluated the patient and found that they have :stayed the same  Complexity of problems addressed: Patient's presentation is most consistent with  acute presentation with potential threat to life or bodily function  Disposition: After consideration of the diagnostic results and the patient's response to treatment,  I feel that the patent would benefit from admission   .           Final Clinical Impression(s) / ED Diagnoses Final diagnoses:  Acute GI bleeding  Rectal bleeding    Rx / DC Orders ED Discharge Orders     None         Zadie Rhine, MD 06/11/23 [phone]

## 2023-06-11 NOTE — Significant Event (Signed)
 Rapid Response Event Note   Reason for Call :  Brief loss of consciousness, full body shaking  Initial Focused Assessment:  Pt lying in bed, alert. No distress. Denies complaint. Skin is warm, moist. Abdomen is round, soft, faint bowel sounds. Lung sounds are clear. Heart rate is irregular.   VS: T 97.75F, BP 149/87, HR 69, RR 16, SpO2 99% on room air CBG: 194  Interventions:  -CBG  Plan of Care:  Transfer for cardiac monitoring  Event Summary:  MD Notified: Dr. Caleb Popp Call Time: 1719 Arrival Time: 1722 End Time: 1825  Jennye Moccasin, RN

## 2023-06-11 NOTE — Progress Notes (Signed)
 Received page of brief mental status change. Per nursing and family, patient was talking to family when she developed change in speech pattern and became unresponsive to voice and tactile stimulation. Eyes remained open without specific deviation and patient developed whole-body shaking/tremor. Family concerned for seizure activity. Nursing evaluated patient at bedside with patient returning to prior level of consciousness; the episode lasted about one minute. Episode was accompanied by maroon colored bowel movement; patient is bowel incontinent at baseline.  BP (!) 153/44 (BP Location: Right Arm)   Pulse 61   Temp 98.1 F (36.7 C) (Oral)   Resp 15   Wt 52.2 kg   SpO2 97%   BMI 21.39 kg/m   General exam: Appears calm and comfortable  Respiratory system: Clear to auscultation. Respiratory effort normal. Cardiovascular system: S1 & S2 heard, RRR. No murmurs, rubs, gallops or clicks. Gastrointestinal system: Abdomen is nondistended, soft and nontender. No organomegaly or masses felt. Normal bowel sounds heard. Central nervous system: Alert and oriented. Cranial nerves intact, 4/5 LUE and 5/5 RUE strength. 3/5 bilateral LE strength with increased tone. Tremor of upper extremities noted, worse with movement. Musculoskeletal: No edema. No calf tenderness Skin: No cyanosis. No rashes Psychiatry: Judgement and insight appear normal. Mood & affect appropriate.   A/P: Loss of consciousness episode, unclear etiology. Differential includes vasovagal, seizure, arrhythmia. Stroke causing mental status change is less likely. EKG obtained and significant for sinus rhythm.  -Transfer to telemetry -CT head, EEG  Daughter, Larita Fife, was updated via telephone and family outside the unit.  CRITICAL CARE Performed by: Jacquelin Hawking   Total critical care time: 40 minutes  Critical care time was exclusive of separately billable procedures and treating other patients.  Critical care was necessary to treat or  prevent imminent or life-threatening deterioration.  Critical care was time spent personally by me on the following activities: development of treatment plan with patient and/or surrogate as well as nursing, discussions with consultants, evaluation of patient's response to treatment, examination of patient, obtaining history from patient or surrogate, ordering and performing treatments and interventions, ordering and review of laboratory studies, ordering and review of radiographic studies, pulse oximetry and re-evaluation of patient's condition.  Jacquelin Hawking, MD [facility] 06/11/2023, 6:52 PM

## 2023-06-11 NOTE — H&P (Addendum)
 History and Physical    Patient: [name] HYQ:[id number] DOB: 12/01/1928 DOA: 06/11/2023 DOS: the patient was seen and examined on 06/11/2023 PCP: Nelwyn Salisbury, MD  Patient coming from: SNF  Chief Complaint:  Chief Complaint  Patient presents with   Rectal Bleeding   HPI: [name] is a 89 y.o. female with medical history significant of diabetes mellitus, glaucoma, diverticulosis, paroxysmal atrial fibrillation, hypertension. Unable to obtain history from patient secondary to underlying dementia. History obtained from EDP discussion. Patient was at her facility and was found to have blood in her adult diaper concerning for GI bleeding. Patient without chest pain, dyspnea, abdominal pain, nausea, vomiting.   Review of Systems: As mentioned in the history of present illness. All other systems reviewed and are negative. Past Medical History:  Diagnosis Date   Diabetes mellitus without complication (HCC)    Diverticulitis    Glaucoma    Hypercholesterolemia    Hypertension    PAF (paroxysmal atrial fibrillation) (HCC)    sees Rudi Coco NP    Shingles    Past Surgical History:  Procedure Laterality Date   ABDOMINAL HYSTERECTOMY     COLONOSCOPY     per Dr. Elnoria Howard    TOTAL HIP ARTHROPLASTY  2005   left sided   Social History:  reports that she has never smoked. She has never used smokeless tobacco. She reports that she does not drink alcohol and does not use drugs.  Allergies  Allergen Reactions   Penicillins Anaphylaxis and Other (See Comments)    Did it involve swelling of the face/tongue/throat, SOB, or low BP? Yes Did it involve sudden or severe rash/hives, skin peeling, or any reaction on the inside of your mouth or nose? Yes Did you need to seek medical attention at a hospital or doctor's office? Yes When did it last happen? Unknown If all above answers are "NO", may proceed with cephalosporin use.  Did it involve swelling of the face/tongue/throat, SOB, or  low BP? Yes Did it involve sudden or severe rash/hives, skin peeling, or any reaction on the inside of your mouth or nose? Yes Did you need to seek medical attention at a hospital or doctor's office? Yes When did it last happen? Unknown If all above answers are "NO", may proceed with cephalosporin use.    Aricept [Donepezil] Diarrhea   Benadryl [Diphenhydramine] Rash    Family History  Problem Relation Age of Onset   CAD Father     Prior to Admission medications   Medication Sig Start Date End Date Taking? Authorizing Provider  acetaminophen (TYLENOL) 500 MG tablet Take 500 mg by mouth every 6 (six) hours as needed.     [provider]  brimonidine-timolol (COMBIGAN) 0.2-0.5 % ophthalmic solution Place 1 drop into both eyes every 12 (twelve) hours.    [provider]  dorzolamide (TRUSOPT) 2 % ophthalmic solution 1 drop daily. 06/24/21   [provider]  ELIQUIS 2.5 MG TABS tablet TAKE 1 TABLET BY MOUTH TWICE DAILY 10/15/21   Newman Nip, NP  influenza vaccine adjuvanted (FLUAD QUADRIVALENT) 0.5 ML injection Fluad Quad 2020-2021(62yr up)(PF) 60 mcg (15 mcg x 4)/0.77mL IM syringe  ADM 0.5ML IM UTD    [provider]  LUMIGAN 0.01 % SOLN Place 1 drop into both eyes at bedtime. 11/10/17   [provider]  memantine (NAMENDA) 10 MG tablet Take 1/2 tablet (5 mg at night) for 2 weeks, then increase to 1 tablet (10 mg) at  night Patient taking differently: Take 10 mg by mouth in the morning. 03/13/21   Marcos Eke, PA-C  metoprolol tartrate (LOPRESSOR) 25 MG tablet Take 0.5 tablets (12.5 mg total) by mouth 2 (two) times daily. 06/15/22 09/13/22  Newman Nip, NP  nepafenac (ILEVRO) 0.3 % ophthalmic suspension Place 1 drop into both eyes 2 (two) times daily. 11/20/19   [provider]  oxybutynin (DITROPAN-XL) 10 MG 24 hr tablet TAKE 1 TABLET(10 MG) BY MOUTH AT BEDTIME 04/15/22   Nelwyn Salisbury, MD  rivastigmine (EXELON) 4.6 mg/24hr Place 1  patch (4.6 mg total) onto the skin daily. 01/28/22   Nelwyn Salisbury, MD    Physical Exam: BP (!) 152/54   Pulse (!) 55   Temp 97.9 F (36.6 C) (Oral)   Resp 16   Wt 52.2 kg   SpO2 100%   BMI 21.39 kg/m   General exam: Appears calm and comfortable Respiratory system: Clear to auscultation. Respiratory effort normal. Cardiovascular system: S1 & S2 heard, RRR. No murmurs. Gastrointestinal system: Abdomen is nondistended, soft and nontender. Normal bowel sounds heard. Central nervous system: Alert and oriented. No focal neurological deficits. Musculoskeletal: No edema. No calf tenderness Psychiatry: Judgement and insight appear normal. Mood & affect appropriate.   Data Reviewed: There are no new results to review at this time.  Assessment and Plan:  GI bleed Patient with red blood per rectum with clots. Complicated by Eliquis use. Patient has a history of diverticulosis. [facility] consulted by EDP and will see. -GI recommendations: conservative management; CTA if significant re-bleed -FOBT  Acute blood loss anemia Baseline hemoglobin of 12 from 2023. Hemoglobin of 10.5 on admission. Anemia secondary to acute GI bleeding. -GI recommendations: serial H&H  Paroxysmal atrial fibrillation Currently in sinus rhythm with bradycardia. Patient is listed as taking Lopressor and Eliquis as an outpatient. -Hold metoprolol secondary to bradycardia -Hold Eliquis secondary to GI bleeding  Dementia Patient is listed as taking Namenda and rivastigmine, however medication reconciliation is pending fax from facility. -Resume outpatient medication once verified  Diabetes mellitus type 2 Controlled based on last hemoglobin A1C of 6.2% from 2023. Patient is not listed as taking outpatient medication for management.  Glaucoma Patient is listed as taking Combigan, nepafenac and Trusopt. -Resume outpatient medication once verified     Advance Care Planning:   Code Status: Do  not attempt resuscitation (DNR) PRE-ARREST INTERVENTIONS DESIRED. Confirmed via DNR form. Daughter deferred to [facility] who could not be contact.  Consults: [facility]  Family Communication: Daughter on telephone   Author: Jacquelin Hawking, MD 06/11/2023 7:24 AM  For on call review [email or web address].

## 2023-06-11 NOTE — Consult Note (Addendum)
 Consultation  Referring Provider: ER MD/Wickline  Primary Care Physician:  Nelwyn Salisbury, MD Primary Gastroenterologist: Gentry Fitz  Reason for Consultation: Acute GI bleed  HPI: [name] is a 89 y.o. female, resident of a nursing home with history of dementia, diabetes mellitus, hypertension, hyperlipidemia, atrial fibrillation for which she is on Eliquis. Patient has previously documented diverticular disease, and history of previous diverticular bleeding. She was brought to the emergency room early this morning after staff at the nursing home noticed maroon stool in her depends.  Patient herself says she feels fine, and is unaware that she has been bleeding.  She has no complaints of nausea or vomiting, no abdominal pain or cramping.  She comments that the emergency room seems to have "a lot going on"..  Last dose of Eliquis presumed last p.m. at nursing home.  She has been hemodynamically stable since arrival. Most recent hemoglobin from 05/07/2023 was 12.9  Labs today show WBC 5.8/hemoglobin 10.5/hematocrit 32.2 Time 15.1/INR 1.2 BUN 14/creatinine 0.68  No Abdominal imaging here this admit  Last CT in our system in 2020 showed colonic diverticulosis  Care everywhere shows admission through [facility] in [place] in August 2021 with a GI bleed felt to be of diverticular etiology.  She did not undergo CTA or endoscopic evaluation during that admission and bleed resolved with conservative measures. Past medical history list prior colonoscopy with Dr. Elnoria Howard at some point but that may have been many years ago, I cannot see a date  Past Medical History:  Diagnosis Date   Diabetes mellitus without complication (HCC)    Diverticulitis    Glaucoma    Hypercholesterolemia    Hypertension    PAF (paroxysmal atrial fibrillation) (HCC)    sees Rudi Coco NP    Shingles     Past Surgical History:  Procedure Laterality Date   ABDOMINAL HYSTERECTOMY     COLONOSCOPY      per Dr. Elnoria Howard    TOTAL HIP ARTHROPLASTY  2005   left sided    Prior to Admission medications   Medication Sig Start Date End Date Taking? Authorizing Provider  acetaminophen (TYLENOL) 500 MG tablet Take 500 mg by mouth every 6 (six) hours as needed.     [provider]  brimonidine-timolol (COMBIGAN) 0.2-0.5 % ophthalmic solution Place 1 drop into both eyes every 12 (twelve) hours.    [provider]  dorzolamide (TRUSOPT) 2 % ophthalmic solution 1 drop daily. 06/24/21   [provider]  ELIQUIS 2.5 MG TABS tablet TAKE 1 TABLET BY MOUTH TWICE DAILY 10/15/21   Newman Nip, NP  LUMIGAN 0.01 % SOLN Place 1 drop into both eyes at bedtime. 11/10/17   [provider]  memantine (NAMENDA) 10 MG tablet Take 1/2 tablet (5 mg at night) for 2 weeks, then increase to 1 tablet (10 mg) at night Patient taking differently: Take 10 mg by mouth in the morning. 03/13/21   Marcos Eke, PA-C  metoprolol tartrate (LOPRESSOR) 25 MG tablet Take 0.5 tablets (12.5 mg total) by mouth 2 (two) times daily. 06/15/22 09/13/22  Newman Nip, NP  nepafenac (ILEVRO) 0.3 % ophthalmic suspension Place 1 drop into both eyes 2 (two) times daily. 11/20/19   [provider]  oxybutynin (DITROPAN-XL) 10 MG 24 hr tablet TAKE 1 TABLET(10 MG) BY MOUTH AT BEDTIME 04/15/22   Nelwyn Salisbury, MD  rivastigmine (EXELON) 4.6 mg/24hr Place 1 patch (4.6 mg total) onto the skin daily. 01/28/22  Nelwyn Salisbury, MD    Current Facility-Administered Medications  Medication Dose Route Frequency Provider Last Rate Last Admin   0.9 %  sodium chloride infusion (Manually program via Guardrails IV Fluids)   Intravenous Once Esterwood, Amy S, PA-C       Current Outpatient Medications  Medication Sig Dispense Refill   acetaminophen (TYLENOL) 500 MG tablet Take 500 mg by mouth every 6 (six) hours as needed.      brimonidine-timolol (COMBIGAN) 0.2-0.5 % ophthalmic solution Place 1 drop into both eyes every 12  (twelve) hours.     dorzolamide (TRUSOPT) 2 % ophthalmic solution 1 drop daily.     ELIQUIS 2.5 MG TABS tablet TAKE 1 TABLET BY MOUTH TWICE DAILY 60 tablet 11   LUMIGAN 0.01 % SOLN Place 1 drop into both eyes at bedtime.  5   memantine (NAMENDA) 10 MG tablet Take 1/2 tablet (5 mg at night) for 2 weeks, then increase to 1 tablet (10 mg) at night (Patient taking differently: Take 10 mg by mouth in the morning.) 30 tablet 11   metoprolol tartrate (LOPRESSOR) 25 MG tablet Take 0.5 tablets (12.5 mg total) by mouth 2 (two) times daily. 30 tablet 0   nepafenac (ILEVRO) 0.3 % ophthalmic suspension Place 1 drop into both eyes 2 (two) times daily.     oxybutynin (DITROPAN-XL) 10 MG 24 hr tablet TAKE 1 TABLET(10 MG) BY MOUTH AT BEDTIME 90 tablet 0   rivastigmine (EXELON) 4.6 mg/24hr Place 1 patch (4.6 mg total) onto the skin daily. 30 patch 5    Allergies as of 06/11/2023 - Review Complete 06/11/2023  Allergen Reaction Noted   Penicillins Anaphylaxis and Other (See Comments) 06/24/2012   Aricept [donepezil] Diarrhea 03/31/2022   Benadryl [diphenhydramine] Rash 07/23/2020    Family History  Problem Relation Age of Onset   CAD Father     Social History   Socioeconomic History   Marital status: Married    Spouse name: Not on file   Number of children: 2   Years of education: 12   Highest education level: Not on file  Occupational History   Not on file  Tobacco Use   Smoking status: Never   Smokeless tobacco: Never  Vaping Use   Vaping status: Never Used  Substance and Sexual Activity   Alcohol use: Never    Comment: occassionally   Drug use: Never   Sexual activity: Not Currently  Other Topics Concern   Not on file  Social History Narrative   ** Merged History Encounter **       Right handed Drinks caffeine  One story home   Social Drivers of Health   Financial Resource Strain: Low Risk  (04/14/2022)   Received from [facility], [facility]   Overall Financial Resource  Strain (CARDIA)    Difficulty of Paying Living Expenses: Not very hard  Food Insecurity: No Food Insecurity (12/02/2021)   Hunger Vital Sign    Worried About Running Out of Food in the Last Year: Never true    Ran Out of Food in the Last Year: Never true  Transportation Needs: No Transportation Needs (04/10/2022)   Received from [facility], [facility]   PRAPARE - Transportation    Lack of Transportation (Medical): No    Lack of Transportation (Non-Medical): No  Physical Activity: Sufficiently Active (07/15/2021)   Exercise Vital Sign    Days of Exercise per Week: 2 days    Minutes of Exercise per Session: 120 min  Stress: No Stress Concern Present (04/14/2022)   Received from [facility], [facility] - Occupational Stress Questionnaire    Feeling of Stress : Not at all  Social Connections: Unknown (08/12/2021)   Received from [facility], [facility]   Social Network    Social Network: Not on file  Intimate Partner Violence: Not At Risk (12/02/2021)   Humiliation, Afraid, Rape, and Kick questionnaire    Fear of Current or Ex-Partner: No    Emotionally Abused: No    Physically Abused: No    Sexually Abused: No    Review of Systems: Pertinent positive and negative review of systems were noted in the above HPI section.  All other review of systems was otherwise negative.   Physical Exam: Vital signs in last 24 hours: Temp:  [97.9 F (36.6 C)-98.1 F (36.7 C)] 97.9 F (36.6 C) (02/28 0730) Pulse Rate:  [40-59] 54 (02/28 0730) Resp:  [16-19] 18 (02/28 0730) BP: (136-172)/(47-70) 136/49 (02/28 0700) SpO2:  [99 %-100 %] 100 % (02/28 0730) Weight:  [52.2 kg] 52.2 kg (02/28 0510)   General:   Alert,  Well-developed, well-nourished, very elderly African-American female pleasant and cooperative in NAD Head:  Normocephalic and atraumatic. Eyes:  Sclera clear, no icterus.   Conjunctiva pink. Ears:  Normal auditory acuity. Nose:  No  deformity, discharge,  or lesions. Mouth:  No deformity or lesions.   Neck:  Supple; no masses or thyromegaly. Lungs:  Clear throughout to auscultation.   No wheezes, crackles, or rhonchi.  Heart:  Regular rate and rhythm; no murmurs, clicks, rubs,  or gallops. Abdomen:  Soft,nontender, BS active,nonpalp mass or hsm.   Rectal: Not done, documented maroon stool in diaper Msk:  Symmetrical without gross deformities. . Pulses:  Normal pulses noted. Extremities:  Without clubbing or edema. Neurologic:  Alert and  oriented x2  grossly normal neurologically. Skin:  Intact without significant lesions or rashes.. Psych:  Alert and cooperative. Normal mood and affect.  Intake/Output from previous day: No intake/output data recorded. Intake/Output this shift: No intake/output data recorded.  Lab Results: Recent Labs    06/11/23 0623  WBC 5.8  HGB 10.5*  HCT 32.2*  PLT 162   BMET Recent Labs    06/11/23 0623  NA 140  K 3.8  CL 105  CO2 24  GLUCOSE 169*  BUN 14  CREATININE 0.68  CALCIUM 9.1   LFT No results for input(s): "PROT", "ALBUMIN", "AST", "ALT", "ALKPHOS", "BILITOT", "BILIDIR", "IBILI" in the last 72 hours. PT/INR Recent Labs    06/11/23 0623  LABPROT 15.1  INR 1.2   Hepatitis Panel No results for input(s): "HEPBSAG", "HCVAB", "HEPAIGM", "HEPBIGM" in the last 72 hours.   IMPRESSION:  #57 89 year old African-American female, nursing home resident at nursing facility with history of dementia, brought to the emergency room after staff noticed maroon stool in her depends this morning. Patient has been hemodynamically stable, and has no complaints of abdominal pain or discomfort, no cramping, no nausea or vomiting. she is on Eliquis. Hemoglobin 10.5 down from her baseline of 12.9 in January BUN normal  I suspect she is having a recurrent diverticular hemorrhage as estimated by Eliquis.  2 anemia acute secondary to acute GI blood loss 3 chronic  anticoagulation-Eliquis 4.  Atrial fibrillation 5.  Dementia 6.  Diabetes mellitus 7.  History of hypertension  Plan; clear liquid diet Serial hemoglobins every 6 hours and transfuse as needed for hemoglobin 8 or less  given her advanced age Hold Eliquis Will not pursue CTA at present but should she have further significant bleeding today or any change in hemodynamics then would pursue CTA  Hopefully can manage conservatively. Per chart it looks as if ER doctor was able to speak with patient's daughter who related that the patient's husband's funeral today.  GI will follow with you    Amy Esterwood PA-C 06/11/2023, 9:25 AM    I have taken an interval history, thoroughly reviewed the chart and examined the patient. I agree with the Advanced Practitioner's note, impression and recommendations, and have recorded additional findings, impressions and recommendations below. I performed a substantive portion of this encounter (>50% time spent), including a complete performance of the medical decision making.  My additional thoughts are as follows:  Hematochezia blood loss anemia, history of diverticular bleeding at outside hospital in 2021.  Hemodynamically stable, BUN normal, probably lower GI bleed at this time and likely diverticular as noted above.   Recommendations As outlined above Bleeding appears to have significantly slowed down or perhaps stopped since arrival in ED, hemoglobin stable on second check.  Therefore CTA yield likely low.  If recurrence of large-volume bleeding,, especially with tachycardia, obtain stat abdominal pelvic CT angiogram  GI service will follow.  Charlie Pitter III Office:[phone]

## 2023-06-11 NOTE — ED Triage Notes (Signed)
 Pt in from [facility] via [facility] with rectal bleed. Per EMS, pt has had 4 large clots pass since 0100, takes Eliquis. Arrives pleasant but oriented to self only, dementia baseline

## 2023-06-11 NOTE — Progress Notes (Signed)
 Notified by family that help was needed in patients room. On arrival patient was unresponsive while eyes remained open. Patient began shaking with agonal respirations. Rapid response was called and responded promptly. Vitals and cbg wnl. Episode lasted around one minute followed by large BM. Patient became responsive and had no recollection of what had occurred. Provider notified by rapid response nurse.

## 2023-06-12 ENCOUNTER — Observation Stay (HOSPITAL_COMMUNITY)

## 2023-06-12 DIAGNOSIS — Z634 Disappearance and death of family member: Secondary | ICD-10-CM | POA: Diagnosis not present

## 2023-06-12 DIAGNOSIS — F0390 Unspecified dementia without behavioral disturbance: Secondary | ICD-10-CM | POA: Diagnosis present

## 2023-06-12 DIAGNOSIS — I480 Paroxysmal atrial fibrillation: Secondary | ICD-10-CM | POA: Diagnosis present

## 2023-06-12 DIAGNOSIS — E119 Type 2 diabetes mellitus without complications: Secondary | ICD-10-CM | POA: Diagnosis present

## 2023-06-12 DIAGNOSIS — Z79899 Other long term (current) drug therapy: Secondary | ICD-10-CM | POA: Diagnosis not present

## 2023-06-12 DIAGNOSIS — E7800 Pure hypercholesterolemia, unspecified: Secondary | ICD-10-CM | POA: Diagnosis present

## 2023-06-12 DIAGNOSIS — Z96642 Presence of left artificial hip joint: Secondary | ICD-10-CM | POA: Diagnosis present

## 2023-06-12 DIAGNOSIS — D62 Acute posthemorrhagic anemia: Secondary | ICD-10-CM | POA: Diagnosis present

## 2023-06-12 DIAGNOSIS — Z8249 Family history of ischemic heart disease and other diseases of the circulatory system: Secondary | ICD-10-CM | POA: Diagnosis not present

## 2023-06-12 DIAGNOSIS — R569 Unspecified convulsions: Secondary | ICD-10-CM | POA: Diagnosis not present

## 2023-06-12 DIAGNOSIS — Z7901 Long term (current) use of anticoagulants: Secondary | ICD-10-CM | POA: Diagnosis not present

## 2023-06-12 DIAGNOSIS — K5731 Diverticulosis of large intestine without perforation or abscess with bleeding: Secondary | ICD-10-CM | POA: Diagnosis present

## 2023-06-12 DIAGNOSIS — R001 Bradycardia, unspecified: Secondary | ICD-10-CM | POA: Diagnosis present

## 2023-06-12 DIAGNOSIS — I10 Essential (primary) hypertension: Secondary | ICD-10-CM | POA: Diagnosis present

## 2023-06-12 DIAGNOSIS — R55 Syncope and collapse: Secondary | ICD-10-CM | POA: Diagnosis not present

## 2023-06-12 DIAGNOSIS — Z66 Do not resuscitate: Secondary | ICD-10-CM | POA: Diagnosis present

## 2023-06-12 DIAGNOSIS — T45515A Adverse effect of anticoagulants, initial encounter: Secondary | ICD-10-CM | POA: Diagnosis present

## 2023-06-12 DIAGNOSIS — R251 Tremor, unspecified: Secondary | ICD-10-CM | POA: Diagnosis present

## 2023-06-12 DIAGNOSIS — Z8619 Personal history of other infectious and parasitic diseases: Secondary | ICD-10-CM | POA: Diagnosis not present

## 2023-06-12 DIAGNOSIS — H409 Unspecified glaucoma: Secondary | ICD-10-CM | POA: Diagnosis present

## 2023-06-12 DIAGNOSIS — R159 Full incontinence of feces: Secondary | ICD-10-CM | POA: Diagnosis present

## 2023-06-12 DIAGNOSIS — Z880 Allergy status to penicillin: Secondary | ICD-10-CM | POA: Diagnosis not present

## 2023-06-12 DIAGNOSIS — D6832 Hemorrhagic disorder due to extrinsic circulating anticoagulants: Secondary | ICD-10-CM | POA: Diagnosis present

## 2023-06-12 DIAGNOSIS — Z90710 Acquired absence of both cervix and uterus: Secondary | ICD-10-CM | POA: Diagnosis not present

## 2023-06-12 DIAGNOSIS — I4892 Unspecified atrial flutter: Secondary | ICD-10-CM | POA: Diagnosis present

## 2023-06-12 DIAGNOSIS — K625 Hemorrhage of anus and rectum: Secondary | ICD-10-CM | POA: Diagnosis present

## 2023-06-12 LAB — ECHOCARDIOGRAM COMPLETE
Area-P 1/2: 2.29 cm2
S' Lateral: 2.1 cm
Weight: 1841.28 [oz_av]

## 2023-06-12 LAB — HEMOGLOBIN AND HEMATOCRIT, BLOOD
HCT: 25.9 % — ABNORMAL LOW (ref 36.0–46.0)
HCT: 24.4 % — ABNORMAL LOW (ref 36.0–46.0)
HCT: 24.7 % — ABNORMAL LOW (ref 36.0–46.0)
Hemoglobin: 8.4 g/dL — ABNORMAL LOW (ref 12.0–15.0)
Hemoglobin: 8.3 g/dL — ABNORMAL LOW (ref 12.0–15.0)
Hemoglobin: 8.4 g/dL — ABNORMAL LOW (ref 12.0–15.0)

## 2023-06-12 LAB — MRSA NEXT GEN BY PCR, NASAL: MRSA by PCR Next Gen: NOT DETECTED

## 2023-06-12 MED ORDER — VITAMIN B-12 1000 MCG PO TABS
1000.0000 ug | ORAL_TABLET | Freq: Every day | ORAL | Status: DC
  Administered 2023-06-12 – 2023-06-14 (×3): 1000 ug via ORAL
  Filled 2023-06-12 (×3): qty 1

## 2023-06-12 MED ORDER — RIVASTIGMINE 4.6 MG/24HR TD PT24
4.6000 mg | MEDICATED_PATCH | Freq: Every day | TRANSDERMAL | Status: DC
  Administered 2023-06-12 – 2023-06-15 (×4): 4.6 mg via TRANSDERMAL
  Filled 2023-06-12 (×4): qty 1

## 2023-06-12 MED ORDER — MEMANTINE HCL 5 MG PO TABS
10.0000 mg | ORAL_TABLET | Freq: Every day | ORAL | Status: DC
  Administered 2023-06-12 – 2023-06-14 (×3): 10 mg via ORAL
  Filled 2023-06-12: qty 2
  Filled 2023-06-12 (×3): qty 1
  Filled 2023-06-12: qty 2

## 2023-06-12 MED ORDER — OXYBUTYNIN CHLORIDE 5 MG PO TABS
5.0000 mg | ORAL_TABLET | Freq: Every day | ORAL | Status: DC
Start: 2023-06-12 — End: 2023-06-15
  Administered 2023-06-12 – 2023-06-14 (×3): 5 mg via ORAL
  Filled 2023-06-12 (×4): qty 1

## 2023-06-12 NOTE — Progress Notes (Signed)
  Echocardiogram 2D Echocardiogram has been performed.  Leda Roys RDCS 06/12/2023, 3:34 PM

## 2023-06-12 NOTE — Progress Notes (Signed)
 PROGRESS NOTE    [name]  ZOX:[id number] DOB: 15-Mar-1929 DOA: 06/11/2023 PCP: Nelwyn Salisbury, MD   Brief Narrative: [name] is a 89 y.o. female with a history of diabetes mellitus type 2, glaucoma, paroxysmal atrial fibrillation/flutter, hypertension, diverticulosis.  Patient presented secondary to rectal bleeding with concern for lower GI bleed. GI consulted with recommendation for conservative management. Hospitalization complicated by an episode of transient loss of consciousness concerning for vasovagal event. CT head negative. Trending hemoglobin.   Assessment and Plan:  GI bleed Patient with red blood per rectum with clots. Complicated by Eliquis use. Patient has a history of diverticulosis. [facility] consulted by EDP and will see. -GI recommendations: conservative management; CTA if significant re-bleed with hemodynamic instability -FOBT pending   Acute blood loss anemia Baseline hemoglobin of 12 from 2023. Hemoglobin of 10.5 on admission. Anemia secondary to acute GI bleeding. Hemoglobin with downtrend to 8.4 today -GI recommendations: serial H&H   Paroxysmal atrial fibrillation Currently in sinus rhythm with bradycardia. Patient is listed as taking Lopressor and Eliquis as an outpatient. -Hold metoprolol secondary to bradycardia -Hold Eliquis secondary to GI bleeding   Dementia Patient is listed as taking Namenda and rivastigmine, however medication reconciliation is pending fax from facility. -Resume Namenda and rivastigmine   Diabetes mellitus type 2 Controlled based on last hemoglobin A1C of 6.2% from 2023. Patient is not listed as taking outpatient medication for management.   Glaucoma Patient is no longer on eye drop medication.  Syncope Unclear etiology. Seems to likely be secondary to vasovagal episode. CT head negative. EEG ordered and pending. Transthoracic Echocardiogram ordered and pending. Patient placed on telemetry.   DVT  prophylaxis: SCDs Code Status:   Code Status: Do not attempt resuscitation (DNR) PRE-ARREST INTERVENTIONS DESIRED Family Communication: None at bedside. Called daughter on telephone; no response Disposition Plan: Discharge pending stable hemoglobin   Consultants:  [facility]  Procedures:  None  Antimicrobials: None    Subjective: Patient reports no concerns this morning.  Objective: BP (!) 86/57 (BP Location: Right Arm)   Pulse 68   Temp 97.7 F (36.5 C) (Axillary)   Resp 15   Wt 52.2 kg   SpO2 98%   BMI 21.39 kg/m   Examination:  General exam: Appears calm and comfortable Respiratory system: Clear to auscultation. Respiratory effort normal. Cardiovascular system: S1 & S2 heard, RRR. Gastrointestinal system: Abdomen is nondistended, soft and nontender. No organomegaly or masses felt. Normal bowel sounds heard. Central nervous system: Asleep but arouses easily. Musculoskeletal: No edema. No calf tenderness Skin: No cyanosis. No rashes   Data Reviewed: I have personally reviewed following labs and imaging studies  CBC Lab Results  Component Value Date   WBC 5.8 06/11/2023   RBC 3.46 (L) 06/11/2023   HGB 8.4 (L) 06/12/2023   HCT 25.9 (L) 06/12/2023   MCV 93.1 06/11/2023   MCH 30.3 06/11/2023   PLT 162 06/11/2023   MCHC 32.6 06/11/2023   RDW 13.3 06/11/2023   LYMPHSABS 2.0 01/28/2022   MONOABS 0.4 01/28/2022   EOSABS 0.1 01/28/2022   BASOSABS 0.0 01/28/2022     Last metabolic panel Lab Results  Component Value Date   NA 140 06/11/2023   K 3.8 06/11/2023   CL 105 06/11/2023   CO2 24 06/11/2023   BUN 14 06/11/2023   CREATININE 0.68 06/11/2023   GLUCOSE 169 (H) 06/11/2023   GFRNONAA >60 06/11/2023   GFRAA 48 (L) 01/05/2020   CALCIUM 9.1 06/11/2023  PHOS 4.0 10/20/2020   PROT 6.5 01/28/2022   ALBUMIN 3.8 01/28/2022   BILITOT 1.2 01/28/2022   ALKPHOS 95 01/28/2022   AST 11 01/28/2022   ALT 8 01/28/2022   ANIONGAP 11 06/11/2023    GFR: CrCl  cannot be calculated (Unknown ideal weight.).  Recent Results (from the past 240 hours)  MRSA Next Gen by PCR, Nasal     Status: None   Collection Time: 06/11/23  8:34 PM   Specimen: Nasal Mucosa; Nasal Swab  Result Value Ref Range Status   MRSA by PCR Next Gen NOT DETECTED NOT DETECTED Final    Comment: (NOTE) The GeneXpert MRSA Assay (FDA approved for NASAL specimens only), is one component of a comprehensive MRSA colonization surveillance program. It is not intended to diagnose MRSA infection nor to guide or monitor treatment for MRSA infections. Test performance is not FDA approved in patients less than 43 years old. Performed at [facility], [address] [place], [place] [address]       Radiology Studies: CT HEAD WO CONTRAST ( ) Result Date: 06/12/2023 CLINICAL DATA:  Initial evaluation for mental status change, unknown cause. EXAM: CT HEAD WITHOUT CONTRAST TECHNIQUE: Contiguous axial images were obtained from the base of the skull through the vertex without intravenous contrast. RADIATION DOSE REDUCTION: This exam was performed according to the departmental dose-optimization program which includes automated exposure control, adjustment of the mA and/or kV according to patient size and/or use of iterative reconstruction technique. COMPARISON:  Prior CT from 03/10/2021 FINDINGS: Brain: Examination degraded by motion artifact and positioning. Generalized age-related cerebral atrophy. Patchy hypodensity involving the supratentorial cerebral white matter, most characteristic of chronic microvascular ischemic disease, moderate in nature. 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 No acute intracranial hemorrhage. No acute large vessel territory infarct. No mass lesion or midline shift. No hydrocephalus or extra-axial fluid collection. Vascular: No abnormal hyperdense vessel. Extensive calcified atherosclerosis present at the skull base. Skull: Scalp soft tissues demonstrate no  acute finding. Calvarium intact. Sinuses/Orbits: Globes and orbital soft tissues within normal limits. Visualized paranasal sinuses are clear. No significant mastoid effusion. Other: None. IMPRESSION: 1. No acute intracranial abnormality. 2. Generalized age-related cerebral atrophy with moderate chronic microvascular ischemic disease. Small remote left cerebellar infarct. Electronically Signed   By: Rise Mu M.D.   On: 06/12/2023 01:17      LOS: 0 days    Jacquelin Hawking, MD [facility] 06/12/2023, 10:48 AM   If 7PM-7AM, please contact night-coverage [email or web address]

## 2023-06-12 NOTE — Progress Notes (Addendum)
 EEG complete - results pending

## 2023-06-12 NOTE — Evaluation (Signed)
 Physical Therapy Evaluation Patient Details Name: [name] MRN: [id number] DOB: 11/15/1928 Today's Date: 06/12/2023  History of Present Illness  Pt is a 89 y.o. female presenting 06/11/23 from SNF with blood found in her adult diaper. Pt had significant event 2/28 resulting in a brief loss of consciousness and full body shaking. Head CT showed no acute abnormalities. PMH dementia, DM2, glaucoma, diverticulosis, paroxysmal atrial fibrillation, HTN  Clinical Impression  Pt is a 89 y.o. female presenting on 06/11/23 with above conditions and deficits below, see PT Problem List. PTA pt resided at a long-term care facility. During the session, the pt was difficult to arouse, requiring multiple stimuli and movement to maintain arousal and actively resisted all therapist provided movement. Currently pt is a total assist for bed mobility, displays deficits in cognition, safety/judgement, problem solving, initiation of movement, memory, attention, LE strength & AROM, balance, and functional mobility and would benefit from continued acute PT to address these impairments. Given the pt's current status she would benefit from returning to her long-term care facility upon d/c. Will continue to follow acutely.         If plan is discharge home, recommend the following: Two people to help with walking and/or transfers;Two people to help with bathing/dressing/bathroom;Assistance with cooking/housework;Assistance with feeding;Direct supervision/assist for medications management;Direct supervision/assist for financial management;Assist for transportation;Help with stairs or ramp for entrance;Supervision due to cognitive status   Can travel by private vehicle   No    Equipment Recommendations None recommended by PT (unable to verify equipment pt has available)  Recommendations for Other Services       Functional Status Assessment Patient has had a recent decline in their functional status and/or demonstrates  limited ability to make significant improvements in function in a reasonable and predictable amount of time     Precautions / Restrictions Precautions Precautions: Fall Recall of Precautions/Restrictions: Impaired Restrictions Weight Bearing Restrictions Per Provider Order: No      Mobility  Bed Mobility Overal bed mobility: Needs Assistance Bed Mobility: Supine to Sit, Sit to Supine     Supine to sit: Total assist Sit to supine: Total assist   General bed mobility comments: Pt actively resisted mobilization to EOB from chair position and required total assist.    Transfers                   General transfer comment: Not assessed this session d/t poor command following by pt    Ambulation/Gait               General Gait Details: Not assessed this session d/t poor command following by pt  Stairs            Wheelchair Mobility     Tilt Bed    Modified Rankin (Stroke Patients Only)       Balance Overall balance assessment: Needs assistance Sitting-balance support: No upper extremity supported, Feet supported Sitting balance-Leahy Scale: Fair Sitting balance - Comments: Pt able to sit EOB with both UEs in lap with posterior lean. Pt able to maintain general neutral sitting posture with graded therapist provided anterior, lateral, and posterior overpressure. No LOB Postural control: Posterior lean     Standing balance comment: Not assessed this session                             Pertinent Vitals/Pain Pain Assessment Pain Assessment: Faces Faces Pain Scale: Hurts little more Pain Location: pt  unable to verbalize location Pain Descriptors / Indicators: Grimacing, Guarding Pain Intervention(s): Monitored during session    Home Living Family/patient expects to be discharged to:: Skilled nursing facility                   Additional Comments: Pt was unable to provide additional information regarding home living situation     Prior Function Prior Level of Function : Patient poor historian/Family not available             Mobility Comments: pt unable to provide PLOF hx ADLs Comments: pt unable to provide PLOF hx     Extremity/Trunk Assessment   Upper Extremity Assessment Upper Extremity Assessment: Generalized weakness RUE Deficits / Details: R hand displayed pill-rolling like tremor once pt was awake. RUE Coordination: decreased gross motor LUE Deficits / Details: L hand displayed resting tremor LUE Coordination: decreased gross motor    Lower Extremity Assessment Lower Extremity Assessment: Generalized weakness    Cervical / Trunk Assessment Cervical / Trunk Assessment: Kyphotic  Communication   Communication Communication: Impaired Factors Affecting Communication: Hearing impaired    Cognition Arousal: Lethargic, Alert Behavior During Therapy: Flat affect   PT - Cognitive impairments: History of cognitive impairments                       PT - Cognition Comments: Pt began session lethargic and progressed to alert but was unable to provide more than a few word responses. Often responded with "yeah" with no follow up on commands Following commands: Impaired Following commands impaired: Follows one step commands inconsistently, Follows one step commands with increased time     Cueing Cueing Techniques: Verbal cues, Gestural cues     General Comments General comments (skin integrity, edema, etc.): VSS throughout session on RA.    Exercises General Exercises - Lower Extremity Long Arc Quad: Both, 10 reps, Seated Other Exercises Other Exercises: Pt actively resisted bil LE PROM   Assessment/Plan    PT Assessment Patient needs continued PT services  PT Problem List Decreased strength;Decreased range of motion;Decreased activity tolerance;Decreased balance;Decreased mobility;Decreased coordination;Decreased cognition;Decreased knowledge of use of DME;Decreased safety  awareness;Decreased knowledge of precautions;Cardiopulmonary status limiting activity       PT Treatment Interventions DME instruction;Gait training;Stair training;Functional mobility training;Therapeutic activities;Therapeutic exercise;Balance training;Neuromuscular re-education;Cognitive remediation;Patient/family education;Wheelchair mobility training    PT Goals (Current goals can be found in the Care Plan section)  Acute Rehab PT Goals Patient Stated Goal: pt didn't state PT Goal Formulation: Patient unable to participate in goal setting Time For Goal Achievement: 06/26/23 Potential to Achieve Goals: Fair    Frequency Min 1X/week     Co-evaluation               AM-PAC PT "6 Clicks" Mobility  Outcome Measure Help needed turning from your back to your side while in a flat bed without using bedrails?: Total Help needed moving from lying on your back to sitting on the side of a flat bed without using bedrails?: Total Help needed moving to and from a bed to a chair (including a wheelchair)?: Total Help needed standing up from a chair using your arms (e.g., wheelchair or bedside chair)?: Total Help needed to walk in hospital room?: Total Help needed climbing 3-5 steps with a railing? : Total 6 Click Score: 6    End of Session   Activity Tolerance: Other (comment) (limited by pt's cognition) Patient left: in bed;with call bell/phone within reach;with bed alarm set  PT Visit Diagnosis: Muscle weakness (generalized) (M62.81);Difficulty in walking, not elsewhere classified (R26.2);Unsteadiness on feet (R26.81);Other abnormalities of gait and mobility (R26.89)    Time: 1324-4010 PT Time Calculation (min) (ACUTE ONLY): 33 min   Charges:   PT Evaluation $PT Eval Moderate Complexity: 1 Mod PT Treatments $Therapeutic Activity: 8-22 mins PT General Charges $$ ACUTE PT VISIT: 1 Visit         [address], SPT   [place] 06/12/2023, 1:53 PM

## 2023-06-12 NOTE — Progress Notes (Signed)
 [facility] ROUNDING NOTE   Subjective: No BM since this morning.  Patient denies any abdominal pain   Objective: Vital signs in last 24 hours: Temp:  [97.6 F (36.4 C)-98.1 F (36.7 C)] 97.6 F (36.4 C) (03/01 1100) Pulse Rate:  [61-70] 64 (03/01 1100) Resp:  [15-21] 19 (03/01 1100) BP: (86-173)/(44-87) 95/44 (03/01 1100) SpO2:  [94 %-100 %] 95 % (03/01 1100) Last BM Date : 06/11/23 General: NAD Abdomen: Soft, nontender, no distention, bowel sounds present     Intake/Output from previous day: No intake/output data recorded. Intake/Output this shift: No intake/output data recorded.   Lab Results: Recent Labs    06/11/23 0623 06/11/23 1036 06/11/23 1733 06/11/23 2112 06/12/23 0746  WBC 5.8  --   --   --   --   HGB 10.5*   < > 10.0* 10.3* 8.4*  PLT 162  --   --   --   --   MCV 93.1  --   --   --   --    < > = values in this interval not displayed.   BMET Recent Labs    06/11/23 0623  NA 140  K 3.8  CL 105  CO2 24  GLUCOSE 169*  BUN 14  CREATININE 0.68  CALCIUM 9.1   LFT No results for input(s): "PROT", "ALBUMIN", "AST", "ALT", "ALKPHOS", "BILITOT", "BILIDIR", "IBILI" in the last 72 hours. PT/INR Recent Labs    06/11/23 0623  INR 1.2      Imaging/Other results: CT HEAD WO CONTRAST ( ) Result Date: 06/12/2023 CLINICAL DATA:  Initial evaluation for mental status change, unknown cause. EXAM: CT HEAD WITHOUT CONTRAST TECHNIQUE: Contiguous axial images were obtained from the base of the skull through the vertex without intravenous contrast. RADIATION DOSE REDUCTION: This exam was performed according to the departmental dose-optimization program which includes automated exposure control, adjustment of the mA and/or kV according to patient size and/or use of iterative reconstruction technique. COMPARISON:  Prior CT from 03/10/2021 FINDINGS: Brain: Examination degraded by motion artifact and positioning. Generalized age-related cerebral  atrophy. Patchy hypodensity involving the supratentorial cerebral white matter, most characteristic of chronic microvascular ischemic disease, moderate in nature. 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 No acute intracranial hemorrhage. No acute large vessel territory infarct. No mass lesion or midline shift. No hydrocephalus or extra-axial fluid collection. Vascular: No abnormal hyperdense vessel. Extensive calcified atherosclerosis present at the skull base. Skull: Scalp soft tissues demonstrate no acute finding. Calvarium intact. Sinuses/Orbits: Globes and orbital soft tissues within normal limits. Visualized paranasal sinuses are clear. No significant mastoid effusion. Other: None. IMPRESSION: 1. No acute intracranial abnormality. 2. Generalized age-related cerebral atrophy with moderate chronic microvascular ischemic disease. Small remote left cerebellar infarct. Electronically Signed   By: Rise Mu M.D.   On: 06/12/2023 01:17      Assessment &Plan  89 year old very pleasant female with dementia, diabetes, A-fib on chronic anticoagulation on Eliquis presented with lower GI bleed, likely etiology diverticular hemorrhage Hemoglobin has slightly down trended but no reported bleeding episode Eliquis on hold, will need discussion with her primary cardiologist to see if patient should altogether discontinue anticoagulation given risk for recurrent GI bleed Continue supportive care No plan for any endoscopic evaluation Consider CTA if develops an episode of acute GI hemorrhage with hemodynamic instability  GI is available if needed, please call with any questions    K. Scherry Ran , MD [phone]  [facility]

## 2023-06-12 NOTE — Hospital Course (Signed)
 [name] is a 89 y.o. female with a history of diabetes mellitus type 2, glaucoma, paroxysmal atrial fibrillation/flutter, hypertension, diverticulosis.  Patient presented secondary to rectal bleeding with concern for lower GI bleed. GI consulted with recommendation for conservative management. Hospitalization complicated by an episode of transient loss of consciousness concerning for vasovagal event. CT head negative. Trending hemoglobin.

## 2023-06-13 DIAGNOSIS — D62 Acute posthemorrhagic anemia: Secondary | ICD-10-CM | POA: Diagnosis not present

## 2023-06-13 DIAGNOSIS — K922 Gastrointestinal hemorrhage, unspecified: Principal | ICD-10-CM

## 2023-06-13 DIAGNOSIS — K625 Hemorrhage of anus and rectum: Secondary | ICD-10-CM | POA: Diagnosis not present

## 2023-06-13 DIAGNOSIS — R569 Unspecified convulsions: Secondary | ICD-10-CM

## 2023-06-13 DIAGNOSIS — K5731 Diverticulosis of large intestine without perforation or abscess with bleeding: Secondary | ICD-10-CM | POA: Diagnosis not present

## 2023-06-13 LAB — HEMOGLOBIN AND HEMATOCRIT, BLOOD
HCT: 23.4 % — ABNORMAL LOW (ref 36.0–46.0)
HCT: 25 % — ABNORMAL LOW (ref 36.0–46.0)
HCT: 23.1 % — ABNORMAL LOW (ref 36.0–46.0)
Hemoglobin: 7.9 g/dL — ABNORMAL LOW (ref 12.0–15.0)
Hemoglobin: 8.4 g/dL — ABNORMAL LOW (ref 12.0–15.0)
Hemoglobin: 7.7 g/dL — ABNORMAL LOW (ref 12.0–15.0)

## 2023-06-13 NOTE — Procedures (Signed)
 Patient Name: [name]  MRN: [id number]  Epilepsy Attending: Charlsie Quest  Referring Physician/Provider: Narda Bonds, MD  Date: 06/12/2023 Duration: 23.47 mins  Patient history: 89yo F with syncope. EEG to evaluate for seizure  Level of alertness: Awake, asleep  AEDs during EEG study: None  Technical aspects: This EEG study was done with scalp electrodes positioned according to the 10-20 International system of electrode placement. Electrical activity was reviewed with band pass filter of 1-70Hz , sensitivity of 7 uV/mm, display speed of 22mm/sec with a 60Hz  notched filter applied as appropriate. EEG data were recorded continuously and digitally stored.  Video monitoring was available and reviewed as appropriate.  Description: The posterior dominant rhythm consists of 7 Hz activity of moderate voltage (25-35 uV) seen predominantly in posterior head regions, symmetric and reactive to eye opening and eye closing. Sleep was characterized by vertex waves, sleep spindles (12 to 14 Hz), maximal frontocentral region. EEG showed continuous generalized 5 to 7 Hz theta slowing. Hyperventilation and photic stimulation were not performed.     ABNORMALITY - Continuous slow, generalized  IMPRESSION: This study is suggestive of moderate diffuse encephalopathy. No seizures or epileptiform discharges were seen throughout the recording.  Ludella Pranger Annabelle Harman

## 2023-06-13 NOTE — Progress Notes (Addendum)
 Patient ID: [name], female   DOB: 1928/09/02, 89 y.o.   MRN: [id number]    Progress Note   Subjective  Day # 3 CC;rectal bleeding in setting of Eliquis  Eliquis on hold  HGB10.5 on admit>>7.9 this am  Per nursing, no stools through the night or today Patient says she was feels fine, very pleasant no complaints of abdominal pain or discomfort no nausea would like to eat    Objective   Vital signs in last 24 hours: Temp:  [97.6 F (36.4 C)-98.8 F (37.1 C)] 97.8 F (36.6 C) (03/02 0744) Pulse Rate:  [64-77] 66 (03/02 0744) Resp:  [14-19] 14 (03/02 0331) BP: (95-135)/(44-63) 135/53 (03/02 0744) SpO2:  [95 %-98 %] 96 % (03/02 0744) Last BM Date : 06/12/23 General:    very elderly AA female in NAD Heart:  Regular rate and rhythm; no murmurs Lungs: Respirations even and unlabored, lungs CTA bilaterally Abdomen:  Soft, nontender and nondistended. Normal bowel sounds. Extremities:  Without edema. Neurologic:  Alert and oriented,  grossly normal neurologically. Psych:  Cooperative. Normal mood and affect.  Intake/Output from previous day: 03/01 0701 - 03/02 0700 In: 240 [P.O.:240] Out: 200 [Urine:200] Intake/Output this shift: No intake/output data recorded.  Lab Results: Recent Labs    06/11/23 0623 06/11/23 1036 06/12/23 1423 06/12/23 2115 06/13/23 0216  WBC 5.8  --   --   --   --   HGB 10.5*   < > 8.3* 8.4* 7.9*  HCT 32.2*   < > 24.4* 24.7* 23.4*  PLT 162  --   --   --   --    < > = values in this interval not displayed.   BMET Recent Labs    06/11/23 0623  NA 140  K 3.8  CL 105  CO2 24  GLUCOSE 169*  BUN 14  CREATININE 0.68  CALCIUM 9.1   LFT No results for input(s): "PROT", "ALBUMIN", "AST", "ALT", "ALKPHOS", "BILITOT", "BILIDIR", "IBILI" in the last 72 hours. PT/INR Recent Labs    06/11/23 0623  LABPROT 15.1  INR 1.2    Studies/Results: ECHOCARDIOGRAM COMPLETE Result Date: 06/12/2023    ECHOCARDIOGRAM REPORT   Patient Name:    MEMORI SAMMON Date of Exam: 06/12/2023 Medical Rec #:  [id number]      Height:       61.5 in Accession #:    [id number]     Weight:       115.1 lb Date of Birth:  1929-01-31     BSA:          1.502 m Patient Age:    89 years       BP:           99/24 mmHg Patient Gender: F              HR:           71 bpm. Exam Location:  Inpatient Procedure: 2D Echo, Cardiac Doppler and Color Doppler (Both Spectral and Color            Flow Doppler were utilized during procedure). Indications:    Syncope R55  History:        Patient has prior history of Echocardiogram examinations, most                 recent 07/25/2019.  Sonographer:    Harriette Bouillon RDCS Referring Phys: [phone] RALPH A NETTEY IMPRESSIONS  1. Left ventricular ejection fraction, by estimation, is 65  to 70%. The left ventricle has normal function. The left ventricle has no regional wall motion abnormalities. There is mild left ventricular hypertrophy. Left ventricular diastolic parameters are consistent with Grade I diastolic dysfunction (impaired relaxation).  2. Right ventricular systolic function is normal. The right ventricular size is not well visualized.  3. Left atrial size was moderately dilated.  4. The mitral valve was not well visualized. No evidence of mitral valve regurgitation.  5. The aortic valve is tricuspid. Aortic valve regurgitation is mild to moderate. Aortic valve sclerosis/calcification is present, without any evidence of aortic stenosis.  6. The inferior vena cava is normal in size with greater than 50% respiratory variability, suggesting right atrial pressure of 3 mmHg. Conclusion(s)/Recommendation(s): If normal rhythm, consider non cardiac cause for syncope. FINDINGS  Left Ventricle: Left ventricular ejection fraction, by estimation, is 65 to 70%. The left ventricle has normal function. The left ventricle has no regional wall motion abnormalities. Strain imaging was not performed. The left ventricular internal cavity  size was normal in size.  There is mild left ventricular hypertrophy. Left ventricular diastolic parameters are consistent with Grade I diastolic dysfunction (impaired relaxation). Right Ventricle: The right ventricular size is not well visualized. Right ventricular systolic function is normal. Left Atrium: Left atrial size was moderately dilated. Right Atrium: Right atrial size was normal in size. Pericardium: There is no evidence of pericardial effusion. Mitral Valve: The mitral valve was not well visualized. No evidence of mitral valve regurgitation. Tricuspid Valve: Tricuspid valve regurgitation is not demonstrated. Aortic Valve: The aortic valve is tricuspid. Aortic valve regurgitation is mild to moderate. Aortic valve sclerosis/calcification is present, without any evidence of aortic stenosis. Pulmonic Valve: Pulmonic valve regurgitation is not visualized. Aorta: The aortic root and ascending aorta are structurally normal, with no evidence of dilitation. Venous: The inferior vena cava is normal in size with greater than 50% respiratory variability, suggesting right atrial pressure of 3 mmHg. IAS/Shunts: The interatrial septum was not well visualized. Additional Comments: 3D imaging was not performed.  LEFT VENTRICLE PLAX 2D LVIDd:         3.90 cm   Diastology LVIDs:         2.10 cm   LV e' medial:    3.48 cm/s LV PW:         1.10 cm   LV E/e' medial:  22.6 LV IVS:        1.00 cm   LV e' lateral:   7.07 cm/s LVOT diam:     2.00 cm   LV E/e' lateral: 11.1 LV SV:         73 LV SV Index:   49 LVOT Area:     3.14 cm  RIGHT VENTRICLE         IVC TAPSE (M-mode): 2.0 cm  IVC diam: 1.70 cm LEFT ATRIUM           Index LA diam:      4.40 cm 2.93 cm/m LA Vol (A4C): 62.5 ml 41.60 ml/m  AORTIC VALVE LVOT Vmax:   98.20 cm/s LVOT Vmean:  65.300 cm/s LVOT VTI:    0.232 m  AORTA Ao Root diam: 2.80 cm Ao Asc diam:  2.60 cm MITRAL VALVE MV Area (PHT): 2.29 cm    SHUNTS MV Decel Time: 331 msec    Systemic VTI:  0.23 m MV E velocity: 78.60 cm/s   Systemic Diam: 2.00 cm MV A velocity: 95.90 cm/s MV E/A ratio:  0.82 Photographer signed  by Carolan Clines Signature Date/Time: 06/12/2023/3:48:48 PM    Final    CT HEAD WO CONTRAST ( ) Result Date: 06/12/2023 CLINICAL DATA:  Initial evaluation for mental status change, unknown cause. EXAM: CT HEAD WITHOUT CONTRAST TECHNIQUE: Contiguous axial images were obtained from the base of the skull through the vertex without intravenous contrast. RADIATION DOSE REDUCTION: This exam was performed according to the departmental dose-optimization program which includes automated exposure control, adjustment of the mA and/or kV according to patient size and/or use of iterative reconstruction technique. COMPARISON:  Prior CT from 03/10/2021 FINDINGS: Brain: Examination degraded by motion artifact and positioning. Generalized age-related cerebral atrophy. Patchy hypodensity involving the supratentorial cerebral white matter, most characteristic of chronic microvascular ischemic disease, moderate in nature. 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 No acute intracranial hemorrhage. No acute large vessel territory infarct. No mass lesion or midline shift. No hydrocephalus or extra-axial fluid collection. Vascular: No abnormal hyperdense vessel. Extensive calcified atherosclerosis present at the skull base. Skull: Scalp soft tissues demonstrate no acute finding. Calvarium intact. Sinuses/Orbits: Globes and orbital soft tissues within normal limits. Visualized paranasal sinuses are clear. No significant mastoid effusion. Other: None. IMPRESSION: 1. No acute intracranial abnormality. 2. Generalized age-related cerebral atrophy with moderate chronic microvascular ischemic disease. Small remote left cerebellar infarct. Electronically Signed   By: Rise Mu M.D.   On: 06/12/2023 01:17       Assessment / Plan:    #77 89 year old female, nursing home resident at collapse with history of dementia, diabetes  mellitus, atrial fibrillation for which she had been on Eliquis who presented with acute lower GI bleed  Patient had previous GI bleed in 2023 when she was hospitalized at [facility]-most consistent with diverticular hemorrhage at that time  Has previously documented diverticulosis.  No imaging this admission  Picture is most consistent with recurrent diverticular hemorrhage in setting of chronic anticoagulation  No bleeding overnight or this morning, hopefully has resolved  #2 anemia acute secondary to GI blood loss Hemoglobin 7.9 this morning Has not required transfusion would transfuse for hemoglobin 7.5 or less due to advanced age  Plan; advance to soft diet Continue to trend hemoglobin every 12 hours, transfuse for hemoglobin less than 7.5  No plans for endoscopic intervention Once hemoglobin has totally stabilized, then patient can be discharged back to her nursing facility. Would hold Eliquis for 2 weeks then okay to resume if no further bleeding GI will be available if needed  Principal Problem:   Bright red blood per rectum Active Problems:   DM2 (diabetes mellitus, type 2) (HCC)   HTN (hypertension)   AF (paroxysmal atrial fibrillation) (HCC)   Acute blood loss anemia     LOS: 1 day   Amy Esterwood PA-C 06/13/2023, 8:20 AM   Attending physician's note   I have taken a history, reviewed the chart and examined the patient. I performed a substantive portion of this encounter, including complete performance of at least one of the key components, in conjunction with the APP. I agree with the APP's note, impression and recommendations.   No further bleeding, hemoglobin stable in the last 48 hours.  Most likely etiology of GI blood loss is acute diverticular hemorrhage in the setting of chronic anticoagulation Advance diet as tolerated Monitor hemoglobin and transfuse if below 7 Okay to discharge patient back to nursing facility if no further bleeding No plan for endoscopic  intervention at this point  Given risk for recurrent GI bleed, consider discontinuing anticoagulation, to be discussed with  prescribing provider, will need to evaluate thrombotic risk and benefit of ongoing anticoagulation  GI signing off, available if have any questions     K. Scherry Ran , MD [phone]

## 2023-06-13 NOTE — Progress Notes (Signed)
 PROGRESS NOTE    [name]  WNU:[id number] DOB: [birth year] DOA: 06/11/2023 PCP: Hannah Salisbury, MD   Brief Narrative: [name] is a 89 y.o. female with a history of diabetes mellitus type 2, glaucoma, paroxysmal atrial fibrillation/flutter, hypertension, diverticulosis.  Patient presented secondary to rectal bleeding with concern for lower [name] bleed. [name] consulted with recommendation for conservative management. Hospitalization complicated by an episode of transient loss of consciousness concerning for vasovagal event. CT head negative. Trending hemoglobin.   Assessment and Plan:  [name] bleed Patient with red blood per rectum with clots. Complicated by Eliquis use. Patient has a history of diverticulosis. [name] consulted by EDP. No bowel movement overnight. -[name] recommendations: conservative management; CTA if significant re-bleed with hemodynamic instability -FOBT pending   Acute blood loss anemia Baseline hemoglobin of 12 from 2023. Hemoglobin of 10.5 on admission. Anemia secondary to acute [name] bleeding. Hemoglobin with downtrend to 7.9 today -[name] recommendations: serial H&H   Paroxysmal atrial fibrillation Currently in sinus rhythm with bradycardia. Patient is listed as taking Lopressor and Eliquis as an outpatient. -Hold metoprolol secondary to bradycardia -Hold Eliquis secondary to [name] bleeding   Dementia Patient is listed as taking Namenda and rivastigmine, however medication reconciliation is pending fax from facility. -Continue Namenda and rivastigmine   Diabetes mellitus type 2 Controlled based on last hemoglobin A1C of 6.2% from 2023. Patient is not listed as taking outpatient medication for management.   Glaucoma Patient is no longer on eye drop medication.  Resting tremor Patient reports no prior history. Concern this could be related to underlying neurologic disease. Tremor is bilateral. Patient will need neurology follow-up as an  outpatient.  Syncope Unclear etiology. Seems to likely be secondary to vasovagal episode. CT head negative. EEG ordered and pending. Transthoracic Echocardiogram ordered and without etiology for syncope. Patient placed on telemetry. Per chart review, patient has seen neurology in the past for a similar episode, without diagnosis.   DVT prophylaxis: SCDs Code Status:   Code Status: Do not attempt resuscitation (DNR) PRE-ARREST INTERVENTIONS DESIRED Family Communication: None at bedside. [name] on telephone Disposition Plan: Discharge pending stable hemoglobin   Consultants:  [name]  Procedures:  None  Antimicrobials: None    Subjective: No issues this morning. Doing well.  Objective: BP (!) 135/53 (BP Location: Right Arm)   Pulse 66   Temp 97.8 F (36.6 C) (Axillary)   Resp 14   Wt 52.2 kg   SpO2 96%   BMI 21.39 kg/m   Examination:  General exam: Appears calm and comfortable Respiratory system: Clear to auscultation. Respiratory effort normal. Cardiovascular system: S1 & S2 heard, RRR. Gastrointestinal system: Abdomen is nondistended, soft and nontender. No organomegaly or masses felt. Normal bowel sounds heard. Central nervous system: Alert and oriented to person and place. Musculoskeletal: No edema. No calf tenderness   Data Reviewed: I have personally reviewed following labs and imaging studies  CBC Lab Results  Component Value Date   WBC 5.8 06/11/2023   RBC 3.46 (L) 06/11/2023   HGB 7.9 (L) 06/13/2023   HCT 23.4 (L) 06/13/2023   MCV 93.1 06/11/2023   MCH 30.3 06/11/2023   PLT 162 06/11/2023   MCHC 32.6 06/11/2023   RDW 13.3 06/11/2023   LYMPHSABS 2.0 01/28/2022   MONOABS 0.4 01/28/2022   EOSABS 0.1 01/28/2022   BASOSABS 0.0 01/28/2022     Last metabolic panel Lab Results  Component Value Date   NA 140 06/11/2023   K 3.8 06/11/2023  CL 105 06/11/2023   CO2 24 06/11/2023   BUN 14 06/11/2023   CREATININE 0.68 06/11/2023   GLUCOSE 169  (H) 06/11/2023   GFRNONAA >60 06/11/2023   GFRAA 48 (L) 01/05/2020   CALCIUM 9.1 06/11/2023   PHOS 4.0 10/20/2020   PROT 6.5 01/28/2022   ALBUMIN 3.8 01/28/2022   BILITOT 1.2 01/28/2022   ALKPHOS 95 01/28/2022   AST 11 01/28/2022   ALT 8 01/28/2022   ANIONGAP 11 06/11/2023    GFR: CrCl cannot be calculated (Unknown ideal weight.).  Recent Results (from the past 240 hours)  MRSA Next Gen by PCR, Nasal     Status: None   Collection Time: 06/11/23  8:34 PM   Specimen: Nasal Mucosa; Nasal Swab  Result Value Ref Range Status   MRSA by PCR Next Gen NOT DETECTED NOT DETECTED Final    Comment: (NOTE) The GeneXpert MRSA Assay (FDA approved for NASAL specimens only), is one component of a comprehensive MRSA colonization surveillance program. It is not intended to diagnose MRSA infection nor to guide or monitor treatment for MRSA infections. Test performance is not FDA approved in patients less than 103 years old. Performed at [facility], [address], [place], [place] [address]       Radiology Studies: ECHOCARDIOGRAM COMPLETE Result Date: 06/12/2023    ECHOCARDIOGRAM REPORT   Patient Name:   [name] Date of Exam: 06/12/2023 Medical Rec #:  [id number]      Height:       61.5 in Accession #:    [id number]     Weight:       115.1 lb Date of Birth:  08-01-1928     BSA:          1.502 m Patient Age:    89 years       BP:           99/24 mmHg Patient Gender: F              HR:           71 bpm. Exam Location:  Inpatient Procedure: 2D Echo, Cardiac Doppler and Color Doppler (Both Spectral and Color            Flow Doppler were utilized during procedure). Indications:    Syncope R55  History:        Patient has prior history of Echocardiogram examinations, most                 recent 07/25/2019.  Sonographer:    Harriette Bouillon RDCS Referring Phys: [phone] Henlee Donovan A Candia Kingsbury IMPRESSIONS  1. Left ventricular ejection fraction, by estimation, is 65 to 70%. The left ventricle has normal function.  The left ventricle has no regional wall motion abnormalities. There is mild left ventricular hypertrophy. Left ventricular diastolic parameters are consistent with Grade I diastolic dysfunction (impaired relaxation).  2. Right ventricular systolic function is normal. The right ventricular size is not well visualized.  3. Left atrial size was moderately dilated.  4. The mitral valve was not well visualized. No evidence of mitral valve regurgitation.  5. The aortic valve is tricuspid. Aortic valve regurgitation is mild to moderate. Aortic valve sclerosis/calcification is present, without any evidence of aortic stenosis.  6. The inferior vena cava is normal in size with greater than 50% respiratory variability, suggesting right atrial pressure of 3 mmHg. Conclusion(s)/Recommendation(s): If normal rhythm, consider non cardiac cause for syncope. FINDINGS  Left Ventricle: Left ventricular ejection fraction,  by estimation, is 65 to 70%. The left ventricle has normal function. The left ventricle has no regional wall motion abnormalities. Strain imaging was not performed. The left ventricular internal cavity  size was normal in size. There is mild left ventricular hypertrophy. Left ventricular diastolic parameters are consistent with Grade I diastolic dysfunction (impaired relaxation). Right Ventricle: The right ventricular size is not well visualized. Right ventricular systolic function is normal. Left Atrium: Left atrial size was moderately dilated. Right Atrium: Right atrial size was normal in size. Pericardium: There is no evidence of pericardial effusion. Mitral Valve: The mitral valve was not well visualized. No evidence of mitral valve regurgitation. Tricuspid Valve: Tricuspid valve regurgitation is not demonstrated. Aortic Valve: The aortic valve is tricuspid. Aortic valve regurgitation is mild to moderate. Aortic valve sclerosis/calcification is present, without any evidence of aortic stenosis. Pulmonic Valve:  Pulmonic valve regurgitation is not visualized. Aorta: The aortic root and ascending aorta are structurally normal, with no evidence of dilitation. Venous: The inferior vena cava is normal in size with greater than 50% respiratory variability, suggesting right atrial pressure of 3 mmHg. IAS/Shunts: The interatrial septum was not well visualized. Additional Comments: 3D imaging was not performed.  LEFT VENTRICLE PLAX 2D LVIDd:         3.90 cm   Diastology LVIDs:         2.10 cm   LV e' medial:    3.48 cm/s LV PW:         1.10 cm   LV E/e' medial:  22.6 LV IVS:        1.00 cm   LV e' lateral:   7.07 cm/s LVOT diam:     2.00 cm   LV E/e' lateral: 11.1 LV SV:         73 LV SV Index:   49 LVOT Area:     3.14 cm  RIGHT VENTRICLE         IVC TAPSE (M-mode): 2.0 cm  IVC diam: 1.70 cm LEFT ATRIUM           Index LA diam:      4.40 cm 2.93 cm/m LA Vol (A4C): 62.5 ml 41.60 ml/m  AORTIC VALVE LVOT Vmax:   98.20 cm/s LVOT Vmean:  65.300 cm/s LVOT VTI:    0.232 m  AORTA Ao Root diam: 2.80 cm Ao Asc diam:  2.60 cm MITRAL VALVE MV Area (PHT): 2.29 cm    SHUNTS MV Decel Time: 331 msec    Systemic VTI:  0.23 m MV E velocity: 78.60 cm/s  Systemic Diam: 2.00 cm MV A velocity: 95.90 cm/s MV E/A ratio:  0.82 [place] signed by Carolan Clines Signature Date/Time: 06/12/2023/3:48:48 PM    Final    CT HEAD WO CONTRAST ( ) Result Date: 06/12/2023 CLINICAL DATA:  Initial evaluation for mental status change, unknown cause. EXAM: CT HEAD WITHOUT CONTRAST TECHNIQUE: Contiguous axial images were obtained from the base of the skull through the vertex without intravenous contrast. RADIATION DOSE REDUCTION: This exam was performed according to the departmental dose-optimization program which includes automated exposure control, adjustment of the mA and/or kV according to patient size and/or use of iterative reconstruction technique. COMPARISON:  Prior CT from 03/10/2021 FINDINGS: Brain: Examination degraded by motion artifact  and positioning. Generalized age-related cerebral atrophy. Patchy hypodensity involving the supratentorial cerebral white matter, most characteristic of chronic microvascular ischemic disease, moderate in nature. 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 No acute intracranial hemorrhage. No acute large vessel  territory infarct. No mass lesion or midline shift. No hydrocephalus or extra-axial fluid collection. Vascular: No abnormal hyperdense vessel. Extensive calcified atherosclerosis present at the skull base. Skull: Scalp soft tissues demonstrate no acute finding. Calvarium intact. Sinuses/Orbits: Globes and orbital soft tissues within normal limits. Visualized paranasal sinuses are clear. No significant mastoid effusion. Other: None. IMPRESSION: 1. No acute intracranial abnormality. 2. Generalized age-related cerebral atrophy with moderate chronic microvascular ischemic disease. Small remote left cerebellar infarct. Electronically Signed   By: Rise Mu M.D.   On: 06/12/2023 01:17      LOS: 1 day    Jacquelin Hawking, MD [facility] 06/13/2023, 8:01 AM   If 7PM-7AM, please contact night-coverage [email or web address]

## 2023-06-14 DIAGNOSIS — K5731 Diverticulosis of large intestine without perforation or abscess with bleeding: Secondary | ICD-10-CM | POA: Diagnosis not present

## 2023-06-14 LAB — HEMOGLOBIN AND HEMATOCRIT, BLOOD
HCT: 23.1 % — ABNORMAL LOW (ref 36.0–46.0)
HCT: 24.1 % — ABNORMAL LOW (ref 36.0–46.0)
Hemoglobin: 7.6 g/dL — ABNORMAL LOW (ref 12.0–15.0)
Hemoglobin: 7.8 g/dL — ABNORMAL LOW (ref 12.0–15.0)

## 2023-06-14 MED ORDER — METOPROLOL TARTRATE 12.5 MG HALF TABLET
12.5000 mg | ORAL_TABLET | Freq: Two times a day (BID) | ORAL | Status: DC
  Administered 2023-06-14: 12.5 mg via ORAL
  Filled 2023-06-14: qty 1

## 2023-06-14 MED ORDER — ORAL CARE MOUTH RINSE: 15.0000 mL | OROMUCOSAL | Status: DC | PRN

## 2023-06-14 NOTE — [place] FL2 (Signed)
 [place] MEDICAID FL2 LEVEL OF CARE FORM     IDENTIFICATION  Patient Name: [name] Birthdate: 27-Mar-1929 Sex: female Admission Date (Current Location): 06/11/2023  [facility] and [place] Number:  Producer, television/film/video and Address:  [facility], [address], [place], [place] [address]      Provider Number: [id number]  Attending Physician Name and Address:  Narda Bonds, MD  Relative Name and Phone Number:  [name]; Daughter/HCPOA; [phone]    Current Level of Care: Hospital Recommended Level of Care: Skilled Nursing Facility Prior Approval Number:    Date Approved/Denied:   PASRR Number: [id number]  Discharge Plan: SNF    Current Diagnoses: Patient Active Problem List   Diagnosis Date Noted   Acute GI bleeding 06/13/2023   Bright red blood per rectum 06/11/2023   Acute blood loss anemia 06/11/2023   Vertebral artery dissection (HCC) 03/31/2022   Overflow incontinence of urine 12/17/2020   Ankle edema, bilateral 12/17/2020   S/P right hip fracture 10/19/2020   Glaucoma 03/19/2020   Dyslipidemia 03/19/2020   Memory loss 03/19/2020   Syncope 05/03/2018   AF (paroxysmal atrial fibrillation) (HCC) 05/03/2018   Diverticular hemorrhage 07/31/2012   DM2 (diabetes mellitus, type 2) (HCC) 07/31/2012   HTN (hypertension) 07/31/2012    Orientation RESPIRATION BLADDER Height & Weight     Self  Normal (Room Air) External catheter, Incontinent Weight: 127 lb 6.8 oz (57.8 kg) Height:  5' (152.4 cm)  BEHAVIORAL SYMPTOMS/MOOD NEUROLOGICAL BOWEL NUTRITION STATUS      Incontinent Diet (Please see dc summary)  AMBULATORY STATUS COMMUNICATION OF NEEDS Skin   Extensive Assist Verbally Normal                       Personal Care Assistance Level of Assistance  Bathing, Feeding, Dressing Bathing Assistance: Maximum assistance Feeding assistance: Maximum assistance Dressing Assistance: Maximum assistance     Functional  Limitations Info             SPECIAL CARE FACTORS FREQUENCY  PT (By licensed PT), OT (By licensed OT)     PT Frequency: 5x OT Frequency: 5x            Contractures Contractures Info: Not present    Additional Factors Info  Code Status, Allergies Code Status Info: DNR- Pre-Arrest Interventions Desired Allergies Info: Penicillins; Aricept (Donepezil); Benadryl (Diphenhydramine)           Current Medications (06/14/2023):  This is the current hospital active medication list Current Facility-Administered Medications  Medication Dose Route Frequency Provider Last Rate Last Admin   0.9 %  sodium chloride infusion (Manually program via Guardrails IV Fluids)   Intravenous Once [place], Amy S, PA-C       acetaminophen (TYLENOL) tablet 650 mg  650 mg Oral Q6H PRN Narda Bonds, MD       Or   acetaminophen (TYLENOL) suppository 650 mg  650 mg Rectal Q6H PRN Narda Bonds, MD       cyanocobalamin (VITAMIN B12) tablet 1,000 mcg  1,000 mcg Oral Daily Narda Bonds, MD   1,000 mcg at 06/14/23 0813   memantine (NAMENDA) tablet 10 mg  10 mg Oral QHS Narda Bonds, MD   10 mg at 06/13/23 2044   ondansetron (ZOFRAN) tablet 4 mg  4 mg Oral Q6H PRN Narda Bonds, MD       Or   ondansetron [facility]) injection 4 mg  4 mg  Intravenous Q6H PRN Narda Bonds, MD       Oral care mouth rinse  15 mL Mouth Rinse PRN Narda Bonds, MD       oxybutynin (DITROPAN) tablet 5 mg  5 mg Oral QHS Narda Bonds, MD   5 mg at 06/13/23 2044   polyethylene glycol (MIRALAX / GLYCOLAX) packet 17 g  17 g Oral Daily PRN Narda Bonds, MD       rivastigmine (EXELON) 4.6 mg/24hr 4.6 mg  4.6 mg Transdermal Daily Narda Bonds, MD   4.6 mg at 06/14/23 1610     Discharge Medications: Please see discharge summary for a list of discharge medications.  Relevant Imaging Results:  Relevant Lab Results:   Additional Information SS# [id number]  Marliss Coots, LCSW

## 2023-06-14 NOTE — Progress Notes (Signed)
 PROGRESS NOTE    [name]  ZOX:[id number] DOB: 06-Sep-1928 DOA: 06/11/2023 PCP: Hannah Salisbury, [name]   Brief Narrative: [name] is a 89 y.o. female with a history of diabetes mellitus type 2, glaucoma, paroxysmal atrial fibrillation/flutter, hypertension, diverticulosis.  Patient presented secondary to rectal bleeding with concern for lower [name] bleed. [name] consulted with recommendation for conservative management. Hospitalization complicated by an episode of transient loss of consciousness concerning for vasovagal event. CT head negative. Trending hemoglobin.   Assessment and Plan:  [name] bleed Patient with red blood per rectum with clots. Complicated by Eliquis use. Patient has a history of diverticulosis. [facility] consulted by EDP. No bowel movement for the last 24-48 hours. [name] signed off.   Acute blood loss anemia Baseline hemoglobin of 12 from 2023. Hemoglobin of 10.5 on admission. Anemia secondary to acute [name] bleeding. Hemoglobin with downtrend to 7.6 today -Continue serial H&H until hemoglobin is stable   Paroxysmal atrial fibrillation Currently in sinus rhythm with bradycardia. Patient is listed as taking Lopressor and Eliquis as an outpatient. Metoprolol initially held secondary to bradycardia. -Resume metoprolol -Hold Eliquis secondary to [name] bleeding   Dementia Patient is listed as taking Namenda and rivastigmine, however medication reconciliation is pending fax from facility. -Continue Namenda and rivastigmine   Diabetes mellitus type 2 Controlled based on last hemoglobin A1C of 6.2% from 2023. Patient is not listed as taking outpatient medication for management.   Glaucoma Patient is no longer on eye drop medication.  Resting tremor Patient reports no prior history. Concern this could be related to underlying neurologic disease. Tremor is bilateral. Patient will need neurology follow-up as an outpatient.  Syncope Unclear etiology. Seems to  likely be secondary to vasovagal episode. CT head negative. Hannah ordered and is significant for no seizures or epileptiform discharges. Transthoracic Echocardiogram ordered and without etiology for syncope. Patient placed on telemetry. Per chart review, patient has seen neurology in the past for a similar episode, without diagnosis.   DVT prophylaxis: SCDs Code Status:   Code Status: Do not attempt resuscitation (DNR) PRE-ARREST INTERVENTIONS DESIRED Family Communication: None at bedside. Disposition Plan: Discharge pending stable hemoglobin   Consultants:  [name]  Procedures:  Hannah  Antimicrobials: None    Subjective: No concerns this morning.  Objective: BP 123/60 (BP Location: Left Arm)   Pulse 71   Temp 97.8 F (36.6 C) (Oral)   Resp 17   Ht 5' (1.524 m)   Wt 57.8 kg   SpO2 97%   BMI 24.89 kg/m   Examination:  General exam: Appears calm and comfortable Respiratory system: Clear to auscultation. Respiratory effort normal. Cardiovascular system: S1 & S2 heard, RRR. No murmurs, rubs, gallops or clicks. Gastrointestinal system: Abdomen is nondistended, soft and mildly tender. Normal bowel sounds heard. Central nervous system: Alert and oriented. No focal neurological deficits. Psychiatry: Judgement and insight appear normal. Mood & affect appropriate.    Data Reviewed: I have personally reviewed following labs and imaging studies  CBC Lab Results  Component Value Date   WBC 5.8 06/11/2023   RBC 3.46 (L) 06/11/2023   HGB 7.6 (L) 06/14/2023   HCT 23.1 (L) 06/14/2023   MCV 93.1 06/11/2023   MCH 30.3 06/11/2023   PLT 162 06/11/2023   MCHC 32.6 06/11/2023   RDW 13.3 06/11/2023   LYMPHSABS 2.0 01/28/2022   MONOABS 0.4 01/28/2022   EOSABS 0.1 01/28/2022   BASOSABS 0.0 01/28/2022     Last metabolic panel Lab Results  Component Value Date   NA 140 06/11/2023   K 3.8 06/11/2023   CL 105 06/11/2023   CO2 24 06/11/2023   BUN 14 06/11/2023   CREATININE  0.68 06/11/2023   GLUCOSE 169 (H) 06/11/2023   GFRNONAA >60 06/11/2023   GFRAA 48 (L) 01/05/2020   CALCIUM 9.1 06/11/2023   PHOS 4.0 10/20/2020   PROT 6.5 01/28/2022   ALBUMIN 3.8 01/28/2022   BILITOT 1.2 01/28/2022   ALKPHOS 95 01/28/2022   AST 11 01/28/2022   ALT 8 01/28/2022   ANIONGAP 11 06/11/2023    GFR: Estimated Creatinine Clearance: 35.7 mL/min (by C-G formula based on SCr of 0.68 mg/dL).  Recent Results (from the past 240 hours)  MRSA Next Gen by PCR, Nasal     Status: None   Collection Time: 06/11/23  8:34 PM   Specimen: Nasal Mucosa; Nasal Swab  Result Value Ref Range Status   MRSA by PCR Next Gen NOT DETECTED NOT DETECTED Final    Comment: (NOTE) The GeneXpert MRSA Assay (FDA approved for NASAL specimens only), is one component of a comprehensive MRSA colonization surveillance program. It is not intended to diagnose MRSA infection nor to guide or monitor treatment for MRSA infections. Test performance is not FDA approved in patients less than 20 years old. Performed at [facility], [address], [place], [place] [address]       Radiology Studies: [name]     06/13/2023 10:12 PM Patient Name: [name] MRN: [id number] Epilepsy Attending: [name] Referring Physician/Provider: Narda Bonds, [name] Date: 06/12/2023 Duration: 23.47 mins Patient history: 89yo F with syncope. Hannah to evaluate for seizure Level of alertness: Awake, asleep AEDs during Hannah study: None Technical aspects: This Hannah study was done with scalp electrodes positioned according to the 10-20 International system of electrode placement. Electrical activity was reviewed with band pass filter of 1-70Hz , sensitivity of 7 uV/mm, display speed of 14mm/sec with a 60Hz  notched filter applied as appropriate. Hannah data were recorded continuously and digitally stored.  Video monitoring was available and reviewed as appropriate. Description: The  posterior dominant rhythm consists of 7 Hz activity of moderate voltage (25-35 uV) seen predominantly in posterior head regions, symmetric and reactive to eye opening and eye closing. Sleep was characterized by vertex waves, sleep spindles (12 to 14 Hz), maximal frontocentral region. Hannah showed continuous generalized 5 to 7 Hz theta slowing. Hyperventilation and photic stimulation were not performed.   ABNORMALITY - Continuous slow, generalized IMPRESSION: This study is suggestive of moderate diffuse encephalopathy. No seizures or epileptiform discharges were seen throughout the recording. [name]   ECHOCARDIOGRAM COMPLETE Result Date: 06/12/2023    ECHOCARDIOGRAM REPORT   Patient Name:   [name] Date of Exam: 06/12/2023 Medical Rec #:  [id number]      Height:       61.5 in Accession #:    [id number]     Weight:       115.1 lb Date of Birth:  20-Oct-1928     BSA:          1.502 m Patient Age:    89 years       BP:           99/24 mmHg Patient Gender: F              HR:           71 bpm. Exam Location:  Inpatient Procedure: 2D Echo, Cardiac Doppler and Color Doppler (Both Spectral and Color            Flow Doppler were utilized during procedure). Indications:    Syncope R55  History:        Patient has prior history of Echocardiogram examinations, most                 recent 07/25/2019.  Sonographer:    Harriette Bouillon RDCS Referring Phys: [phone] Shawnita Krizek A Rocket Gunderson IMPRESSIONS  1. Left ventricular ejection fraction, by estimation, is 65 to 70%. The left ventricle has normal function. The left ventricle has no regional wall motion abnormalities. There is mild left ventricular hypertrophy. Left ventricular diastolic parameters are consistent with Grade I diastolic dysfunction (impaired relaxation).  2. Right ventricular systolic function is normal. The right ventricular size is not well visualized.  3. Left atrial size was moderately dilated.  4. The mitral valve was not well visualized. No evidence of mitral  valve regurgitation.  5. The aortic valve is tricuspid. Aortic valve regurgitation is mild to moderate. Aortic valve sclerosis/calcification is present, without any evidence of aortic stenosis.  6. The inferior vena cava is normal in size with greater than 50% respiratory variability, suggesting right atrial pressure of 3 mmHg. Conclusion(s)/Recommendation(s): If normal rhythm, consider non cardiac cause for syncope. FINDINGS  Left Ventricle: Left ventricular ejection fraction, by estimation, is 65 to 70%. The left ventricle has normal function. The left ventricle has no regional wall motion abnormalities. Strain imaging was not performed. The left ventricular internal cavity  size was normal in size. There is mild left ventricular hypertrophy. Left ventricular diastolic parameters are consistent with Grade I diastolic dysfunction (impaired relaxation). Right Ventricle: The right ventricular size is not well visualized. Right ventricular systolic function is normal. Left Atrium: Left atrial size was moderately dilated. Right Atrium: Right atrial size was normal in size. Pericardium: There is no evidence of pericardial effusion. Mitral Valve: The mitral valve was not well visualized. No evidence of mitral valve regurgitation. Tricuspid Valve: Tricuspid valve regurgitation is not demonstrated. Aortic Valve: The aortic valve is tricuspid. Aortic valve regurgitation is mild to moderate. Aortic valve sclerosis/calcification is present, without any evidence of aortic stenosis. Pulmonic Valve: Pulmonic valve regurgitation is not visualized. Aorta: The aortic root and ascending aorta are structurally normal, with no evidence of dilitation. Venous: The inferior vena cava is normal in size with greater than 50% respiratory variability, suggesting right atrial pressure of 3 mmHg. IAS/Shunts: The interatrial septum was not well visualized. Additional Comments: 3D imaging was not performed.  LEFT VENTRICLE PLAX 2D LVIDd:          3.90 cm   Diastology LVIDs:         2.10 cm   LV e' medial:    3.48 cm/s LV PW:         1.10 cm   LV E/e' medial:  22.6 LV IVS:        1.00 cm   LV e' lateral:   7.07 cm/s LVOT diam:     2.00 cm   LV E/e' lateral: 11.1 LV SV:         73 LV SV Index:   49 LVOT Area:     3.14 cm  RIGHT VENTRICLE         IVC TAPSE (M-mode): 2.0 cm  IVC diam: 1.70 cm LEFT ATRIUM           Index LA diam:  4.40 cm 2.93 cm/m LA Vol (A4C): 62.5 ml 41.60 ml/m  AORTIC VALVE LVOT Vmax:   98.20 cm/s LVOT Vmean:  65.300 cm/s LVOT VTI:    0.232 m  AORTA Ao Root diam: 2.80 cm Ao Asc diam:  2.60 cm MITRAL VALVE MV Area (PHT): 2.29 cm    SHUNTS MV Decel Time: 331 msec    Systemic VTI:  0.23 m MV E velocity: 78.60 cm/s  Systemic Diam: 2.00 cm MV A velocity: 95.90 cm/s MV E/A ratio:  0.82 [place] signed by Carolan Clines Signature Date/Time: 06/12/2023/3:48:48 PM    Final       LOS: 2 days    Jacquelin Hawking, [name] [facility] 06/14/2023, 1:52 PM   If 7PM-7AM, please contact night-coverage [email or web address]

## 2023-06-14 NOTE — TOC Progression Note (Addendum)
 Transition of Care [facility]) - Progression Note    Patient Details  Name: [name] MRN: [id number] Date of Birth: [birth year]  [facility] CM/SW Contact  Marliss Coots, LCSW Phone Number: 06/14/2023, 12:59 PM  Clinical Narrative:     12:59 PM CSW called patient's daughter, Larita Fife (patient's spouse phone number is disconnected) regarding SNF and HCPOA. Larita Fife confirmed patient is to return to [facility] via ambulance transportation (CSW made [place] aware of possible copay, which Larita Fife expressed understanding of and continued interest in ambulance transportation for patient discharge). Larita Fife also confirmed that she is HCPOA. CSW requested Larita Fife to provide documentation. Larita Fife consented to email CSW documentation. CSW provided [place] with email address.  4:43 PM CSW received HCPOA from [place] via email ([email or web address]) CSW placed HCPOA on patient's hard chart and relayed this to medical team.  Expected Discharge Plan: Skilled Nursing Facility Barriers to Discharge: Continued Medical Work up  Expected Discharge Plan and Services In-house Referral: Clinical Social Work   Post Acute Care Choice: Skilled Nursing Facility Living arrangements for the past 2 months: Skilled Nursing Facility                                       Social Determinants of Health (SDOH) Interventions SDOH Screenings   Food Insecurity: No Food Insecurity (06/11/2023)  Housing: Low Risk  (06/11/2023)  Transportation Needs: No Transportation Needs (06/11/2023)  Utilities: Not At Risk (06/11/2023)  Alcohol Screen: Low Risk  (07/05/2020)  Depression (PHQ2-9): Low Risk  (12/02/2021)  Financial Resource Strain: Low Risk  (04/14/2022)   Received from [facility], [facility]  Physical Activity: Sufficiently Active (07/15/2021)  Social Connections: Unknown (06/11/2023)  Stress: No Stress Concern Present (04/14/2022)   Received from [facility], [facility]  Tobacco Use: Low Risk  (06/11/2023)     Readmission Risk Interventions     No data to display

## 2023-06-15 DIAGNOSIS — R55 Syncope and collapse: Secondary | ICD-10-CM | POA: Diagnosis not present

## 2023-06-15 DIAGNOSIS — I482 Chronic atrial fibrillation, unspecified: Secondary | ICD-10-CM | POA: Diagnosis not present

## 2023-06-15 DIAGNOSIS — R269 Unspecified abnormalities of gait and mobility: Secondary | ICD-10-CM | POA: Diagnosis not present

## 2023-06-15 DIAGNOSIS — I10 Essential (primary) hypertension: Secondary | ICD-10-CM | POA: Diagnosis not present

## 2023-06-15 DIAGNOSIS — K625 Hemorrhage of anus and rectum: Secondary | ICD-10-CM | POA: Diagnosis not present

## 2023-06-15 DIAGNOSIS — N3281 Overactive bladder: Secondary | ICD-10-CM | POA: Diagnosis not present

## 2023-06-15 DIAGNOSIS — M6281 Muscle weakness (generalized): Secondary | ICD-10-CM | POA: Diagnosis not present

## 2023-06-15 DIAGNOSIS — K922 Gastrointestinal hemorrhage, unspecified: Secondary | ICD-10-CM | POA: Diagnosis not present

## 2023-06-15 DIAGNOSIS — N390 Urinary tract infection, site not specified: Secondary | ICD-10-CM | POA: Diagnosis not present

## 2023-06-15 DIAGNOSIS — I4891 Unspecified atrial fibrillation: Secondary | ICD-10-CM | POA: Diagnosis not present

## 2023-06-15 DIAGNOSIS — R58 Hemorrhage, not elsewhere classified: Secondary | ICD-10-CM | POA: Diagnosis not present

## 2023-06-15 DIAGNOSIS — R260 Ataxic gait: Secondary | ICD-10-CM | POA: Diagnosis not present

## 2023-06-15 DIAGNOSIS — Z79899 Other long term (current) drug therapy: Secondary | ICD-10-CM | POA: Diagnosis not present

## 2023-06-15 DIAGNOSIS — I951 Orthostatic hypotension: Secondary | ICD-10-CM | POA: Diagnosis not present

## 2023-06-15 DIAGNOSIS — E119 Type 2 diabetes mellitus without complications: Secondary | ICD-10-CM | POA: Diagnosis not present

## 2023-06-15 DIAGNOSIS — Z0279 Encounter for issue of other medical certificate: Secondary | ICD-10-CM

## 2023-06-15 DIAGNOSIS — F0392 Unspecified dementia, unspecified severity, with psychotic disturbance: Secondary | ICD-10-CM | POA: Diagnosis not present

## 2023-06-15 DIAGNOSIS — I480 Paroxysmal atrial fibrillation: Secondary | ICD-10-CM | POA: Diagnosis not present

## 2023-06-15 DIAGNOSIS — R278 Other lack of coordination: Secondary | ICD-10-CM | POA: Diagnosis not present

## 2023-06-15 DIAGNOSIS — D62 Acute posthemorrhagic anemia: Secondary | ICD-10-CM | POA: Diagnosis not present

## 2023-06-15 DIAGNOSIS — K5792 Diverticulitis of intestine, part unspecified, without perforation or abscess without bleeding: Secondary | ICD-10-CM | POA: Diagnosis not present

## 2023-06-15 DIAGNOSIS — Z7401 Bed confinement status: Secondary | ICD-10-CM | POA: Diagnosis not present

## 2023-06-15 DIAGNOSIS — I959 Hypotension, unspecified: Secondary | ICD-10-CM | POA: Diagnosis not present

## 2023-06-15 DIAGNOSIS — R0689 Other abnormalities of breathing: Secondary | ICD-10-CM | POA: Diagnosis not present

## 2023-06-15 DIAGNOSIS — K5731 Diverticulosis of large intestine without perforation or abscess with bleeding: Secondary | ICD-10-CM | POA: Diagnosis not present

## 2023-06-15 DIAGNOSIS — R531 Weakness: Secondary | ICD-10-CM | POA: Diagnosis not present

## 2023-06-15 DIAGNOSIS — R1312 Dysphagia, oropharyngeal phase: Secondary | ICD-10-CM | POA: Diagnosis not present

## 2023-06-15 LAB — TYPE AND SCREEN
ABO/RH(D): O NEG
Antibody Screen: NEGATIVE
Unit division: 0
Unit division: 0

## 2023-06-15 LAB — BPAM RBC
Blood Product Expiration Date: 202503012359
Blood Product Expiration Date: 202503082359
Blood Product Expiration Date: 202503112359
ISSUE DATE / TIME: 202502282028
Unit Type and Rh: 9500
Unit Type and Rh: 9500
Unit Type and Rh: 9500

## 2023-06-15 NOTE — Care Management Important Message (Signed)
 Important Message  Patient Details  Name: [name] MRN: [id number] Date of Birth: March 15, 1929   Important Message Given:  Yes - Medicare IM     Dorena Bodo 06/15/2023, 2:47 PM

## 2023-06-15 NOTE — Discharge Summary (Addendum)
 Physician Discharge Summary   Patient: [name] MRN: [id number] DOB: 23-Oct-1928  Admit date:     06/11/2023  Discharge date: 06/15/23  Discharge Physician: Jacquelin Hawking, MD   PCP: Nelwyn Salisbury, MD   Recommendations at discharge:  PCP visit for hospital follow-up Cardiology visit for anticoagulation risk discussion GI follow-up as needed Repeat CBC in 3-5 days  Discharge Diagnoses: Principal Problem:   Bright red blood per rectum Active Problems:   Diverticular hemorrhage   DM2 (diabetes mellitus, type 2) (HCC)   HTN (hypertension)   AF (paroxysmal atrial fibrillation) (HCC)   Acute blood loss anemia   Acute GI bleeding  Resolved Problems:   * No resolved hospital problems. *  Hospital Course: [name] is a 89 y.o. female with a history of diabetes mellitus type 2, glaucoma, paroxysmal atrial fibrillation/flutter, hypertension, diverticulosis.  Patient presented secondary to rectal bleeding with concern for lower GI bleed. GI consulted with recommendation for conservative management. Hospitalization complicated by an episode of transient loss of consciousness concerning for vasovagal event. CT head negative. GI bleeding and hemoglobin stabilized.   Assessment and Plan:  GI bleed Patient with red blood per rectum with clots. Complicated by Eliquis use. Patient has a history of diverticulosis. [facility] consulted by EDP. No bowel movement for the last 24-48 hours. GI signed off.   Acute blood loss anemia Baseline hemoglobin of 12 from 2023. Hemoglobin of 10.5 on admission. Anemia secondary to acute GI bleeding. Hemoglobin with downtrend to 7.6 and stabilized at 7.8. Repeat CBC in 3-5 days.   Paroxysmal atrial fibrillation Currently in sinus rhythm with bradycardia. Patient is listed as taking Lopressor and Eliquis as an outpatient. Metoprolol initially held secondary to bradycardia. Bradycardia improved and metoprolol restarted. Hold Eliquis on  discharge.   Dementia Patient is listed as taking Namenda and rivastigmine, however medication reconciliation is pending fax from facility. Continue Namenda and rivastigmine.   Diabetes mellitus type 2 Controlled based on last hemoglobin A1C of 6.2% from 2023. Patient is not listed as taking outpatient medication for management.   History of glaucoma Patient is no longer on eye drop medication.   Resting tremor Patient reports no prior history. Concern this could be related to underlying neurologic disease. Tremor is bilateral. Patient will need neurology follow-up as an outpatient.   Syncope Unclear etiology. Seems to likely be secondary to vasovagal episode. CT head negative. EEG ordered and is significant for no seizures or epileptiform discharges. Transthoracic Echocardiogram ordered and without etiology for syncope. Patient placed on telemetry. Per chart review, patient has seen neurology in the past for a similar episode, without diagnosis.  Tremors Unclear chronicity. Resting with worsening on intention. Bilateral upper extremity. Will recommend neurology follow-up.   Consultants:  [facility]   Procedures:  EEG  Disposition: Skilled nursing facility Diet recommendation: Carb modified diet   DISCHARGE MEDICATION: Allergies as of 06/15/2023       Reactions   Penicillins Anaphylaxis, Other (See Comments)   Did it involve swelling of the face/tongue/throat, SOB, or low BP? Yes Did it involve sudden or severe rash/hives, skin peeling, or any reaction on the inside of your mouth or nose? Yes Did you need to seek medical attention at a hospital or doctor's office? Yes When did it last happen? Unknown If all above answers are "NO", may proceed with cephalosporin use. Did it involve swelling of the face/tongue/throat, SOB, or low BP? Yes Did it involve sudden or severe rash/hives, skin peeling, or  any reaction on the inside of your mouth or nose? Yes Did you need to seek  medical attention at a hospital or doctor's office? Yes When did it last happen? Unknown If all above answers are "NO", may proceed with cephalosporin use.   Aricept [donepezil] Diarrhea   Benadryl [diphenhydramine] Rash        Medication List     PAUSE taking these medications    Eliquis 2.5 MG Tabs tablet Wait to take this until your doctor or other care provider tells you to start again. Please discuss with your cardiologist as to when to consider restarting this medication versus stopping indefinitely. Generic drug: apixaban TAKE 1 TABLET BY MOUTH TWICE DAILY       TAKE these medications    acetaminophen 500 MG tablet Commonly known as: TYLENOL Take 500-1,000 mg by mouth every 6 (six) hours as needed for mild pain (pain score 1-3) or moderate pain (pain score 4-6).   atorvastatin 80 MG tablet Commonly known as: LIPITOR Take 80 mg by mouth at bedtime.   cyanocobalamin 1000 MCG tablet Commonly known as: VITAMIN B12 Take 1,000 mcg by mouth daily.   memantine 10 MG tablet Commonly known as: NAMENDA Take 10 mg by mouth at bedtime.   metoprolol tartrate 25 MG tablet Commonly known as: LOPRESSOR Take 0.5 tablets (12.5 mg total) by mouth 2 (two) times daily. What changed: how much to take   oxybutynin 5 MG tablet Commonly known as: DITROPAN Take 5 mg by mouth at bedtime.   rivastigmine 4.6 mg/24hr Commonly known as: Exelon Place 1 patch (4.6 mg total) onto the skin daily.   senna 8.6 MG Tabs tablet Commonly known as: SENOKOT Take 2 tablets by mouth in the morning and at bedtime.        Follow-up Information     Nelwyn Salisbury, MD. Schedule an appointment as soon as possible for a visit in 1 week(s).   Specialty: Family Medicine Why: For hospital follow-up Contact information: [address] [place] [address] [phone]         Newman Nip, NP. Schedule an appointment as soon as possible for a visit in 1 week(s).   Specialties:  Nurse Practitioner, Cardiology Why: Anticoagulation discussion               Discharge Exam: BP (!) 103/50 (BP Location: Left Arm)   Pulse 71   Temp 98.3 F (36.8 C) (Oral)   Resp 20   Ht 5' (1.524 m)   Wt 57.8 kg   SpO2 93%   BMI 24.89 kg/m   General exam: Appears calm and comfortable Respiratory system: Clear to auscultation. Respiratory effort normal. Cardiovascular system: S1 & S2 heard, RRR. Gastrointestinal system: Abdomen is nondistended, soft and nontender. Normal bowel sounds heard. Central nervous system: Alert and oriented. Musculoskeletal: No edema. No calf tenderness  Condition at discharge: stable  The results of significant diagnostics from this hospitalization (including imaging, microbiology, ancillary and laboratory) are listed below for reference.   Imaging Studies: EEG adult Result Date: 06/13/2023 Charlsie Quest, MD     06/13/2023 10:12 PM Patient Name: BRECKYN TROYER MRN: [id number] Epilepsy Attending: Charlsie Quest Referring Physician/Provider: Narda Bonds, MD Date: 06/12/2023 Duration: 23.47 mins Patient history: 89yo F with syncope. EEG to evaluate for seizure Level of alertness: Awake, asleep AEDs during EEG study: None Technical aspects: This EEG study was done with scalp electrodes positioned according to the 10-20 International system of electrode placement.  Electrical activity was reviewed with band pass filter of 1-70Hz , sensitivity of 7 uV/mm, display speed of 7mm/sec with a 60Hz  notched filter applied as appropriate. EEG data were recorded continuously and digitally stored.  Video monitoring was available and reviewed as appropriate. Description: The posterior dominant rhythm consists of 7 Hz activity of moderate voltage (25-35 uV) seen predominantly in posterior head regions, symmetric and reactive to eye opening and eye closing. Sleep was characterized by vertex waves, sleep spindles (12 to 14 Hz), maximal frontocentral region. EEG showed  continuous generalized 5 to 7 Hz theta slowing. Hyperventilation and photic stimulation were not performed.   ABNORMALITY - Continuous slow, generalized IMPRESSION: This study is suggestive of moderate diffuse encephalopathy. No seizures or epileptiform discharges were seen throughout the recording. Charlsie Quest   ECHOCARDIOGRAM COMPLETE Result Date: 06/12/2023    ECHOCARDIOGRAM REPORT   Patient Name:   SHAKTI FLEER Date of Exam: 06/12/2023 Medical Rec #:  [id number]      Height:       61.5 in Accession #:    [id number]     Weight:       115.1 lb Date of Birth:  1928/11/23     BSA:          1.502 m Patient Age:    89 years       BP:           99/24 mmHg Patient Gender: F              HR:           71 bpm. Exam Location:  Inpatient Procedure: 2D Echo, Cardiac Doppler and Color Doppler (Both Spectral and Color            Flow Doppler were utilized during procedure). Indications:    Syncope R55  History:        Patient has prior history of Echocardiogram examinations, most                 recent 07/25/2019.  Sonographer:    Harriette Bouillon RDCS Referring Phys: [phone] Marylynne Keelin A Hakan Nudelman IMPRESSIONS  1. Left ventricular ejection fraction, by estimation, is 65 to 70%. The left ventricle has normal function. The left ventricle has no regional wall motion abnormalities. There is mild left ventricular hypertrophy. Left ventricular diastolic parameters are consistent with Grade I diastolic dysfunction (impaired relaxation).  2. Right ventricular systolic function is normal. The right ventricular size is not well visualized.  3. Left atrial size was moderately dilated.  4. The mitral valve was not well visualized. No evidence of mitral valve regurgitation.  5. The aortic valve is tricuspid. Aortic valve regurgitation is mild to moderate. Aortic valve sclerosis/calcification is present, without any evidence of aortic stenosis.  6. The inferior vena cava is normal in size with greater than 50% respiratory variability, suggesting  right atrial pressure of 3 mmHg. Conclusion(s)/Recommendation(s): If normal rhythm, consider non cardiac cause for syncope. FINDINGS  Left Ventricle: Left ventricular ejection fraction, by estimation, is 65 to 70%. The left ventricle has normal function. The left ventricle has no regional wall motion abnormalities. Strain imaging was not performed. The left ventricular internal cavity  size was normal in size. There is mild left ventricular hypertrophy. Left ventricular diastolic parameters are consistent with Grade I diastolic dysfunction (impaired relaxation). Right Ventricle: The right ventricular size is not well visualized. Right ventricular systolic function is normal. Left Atrium: Left atrial size was moderately dilated. Right Atrium: Right atrial size  was normal in size. Pericardium: There is no evidence of pericardial effusion. Mitral Valve: The mitral valve was not well visualized. No evidence of mitral valve regurgitation. Tricuspid Valve: Tricuspid valve regurgitation is not demonstrated. Aortic Valve: The aortic valve is tricuspid. Aortic valve regurgitation is mild to moderate. Aortic valve sclerosis/calcification is present, without any evidence of aortic stenosis. Pulmonic Valve: Pulmonic valve regurgitation is not visualized. Aorta: The aortic root and ascending aorta are structurally normal, with no evidence of dilitation. Venous: The inferior vena cava is normal in size with greater than 50% respiratory variability, suggesting right atrial pressure of 3 mmHg. IAS/Shunts: The interatrial septum was not well visualized. Additional Comments: 3D imaging was not performed.  LEFT VENTRICLE PLAX 2D LVIDd:         3.90 cm   Diastology LVIDs:         2.10 cm   LV e' medial:    3.48 cm/s LV PW:         1.10 cm   LV E/e' medial:  22.6 LV IVS:        1.00 cm   LV e' lateral:   7.07 cm/s LVOT diam:     2.00 cm   LV E/e' lateral: 11.1 LV SV:         73 LV SV Index:   49 LVOT Area:     3.14 cm  RIGHT VENTRICLE          IVC TAPSE (M-mode): 2.0 cm  IVC diam: 1.70 cm LEFT ATRIUM           Index LA diam:      4.40 cm 2.93 cm/m LA Vol (A4C): 62.5 ml 41.60 ml/m  AORTIC VALVE LVOT Vmax:   98.20 cm/s LVOT Vmean:  65.300 cm/s LVOT VTI:    0.232 m  AORTA Ao Root diam: 2.80 cm Ao Asc diam:  2.60 cm MITRAL VALVE MV Area (PHT): 2.29 cm    SHUNTS MV Decel Time: 331 msec    Systemic VTI:  0.23 m MV E velocity: 78.60 cm/s  Systemic Diam: 2.00 cm MV A velocity: 95.90 cm/s MV E/A ratio:  0.82 [place] signed by Carolan Clines Signature Date/Time: 06/12/2023/3:48:48 PM    Final    CT HEAD WO CONTRAST ( ) Result Date: 06/12/2023 CLINICAL DATA:  Initial evaluation for mental status change, unknown cause. EXAM: CT HEAD WITHOUT CONTRAST TECHNIQUE: Contiguous axial images were obtained from the base of the skull through the vertex without intravenous contrast. RADIATION DOSE REDUCTION: This exam was performed according to the departmental dose-optimization program which includes automated exposure control, adjustment of the mA and/or kV according to patient size and/or use of iterative reconstruction technique. COMPARISON:  Prior CT from 03/10/2021 FINDINGS: Brain: Examination degraded by motion artifact and positioning. Generalized age-related cerebral atrophy. Patchy hypodensity involving the supratentorial cerebral white matter, most characteristic of chronic microvascular ischemic disease, moderate in nature. 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 No acute intracranial hemorrhage. No acute large vessel territory infarct. No mass lesion or midline shift. No hydrocephalus or extra-axial fluid collection. Vascular: No abnormal hyperdense vessel. Extensive calcified atherosclerosis present at the skull base. Skull: Scalp soft tissues demonstrate no acute finding. Calvarium intact. Sinuses/Orbits: Globes and orbital soft tissues within normal limits. Visualized paranasal sinuses are clear. No significant mastoid  effusion. Other: None. IMPRESSION: 1. No acute intracranial abnormality. 2. Generalized age-related cerebral atrophy with moderate chronic microvascular ischemic disease. Small remote left cerebellar infarct. Electronically Signed   By: Sharlet Salina  Phill Myron M.D.   On: 06/12/2023 01:17    Microbiology: Results for orders placed or performed during the hospital encounter of 06/11/23  MRSA Next Gen by PCR, Nasal     Status: None   Collection Time: 06/11/23  8:34 PM   Specimen: Nasal Mucosa; Nasal Swab  Result Value Ref Range Status   MRSA by PCR Next Gen NOT DETECTED NOT DETECTED Final    Comment: (NOTE) The GeneXpert MRSA Assay (FDA approved for NASAL specimens only), is one component of a comprehensive MRSA colonization surveillance program. It is not intended to diagnose MRSA infection nor to guide or monitor treatment for MRSA infections. Test performance is not FDA approved in patients less than 15 years old. Performed at [facility], [address] [place], [place] [address]     Labs: CBC: Recent Labs  Lab 06/11/23 [phone] 06/11/23 1036 06/13/23 0216 06/13/23 0852 06/13/23 2132 06/14/23 0857 06/14/23 2136  WBC 5.8  --   --   --   --   --   --   HGB 10.5*   < > 7.9* 8.4* 7.7* 7.6* 7.8*  HCT 32.2*   < > 23.4* 25.0* 23.1* 23.1* 24.1*  MCV 93.1  --   --   --   --   --   --   PLT 162  --   --   --   --   --   --    < > = values in this interval not displayed.   Basic Metabolic Panel: Recent Labs  Lab 06/11/23 0623  NA 140  K 3.8  CL 105  CO2 24  GLUCOSE 169*  BUN 14  CREATININE 0.68  CALCIUM 9.1    CBG: Recent Labs  Lab 06/11/23 1350 06/11/23 1718  GLUCAP 105* 194*    Discharge time spent: 35 minutes.  Signed: Jacquelin Hawking, MD [facility] 06/15/2023

## 2023-06-15 NOTE — Discharge Instructions (Addendum)
 [name],  You were in the hospital with GI bleeding. This appears to be related to your known diverticulosis and is likely made worse by your Eliquis use. Your bleeding has stopped and your hemoglobin (blood counts) have stabilized. Please follow-up with your PCP. Also, please follow-up with your cardiologist to discuss your Eliquis use. I will also refer you to a neurologist for your tremors.

## 2023-06-15 NOTE — TOC Transition Note (Signed)
 Transition of Care [facility]) - Discharge Note   Patient Details  Name: [name] MRN: [id number] Date of Birth: [birth year]  [facility] CM/SW Contact:  Marliss Coots, LCSW Phone Number: 06/15/2023, 12:15 PM   Clinical Narrative:     Patient will DC to: [facility] Anticipated DC date: 06/15/2023 Family notified: Kyra Leyland  Transport by: Sharin Mons   Per MD patient ready for DC to [facility]. RN to call report prior to discharge [phone]). RN, patient's family (CSW left voicemail but daughter/HCPOA, Larita Fife, aware of possible discharge today) and facility notified of DC (patient disoriented x3). Discharge Summary and FL2 sent to facility. DC packet on chart. Ambulance transport requested for patient.   CSW will sign off for now as social work intervention is no longer needed. Please consult [place] again if new needs arise.    Final next level of care: Skilled Nursing Facility Barriers to Discharge: Continued Medical Work up   Patient Goals and CMS Choice            Discharge Placement              Patient chooses bed at: [facility] Patient to be transferred to facility by: [facility] Name of family member notified: [name]; Daughter/HCPOA; [phone] Patient and family notified of of transfer: 06/15/23  Discharge Plan and Services Additional resources added to the After Visit Summary for   In-house Referral: Clinical Social Work   Post Acute Care Choice: Skilled Nursing Facility                               Social Drivers of Health (SDOH) Interventions SDOH Screenings   Food Insecurity: No Food Insecurity (06/11/2023)  Housing: Low Risk  (06/11/2023)  Transportation Needs: No Transportation Needs (06/11/2023)  Utilities: Not At Risk (06/11/2023)  Alcohol Screen: Low Risk  (07/05/2020)  Depression (PHQ2-9): Low Risk  (12/02/2021)  Financial Resource Strain: Low Risk  (04/14/2022)   Received  from [facility], [facility]  Physical Activity: Sufficiently Active (07/15/2021)  Social Connections: Unknown (06/11/2023)  Stress: No Stress Concern Present (04/14/2022)   Received from [facility], [facility]  Tobacco Use: Low Risk  (06/11/2023)     Readmission Risk Interventions     No data to display

## 2023-06-15 NOTE — Evaluation (Signed)
 Clinical/Bedside Swallow Evaluation Patient Details  Name: [name] MRN: [id number] Date of Birth: 12-Jul-1928  Today's Date: 06/15/2023 Time: SLP Start Time (ACUTE ONLY): 1116 SLP Stop Time (ACUTE ONLY): 1124 SLP Time Calculation (min) (ACUTE ONLY): 8 min  Past Medical History:  Past Medical History:  Diagnosis Date   Diabetes mellitus without complication (HCC)    Diverticulitis    Glaucoma    Hypercholesterolemia    Hypertension    PAF (paroxysmal atrial fibrillation) (HCC)    sees Rudi Coco NP    Shingles    Past Surgical History:  Past Surgical History:  Procedure Laterality Date   ABDOMINAL HYSTERECTOMY     COLONOSCOPY     per Dr. Elnoria Howard    TOTAL HIP ARTHROPLASTY  2005   left sided   HPI:  [name] is a 89 yo female presenting to ED 2/28 with rectal bleeding. GI consulted and recommended conservative management. PMH includes T2DM, glaucoma, paroxysmal A-fib/flutter, HTN, diverticulosis, dementia    Assessment / Plan / Recommendation  Clinical Impression  RN reports pt was pocketing food this morning, which is supected to be secondary to pt's baseline dementia. Observed with trials of thin liquids and solids without overt s/s of dysphagia or aspiration. No pocketing or oral holding was noted. Suspect cognitive factors may potentially affect intake but feel that pt can continue regular diet if given full supervision and careful hand feeding. SLP will sign off at this time. SLP Visit Diagnosis: Dysphagia, unspecified (R13.10)    Aspiration Risk  Mild aspiration risk    Diet Recommendation Regular;Thin liquid    Liquid Administration via: Cup;Straw Medication Administration: Whole meds with liquid Supervision: Staff to assist with self feeding;Full supervision/cueing for compensatory strategies Compensations: Minimize environmental distractions;Slow rate;Small sips/bites Postural Changes: Seated upright at 90 degrees    Other  Recommendations Oral Care  Recommendations: Oral care BID    Recommendations for follow up therapy are one component of a multi-disciplinary discharge planning process, led by the attending physician.  Recommendations may be updated based on patient status, additional functional criteria and insurance authorization.  Follow up Recommendations No SLP follow up      Assistance Recommended at Discharge    Functional Status Assessment Patient has not had a recent decline in their functional status  Frequency and Duration            Prognosis Prognosis for improved oropharyngeal function: Fair Barriers to Reach Goals: Cognitive deficits      Swallow Study   General HPI: [name] is a 89 yo female presenting to ED 2/28 with rectal bleeding. GI consulted and recommended conservative management. PMH includes T2DM, glaucoma, paroxysmal A-fib/flutter, HTN, diverticulosis, dementia Type of Study: Bedside Swallow Evaluation Previous Swallow Assessment: none in chart Diet Prior to this Study: Regular;Thin liquids (Level 0) Temperature Spikes Noted: No Respiratory Status: Room air History of Recent Intubation: No Behavior/Cognition: Alert;Cooperative;Pleasant mood Oral Cavity Assessment: Within Functional Limits Oral Care Completed by SLP: No Oral Cavity - Dentition: Adequate natural dentition Vision: Functional for self-feeding Self-Feeding Abilities: Able to feed self Patient Positioning: Upright in bed Volitional Cough: Strong Volitional Swallow: Able to elicit    Oral/Motor/Sensory Function Overall Oral Motor/Sensory Function: Within functional limits   Ice Chips Ice chips: Not tested   Thin Liquid Thin Liquid: Within functional limits Presentation: Straw    Nectar Thick Nectar Thick Liquid: Not tested   Honey Thick Honey Thick Liquid: Not tested   Puree Puree: Not tested  Solid     Solid: Within functional limits Presentation: Self Fed      Gwynneth Aliment, M.A., CF-SLP Speech Language  Pathology, Acute Rehabilitation Services  Secure Chat preferred [phone]  06/15/2023,11:34 AM

## 2023-06-16 DIAGNOSIS — K922 Gastrointestinal hemorrhage, unspecified: Secondary | ICD-10-CM | POA: Diagnosis not present

## 2023-06-16 DIAGNOSIS — Z79899 Other long term (current) drug therapy: Secondary | ICD-10-CM | POA: Diagnosis not present

## 2023-06-16 DIAGNOSIS — R260 Ataxic gait: Secondary | ICD-10-CM | POA: Diagnosis not present

## 2023-06-16 DIAGNOSIS — N390 Urinary tract infection, site not specified: Secondary | ICD-10-CM | POA: Diagnosis not present

## 2023-06-16 DIAGNOSIS — E119 Type 2 diabetes mellitus without complications: Secondary | ICD-10-CM | POA: Diagnosis not present

## 2023-06-16 DIAGNOSIS — I10 Essential (primary) hypertension: Secondary | ICD-10-CM | POA: Diagnosis not present

## 2023-06-16 DIAGNOSIS — I4891 Unspecified atrial fibrillation: Secondary | ICD-10-CM | POA: Diagnosis not present

## 2023-06-16 DIAGNOSIS — I480 Paroxysmal atrial fibrillation: Secondary | ICD-10-CM | POA: Diagnosis not present

## 2023-06-16 DIAGNOSIS — D62 Acute posthemorrhagic anemia: Secondary | ICD-10-CM | POA: Diagnosis not present

## 2023-06-18 DIAGNOSIS — K922 Gastrointestinal hemorrhage, unspecified: Secondary | ICD-10-CM | POA: Diagnosis not present

## 2023-06-18 DIAGNOSIS — R1312 Dysphagia, oropharyngeal phase: Secondary | ICD-10-CM | POA: Diagnosis not present

## 2023-06-18 DIAGNOSIS — R278 Other lack of coordination: Secondary | ICD-10-CM | POA: Diagnosis not present

## 2023-06-18 DIAGNOSIS — N390 Urinary tract infection, site not specified: Secondary | ICD-10-CM | POA: Diagnosis not present

## 2023-06-18 DIAGNOSIS — F0392 Unspecified dementia, unspecified severity, with psychotic disturbance: Secondary | ICD-10-CM | POA: Diagnosis not present

## 2023-06-18 DIAGNOSIS — M6281 Muscle weakness (generalized): Secondary | ICD-10-CM | POA: Diagnosis not present

## 2023-06-19 DIAGNOSIS — I4891 Unspecified atrial fibrillation: Secondary | ICD-10-CM | POA: Diagnosis not present

## 2023-06-19 DIAGNOSIS — E119 Type 2 diabetes mellitus without complications: Secondary | ICD-10-CM | POA: Diagnosis not present

## 2023-06-19 DIAGNOSIS — N390 Urinary tract infection, site not specified: Secondary | ICD-10-CM | POA: Diagnosis not present

## 2023-06-19 DIAGNOSIS — I10 Essential (primary) hypertension: Secondary | ICD-10-CM | POA: Diagnosis not present

## 2023-06-20 DIAGNOSIS — R278 Other lack of coordination: Secondary | ICD-10-CM | POA: Diagnosis not present

## 2023-06-20 DIAGNOSIS — K922 Gastrointestinal hemorrhage, unspecified: Secondary | ICD-10-CM | POA: Diagnosis not present

## 2023-06-20 DIAGNOSIS — F0392 Unspecified dementia, unspecified severity, with psychotic disturbance: Secondary | ICD-10-CM | POA: Diagnosis not present

## 2023-06-20 DIAGNOSIS — M6281 Muscle weakness (generalized): Secondary | ICD-10-CM | POA: Diagnosis not present

## 2023-06-20 DIAGNOSIS — R1312 Dysphagia, oropharyngeal phase: Secondary | ICD-10-CM | POA: Diagnosis not present

## 2023-06-21 DIAGNOSIS — M6281 Muscle weakness (generalized): Secondary | ICD-10-CM | POA: Diagnosis not present

## 2023-06-21 DIAGNOSIS — R1312 Dysphagia, oropharyngeal phase: Secondary | ICD-10-CM | POA: Diagnosis not present

## 2023-06-21 DIAGNOSIS — F0392 Unspecified dementia, unspecified severity, with psychotic disturbance: Secondary | ICD-10-CM | POA: Diagnosis not present

## 2023-06-21 DIAGNOSIS — R278 Other lack of coordination: Secondary | ICD-10-CM | POA: Diagnosis not present

## 2023-06-21 DIAGNOSIS — K922 Gastrointestinal hemorrhage, unspecified: Secondary | ICD-10-CM | POA: Diagnosis not present

## 2023-06-22 DIAGNOSIS — K922 Gastrointestinal hemorrhage, unspecified: Secondary | ICD-10-CM | POA: Diagnosis not present

## 2023-06-22 DIAGNOSIS — R1312 Dysphagia, oropharyngeal phase: Secondary | ICD-10-CM | POA: Diagnosis not present

## 2023-06-22 DIAGNOSIS — R278 Other lack of coordination: Secondary | ICD-10-CM | POA: Diagnosis not present

## 2023-06-22 DIAGNOSIS — M6281 Muscle weakness (generalized): Secondary | ICD-10-CM | POA: Diagnosis not present

## 2023-06-22 DIAGNOSIS — F0392 Unspecified dementia, unspecified severity, with psychotic disturbance: Secondary | ICD-10-CM | POA: Diagnosis not present

## 2023-06-23 DIAGNOSIS — R278 Other lack of coordination: Secondary | ICD-10-CM | POA: Diagnosis not present

## 2023-06-23 DIAGNOSIS — M6281 Muscle weakness (generalized): Secondary | ICD-10-CM | POA: Diagnosis not present

## 2023-06-23 DIAGNOSIS — F0392 Unspecified dementia, unspecified severity, with psychotic disturbance: Secondary | ICD-10-CM | POA: Diagnosis not present

## 2023-06-23 DIAGNOSIS — K922 Gastrointestinal hemorrhage, unspecified: Secondary | ICD-10-CM | POA: Diagnosis not present

## 2023-06-23 DIAGNOSIS — R1312 Dysphagia, oropharyngeal phase: Secondary | ICD-10-CM | POA: Diagnosis not present

## 2023-06-24 DIAGNOSIS — N390 Urinary tract infection, site not specified: Secondary | ICD-10-CM | POA: Diagnosis not present

## 2023-06-24 DIAGNOSIS — F0392 Unspecified dementia, unspecified severity, with psychotic disturbance: Secondary | ICD-10-CM | POA: Diagnosis not present

## 2023-06-24 DIAGNOSIS — I4891 Unspecified atrial fibrillation: Secondary | ICD-10-CM | POA: Diagnosis not present

## 2023-06-24 DIAGNOSIS — R1312 Dysphagia, oropharyngeal phase: Secondary | ICD-10-CM | POA: Diagnosis not present

## 2023-06-24 DIAGNOSIS — I10 Essential (primary) hypertension: Secondary | ICD-10-CM | POA: Diagnosis not present

## 2023-06-24 DIAGNOSIS — M6281 Muscle weakness (generalized): Secondary | ICD-10-CM | POA: Diagnosis not present

## 2023-06-24 DIAGNOSIS — K922 Gastrointestinal hemorrhage, unspecified: Secondary | ICD-10-CM | POA: Diagnosis not present

## 2023-06-24 DIAGNOSIS — R278 Other lack of coordination: Secondary | ICD-10-CM | POA: Diagnosis not present

## 2023-06-24 DIAGNOSIS — E119 Type 2 diabetes mellitus without complications: Secondary | ICD-10-CM | POA: Diagnosis not present

## 2023-06-25 DIAGNOSIS — M6281 Muscle weakness (generalized): Secondary | ICD-10-CM | POA: Diagnosis not present

## 2023-06-25 DIAGNOSIS — R278 Other lack of coordination: Secondary | ICD-10-CM | POA: Diagnosis not present

## 2023-06-25 DIAGNOSIS — R1312 Dysphagia, oropharyngeal phase: Secondary | ICD-10-CM | POA: Diagnosis not present

## 2023-06-25 DIAGNOSIS — K922 Gastrointestinal hemorrhage, unspecified: Secondary | ICD-10-CM | POA: Diagnosis not present

## 2023-06-25 DIAGNOSIS — F0392 Unspecified dementia, unspecified severity, with psychotic disturbance: Secondary | ICD-10-CM | POA: Diagnosis not present

## 2023-06-26 DIAGNOSIS — E119 Type 2 diabetes mellitus without complications: Secondary | ICD-10-CM | POA: Diagnosis not present

## 2023-06-26 DIAGNOSIS — I10 Essential (primary) hypertension: Secondary | ICD-10-CM | POA: Diagnosis not present

## 2023-06-26 DIAGNOSIS — I4891 Unspecified atrial fibrillation: Secondary | ICD-10-CM | POA: Diagnosis not present

## 2023-06-26 DIAGNOSIS — N390 Urinary tract infection, site not specified: Secondary | ICD-10-CM | POA: Diagnosis not present

## 2023-06-28 DIAGNOSIS — R1312 Dysphagia, oropharyngeal phase: Secondary | ICD-10-CM | POA: Diagnosis not present

## 2023-06-28 DIAGNOSIS — M6281 Muscle weakness (generalized): Secondary | ICD-10-CM | POA: Diagnosis not present

## 2023-06-28 DIAGNOSIS — R278 Other lack of coordination: Secondary | ICD-10-CM | POA: Diagnosis not present

## 2023-06-28 DIAGNOSIS — K922 Gastrointestinal hemorrhage, unspecified: Secondary | ICD-10-CM | POA: Diagnosis not present

## 2023-06-28 DIAGNOSIS — F0392 Unspecified dementia, unspecified severity, with psychotic disturbance: Secondary | ICD-10-CM | POA: Diagnosis not present

## 2023-06-29 DIAGNOSIS — F0392 Unspecified dementia, unspecified severity, with psychotic disturbance: Secondary | ICD-10-CM | POA: Diagnosis not present

## 2023-06-29 DIAGNOSIS — R059 Cough, unspecified: Secondary | ICD-10-CM | POA: Diagnosis not present

## 2023-06-29 DIAGNOSIS — M6281 Muscle weakness (generalized): Secondary | ICD-10-CM | POA: Diagnosis not present

## 2023-06-29 DIAGNOSIS — R1312 Dysphagia, oropharyngeal phase: Secondary | ICD-10-CM | POA: Diagnosis not present

## 2023-06-29 DIAGNOSIS — K922 Gastrointestinal hemorrhage, unspecified: Secondary | ICD-10-CM | POA: Diagnosis not present

## 2023-06-29 DIAGNOSIS — R109 Unspecified abdominal pain: Secondary | ICD-10-CM | POA: Diagnosis not present

## 2023-06-29 DIAGNOSIS — R278 Other lack of coordination: Secondary | ICD-10-CM | POA: Diagnosis not present

## 2023-06-30 DIAGNOSIS — I10 Essential (primary) hypertension: Secondary | ICD-10-CM | POA: Diagnosis not present

## 2023-06-30 DIAGNOSIS — N390 Urinary tract infection, site not specified: Secondary | ICD-10-CM | POA: Diagnosis not present

## 2023-06-30 DIAGNOSIS — E119 Type 2 diabetes mellitus without complications: Secondary | ICD-10-CM | POA: Diagnosis not present

## 2023-06-30 DIAGNOSIS — Z79899 Other long term (current) drug therapy: Secondary | ICD-10-CM | POA: Diagnosis not present

## 2023-07-01 DIAGNOSIS — N390 Urinary tract infection, site not specified: Secondary | ICD-10-CM | POA: Diagnosis not present

## 2023-07-01 DIAGNOSIS — M6281 Muscle weakness (generalized): Secondary | ICD-10-CM | POA: Diagnosis not present

## 2023-07-01 DIAGNOSIS — E119 Type 2 diabetes mellitus without complications: Secondary | ICD-10-CM | POA: Diagnosis not present

## 2023-07-01 DIAGNOSIS — R1312 Dysphagia, oropharyngeal phase: Secondary | ICD-10-CM | POA: Diagnosis not present

## 2023-07-01 DIAGNOSIS — R278 Other lack of coordination: Secondary | ICD-10-CM | POA: Diagnosis not present

## 2023-07-01 DIAGNOSIS — K922 Gastrointestinal hemorrhage, unspecified: Secondary | ICD-10-CM | POA: Diagnosis not present

## 2023-07-01 DIAGNOSIS — F0392 Unspecified dementia, unspecified severity, with psychotic disturbance: Secondary | ICD-10-CM | POA: Diagnosis not present

## 2023-07-01 DIAGNOSIS — I10 Essential (primary) hypertension: Secondary | ICD-10-CM | POA: Diagnosis not present

## 2023-07-01 DIAGNOSIS — Z79899 Other long term (current) drug therapy: Secondary | ICD-10-CM | POA: Diagnosis not present

## 2023-07-02 DIAGNOSIS — K922 Gastrointestinal hemorrhage, unspecified: Secondary | ICD-10-CM | POA: Diagnosis not present

## 2023-07-02 DIAGNOSIS — R278 Other lack of coordination: Secondary | ICD-10-CM | POA: Diagnosis not present

## 2023-07-02 DIAGNOSIS — M6281 Muscle weakness (generalized): Secondary | ICD-10-CM | POA: Diagnosis not present

## 2023-07-02 DIAGNOSIS — F0392 Unspecified dementia, unspecified severity, with psychotic disturbance: Secondary | ICD-10-CM | POA: Diagnosis not present

## 2023-07-02 DIAGNOSIS — R1312 Dysphagia, oropharyngeal phase: Secondary | ICD-10-CM | POA: Diagnosis not present

## 2023-07-03 DIAGNOSIS — N390 Urinary tract infection, site not specified: Secondary | ICD-10-CM | POA: Diagnosis not present

## 2023-07-03 DIAGNOSIS — Z79899 Other long term (current) drug therapy: Secondary | ICD-10-CM | POA: Diagnosis not present

## 2023-07-03 DIAGNOSIS — E119 Type 2 diabetes mellitus without complications: Secondary | ICD-10-CM | POA: Diagnosis not present

## 2023-07-03 DIAGNOSIS — I10 Essential (primary) hypertension: Secondary | ICD-10-CM | POA: Diagnosis not present

## 2023-07-03 DIAGNOSIS — I4891 Unspecified atrial fibrillation: Secondary | ICD-10-CM | POA: Diagnosis not present

## 2023-07-05 DIAGNOSIS — R278 Other lack of coordination: Secondary | ICD-10-CM | POA: Diagnosis not present

## 2023-07-05 DIAGNOSIS — M6281 Muscle weakness (generalized): Secondary | ICD-10-CM | POA: Diagnosis not present

## 2023-07-05 DIAGNOSIS — K922 Gastrointestinal hemorrhage, unspecified: Secondary | ICD-10-CM | POA: Diagnosis not present

## 2023-07-05 DIAGNOSIS — N390 Urinary tract infection, site not specified: Secondary | ICD-10-CM | POA: Diagnosis not present

## 2023-07-05 DIAGNOSIS — B964 Proteus (mirabilis) (morganii) as the cause of diseases classified elsewhere: Secondary | ICD-10-CM | POA: Diagnosis not present

## 2023-07-05 DIAGNOSIS — F0392 Unspecified dementia, unspecified severity, with psychotic disturbance: Secondary | ICD-10-CM | POA: Diagnosis not present

## 2023-07-05 DIAGNOSIS — R1312 Dysphagia, oropharyngeal phase: Secondary | ICD-10-CM | POA: Diagnosis not present

## 2023-07-06 DIAGNOSIS — K922 Gastrointestinal hemorrhage, unspecified: Secondary | ICD-10-CM | POA: Diagnosis not present

## 2023-07-06 DIAGNOSIS — F0392 Unspecified dementia, unspecified severity, with psychotic disturbance: Secondary | ICD-10-CM | POA: Diagnosis not present

## 2023-07-06 DIAGNOSIS — R278 Other lack of coordination: Secondary | ICD-10-CM | POA: Diagnosis not present

## 2023-07-06 DIAGNOSIS — M6281 Muscle weakness (generalized): Secondary | ICD-10-CM | POA: Diagnosis not present

## 2023-07-06 DIAGNOSIS — R1312 Dysphagia, oropharyngeal phase: Secondary | ICD-10-CM | POA: Diagnosis not present

## 2023-07-08 DIAGNOSIS — F0392 Unspecified dementia, unspecified severity, with psychotic disturbance: Secondary | ICD-10-CM | POA: Diagnosis not present

## 2023-07-08 DIAGNOSIS — R1312 Dysphagia, oropharyngeal phase: Secondary | ICD-10-CM | POA: Diagnosis not present

## 2023-07-08 DIAGNOSIS — K922 Gastrointestinal hemorrhage, unspecified: Secondary | ICD-10-CM | POA: Diagnosis not present

## 2023-07-08 DIAGNOSIS — M6281 Muscle weakness (generalized): Secondary | ICD-10-CM | POA: Diagnosis not present

## 2023-07-08 DIAGNOSIS — R278 Other lack of coordination: Secondary | ICD-10-CM | POA: Diagnosis not present

## 2024-03-13 DEATH — deceased
